# Patient Record
Sex: Female | Born: 1940 | Race: White | Hispanic: No | Marital: Married | Smoking: Former smoker
Health system: Southern US, Community
[De-identification: ages and names within clinical notes are randomized; demographics above are authoritative.]

## PROBLEM LIST (undated history)

## (undated) DIAGNOSIS — R0789 Other chest pain: Secondary | ICD-10-CM

## (undated) DIAGNOSIS — Z853 Personal history of malignant neoplasm of breast: Secondary | ICD-10-CM

## (undated) DIAGNOSIS — K219 Gastro-esophageal reflux disease without esophagitis: Secondary | ICD-10-CM

## (undated) DIAGNOSIS — E559 Vitamin D deficiency, unspecified: Secondary | ICD-10-CM

## (undated) DIAGNOSIS — R93421 Abnormal radiologic findings on diagnostic imaging of right kidney: Secondary | ICD-10-CM

## (undated) DIAGNOSIS — I700 Atherosclerosis of aorta: Secondary | ICD-10-CM

## (undated) DIAGNOSIS — I10 Essential (primary) hypertension: Secondary | ICD-10-CM

## (undated) DIAGNOSIS — G4700 Insomnia, unspecified: Secondary | ICD-10-CM

## (undated) DIAGNOSIS — I2089 Other forms of angina pectoris: Secondary | ICD-10-CM

## (undated) DIAGNOSIS — M48061 Spinal stenosis, lumbar region without neurogenic claudication: Secondary | ICD-10-CM

## (undated) DIAGNOSIS — R0609 Other forms of dyspnea: Secondary | ICD-10-CM

## (undated) DIAGNOSIS — C50919 Malignant neoplasm of unspecified site of unspecified female breast: Secondary | ICD-10-CM

## (undated) DIAGNOSIS — M7989 Other specified soft tissue disorders: Secondary | ICD-10-CM

## (undated) DIAGNOSIS — E039 Hypothyroidism, unspecified: Secondary | ICD-10-CM

## (undated) DIAGNOSIS — M1990 Unspecified osteoarthritis, unspecified site: Secondary | ICD-10-CM

## (undated) DIAGNOSIS — C801 Malignant (primary) neoplasm, unspecified: Secondary | ICD-10-CM

## (undated) DIAGNOSIS — E785 Hyperlipidemia, unspecified: Secondary | ICD-10-CM

## (undated) DIAGNOSIS — Z923 Personal history of irradiation: Secondary | ICD-10-CM

## (undated) DIAGNOSIS — E669 Obesity, unspecified: Secondary | ICD-10-CM

## (undated) HISTORY — DX: Other forms of dyspnea: R06.09

## (undated) HISTORY — DX: Spinal stenosis, lumbar region without neurogenic claudication: M48.061

## (undated) HISTORY — DX: Other forms of angina pectoris: I20.89

## (undated) HISTORY — DX: Malignant neoplasm of unspecified site of unspecified female breast: C50.919

## (undated) HISTORY — PX: JOINT REPLACEMENT: SHX530

## (undated) HISTORY — DX: Other specified soft tissue disorders: M79.89

## (undated) HISTORY — DX: Vitamin D deficiency, unspecified: E55.9

## (undated) HISTORY — DX: Personal history of malignant neoplasm of breast: Z85.3

## (undated) HISTORY — DX: Abnormal radiologic findings on diagnostic imaging of right kidney: R93.421

## (undated) HISTORY — DX: Hypothyroidism, unspecified: E03.9

## (undated) HISTORY — DX: Hyperlipidemia, unspecified: E78.5

## (undated) HISTORY — DX: Essential (primary) hypertension: I10

## (undated) HISTORY — DX: Gastro-esophageal reflux disease without esophagitis: K21.9

## (undated) HISTORY — DX: Other chest pain: R07.89

## (undated) HISTORY — DX: Unspecified osteoarthritis, unspecified site: M19.90

## (undated) HISTORY — DX: Obesity, unspecified: E66.9

## (undated) HISTORY — DX: Malignant (primary) neoplasm, unspecified: C80.1

## (undated) HISTORY — DX: Atherosclerosis of aorta: I70.0

## (undated) HISTORY — DX: Insomnia, unspecified: G47.00

---

## 1958-07-21 HISTORY — PX: ANTERIOR CRUCIATE LIGAMENT REPAIR: SHX115

## 1960-07-21 HISTORY — PX: APPENDECTOMY: SHX54

## 1969-07-21 HISTORY — PX: CHOLECYSTECTOMY: SHX55

## 1978-07-21 HISTORY — PX: TUBAL LIGATION: SHX77

## 1998-06-27 ENCOUNTER — Other Ambulatory Visit: Admission: RE | Admit: 1998-06-27 | Discharge: 1998-06-27 | Payer: Self-pay | Admitting: Obstetrics and Gynecology

## 1999-08-16 ENCOUNTER — Other Ambulatory Visit: Admission: RE | Admit: 1999-08-16 | Discharge: 1999-08-16 | Payer: Self-pay | Admitting: Obstetrics and Gynecology

## 1999-11-20 ENCOUNTER — Encounter (INDEPENDENT_AMBULATORY_CARE_PROVIDER_SITE_OTHER): Payer: Self-pay

## 1999-11-20 ENCOUNTER — Other Ambulatory Visit: Admission: RE | Admit: 1999-11-20 | Discharge: 1999-11-20 | Payer: Self-pay | Admitting: Obstetrics and Gynecology

## 2000-08-18 ENCOUNTER — Other Ambulatory Visit: Admission: RE | Admit: 2000-08-18 | Discharge: 2000-08-18 | Payer: Self-pay | Admitting: Obstetrics and Gynecology

## 2001-10-19 ENCOUNTER — Other Ambulatory Visit: Admission: RE | Admit: 2001-10-19 | Discharge: 2001-10-19 | Payer: Self-pay | Admitting: Obstetrics and Gynecology

## 2002-10-24 ENCOUNTER — Encounter: Payer: Self-pay | Admitting: Internal Medicine

## 2002-10-24 ENCOUNTER — Encounter: Admission: RE | Admit: 2002-10-24 | Discharge: 2002-10-24 | Payer: Self-pay | Admitting: Internal Medicine

## 2002-11-18 ENCOUNTER — Encounter: Admission: RE | Admit: 2002-11-18 | Discharge: 2002-11-18 | Payer: Self-pay | Admitting: Internal Medicine

## 2002-11-18 ENCOUNTER — Encounter: Payer: Self-pay | Admitting: Internal Medicine

## 2003-04-18 ENCOUNTER — Other Ambulatory Visit: Admission: RE | Admit: 2003-04-18 | Discharge: 2003-04-18 | Payer: Self-pay | Admitting: Obstetrics and Gynecology

## 2004-11-08 ENCOUNTER — Ambulatory Visit: Payer: Self-pay | Admitting: Gastroenterology

## 2004-11-19 ENCOUNTER — Ambulatory Visit: Payer: Self-pay | Admitting: Gastroenterology

## 2005-01-29 ENCOUNTER — Ambulatory Visit: Payer: Self-pay | Admitting: Gastroenterology

## 2005-10-17 ENCOUNTER — Encounter: Admission: RE | Admit: 2005-10-17 | Discharge: 2005-10-17 | Payer: Self-pay | Admitting: Pulmonary Disease

## 2005-12-26 ENCOUNTER — Ambulatory Visit: Payer: Self-pay | Admitting: Gastroenterology

## 2006-06-05 ENCOUNTER — Encounter: Admission: RE | Admit: 2006-06-05 | Discharge: 2006-06-05 | Payer: Self-pay | Admitting: Internal Medicine

## 2008-01-31 ENCOUNTER — Encounter: Admission: RE | Admit: 2008-01-31 | Discharge: 2008-01-31 | Payer: Self-pay | Admitting: Pulmonary Disease

## 2008-10-18 ENCOUNTER — Encounter (INDEPENDENT_AMBULATORY_CARE_PROVIDER_SITE_OTHER): Payer: Self-pay | Admitting: *Deleted

## 2009-03-08 ENCOUNTER — Encounter: Admission: RE | Admit: 2009-03-08 | Discharge: 2009-03-08 | Payer: Self-pay | Admitting: Pulmonary Disease

## 2009-03-08 ENCOUNTER — Ambulatory Visit: Payer: Self-pay | Admitting: Internal Medicine

## 2009-03-30 ENCOUNTER — Ambulatory Visit: Payer: Self-pay | Admitting: Internal Medicine

## 2010-01-27 ENCOUNTER — Emergency Department (HOSPITAL_COMMUNITY): Admission: EM | Admit: 2010-01-27 | Discharge: 2010-01-27 | Payer: Self-pay | Admitting: Emergency Medicine

## 2010-08-11 ENCOUNTER — Encounter: Payer: Self-pay | Admitting: Gastroenterology

## 2010-08-11 ENCOUNTER — Encounter: Payer: Self-pay | Admitting: Pulmonary Disease

## 2010-08-27 ENCOUNTER — Encounter (HOSPITAL_COMMUNITY): Payer: Medicare Other | Attending: Orthopedic Surgery

## 2010-08-27 DIAGNOSIS — Z01818 Encounter for other preprocedural examination: Secondary | ICD-10-CM | POA: Insufficient documentation

## 2010-08-27 LAB — COMPREHENSIVE METABOLIC PANEL
Alkaline Phosphatase: 61 U/L (ref 39–117)
BUN: 11 mg/dL (ref 6–23)
CO2: 29 mEq/L (ref 19–32)
Calcium: 9.8 mg/dL (ref 8.4–10.5)
Chloride: 103 mEq/L (ref 96–112)
GFR calc non Af Amer: >60 mL/min (ref >60)
Sodium: 140 mEq/L (ref 135–145)

## 2010-08-27 LAB — PROTIME-INR
INR: 0.97 (ref 0.00–1.49)
Prothrombin Time: 13.1 seconds (ref 11.6–15.2)

## 2010-08-27 LAB — URINALYSIS, ROUTINE W REFLEX MICROSCOPIC
Nitrite: NEGATIVE
Protein, ur: NEGATIVE mg/dL
Specific Gravity, Urine: 1.012 (ref 1.005–1.030)
Urine Glucose, Fasting: NEGATIVE mg/dL
Urobilinogen, UA: 0.2 mg/dL (ref 0.0–1.0)

## 2010-08-27 LAB — SURGICAL PCR SCREEN: Staphylococcus aureus: NEGATIVE

## 2010-08-27 LAB — CBC
HCT: 38.5 % (ref 36.0–46.0)
MCH: 30.9 pg (ref 26.0–34.0)
MCV: 89.5 fL (ref 78.0–100.0)
Platelets: 204 10*3/uL (ref 150–400)

## 2010-09-06 ENCOUNTER — Inpatient Hospital Stay (HOSPITAL_COMMUNITY)
Admission: RE | Admit: 2010-09-06 | Discharge: 2010-09-11 | DRG: 462 | Disposition: A | Discharge status: Skilled Nursing Facility | Payer: Medicare Other | Attending: Orthopedic Surgery | Admitting: Orthopedic Surgery

## 2010-09-06 DIAGNOSIS — D62 Acute posthemorrhagic anemia: Secondary | ICD-10-CM | POA: Diagnosis not present

## 2010-09-06 DIAGNOSIS — E871 Hypo-osmolality and hyponatremia: Secondary | ICD-10-CM | POA: Diagnosis not present

## 2010-09-06 DIAGNOSIS — M1710 Unilateral primary osteoarthritis, unspecified knee: Principal | ICD-10-CM | POA: Diagnosis present

## 2010-09-06 DIAGNOSIS — D696 Thrombocytopenia, unspecified: Secondary | ICD-10-CM | POA: Diagnosis not present

## 2010-09-06 DIAGNOSIS — E039 Hypothyroidism, unspecified: Secondary | ICD-10-CM | POA: Diagnosis present

## 2010-09-06 DIAGNOSIS — I10 Essential (primary) hypertension: Secondary | ICD-10-CM | POA: Diagnosis present

## 2010-09-06 DIAGNOSIS — E7800 Pure hypercholesterolemia, unspecified: Secondary | ICD-10-CM | POA: Diagnosis present

## 2010-09-06 HISTORY — PX: REPLACEMENT TOTAL KNEE BILATERAL: SUR1225

## 2010-09-07 LAB — CBC
HCT: 27.8 % — ABNORMAL LOW (ref 36.0–46.0)
MCH: 29.8 pg (ref 26.0–34.0)
MCV: 91.1 fL (ref 78.0–100.0)
Platelets: 123 10*3/uL — ABNORMAL LOW (ref 150–400)
RBC: 3.05 MIL/uL — ABNORMAL LOW (ref 3.87–5.11)

## 2010-09-07 LAB — BASIC METABOLIC PANEL
BUN: 15 mg/dL (ref 6–23)
GFR calc non Af Amer: >60 mL/min (ref >60)
Glucose, Bld: 182 mg/dL — ABNORMAL HIGH (ref 70–99)

## 2010-09-07 LAB — PROTIME-INR: Prothrombin Time: 14 seconds (ref 11.6–15.2)

## 2010-09-08 LAB — PREPARE RBC (CROSSMATCH)

## 2010-09-08 LAB — CBC
HCT: 20.8 % — ABNORMAL LOW (ref 36.0–46.0)
Platelets: 89 10*3/uL — ABNORMAL LOW (ref 150–400)
RBC: 2.28 MIL/uL — ABNORMAL LOW (ref 3.87–5.11)
RDW: 13.5 % (ref 11.5–15.5)

## 2010-09-08 LAB — BASIC METABOLIC PANEL
Calcium: 7.8 mg/dL — ABNORMAL LOW (ref 8.4–10.5)
Creatinine, Ser: 0.67 mg/dL (ref 0.4–1.2)
GFR calc Af Amer: >60 mL/min (ref >60)
GFR calc non Af Amer: >60 mL/min (ref >60)
Glucose, Bld: 186 mg/dL — ABNORMAL HIGH (ref 70–99)

## 2010-09-08 LAB — PROTIME-INR: Prothrombin Time: 14.7 seconds (ref 11.6–15.2)

## 2010-09-09 LAB — BASIC METABOLIC PANEL
Chloride: 100 mEq/L (ref 96–112)
Creatinine, Ser: 0.66 mg/dL (ref 0.4–1.2)
Glucose, Bld: 197 mg/dL — ABNORMAL HIGH (ref 70–99)
Potassium: 4 mEq/L (ref 3.5–5.1)
Sodium: 134 mEq/L — ABNORMAL LOW (ref 135–145)

## 2010-09-09 LAB — CBC
HCT: 27.2 % — ABNORMAL LOW (ref 36.0–46.0)
MCHC: 33.8 g/dL (ref 30.0–36.0)
Platelets: 100 10*3/uL — ABNORMAL LOW (ref 150–400)
RDW: 14 % (ref 11.5–15.5)
WBC: 7.3 10*3/uL (ref 4.0–10.5)

## 2010-09-09 LAB — TYPE AND SCREEN
ABO/RH(D): A POS
Unit division: 0

## 2010-09-09 LAB — PROTIME-INR
INR: 1.06 (ref 0.00–1.49)
Prothrombin Time: 14 seconds (ref 11.6–15.2)

## 2010-09-10 LAB — BASIC METABOLIC PANEL
BUN: 5 mg/dL — ABNORMAL LOW (ref 6–23)
CO2: 31 mEq/L (ref 19–32)
Calcium: 8.3 mg/dL — ABNORMAL LOW (ref 8.4–10.5)
Creatinine, Ser: 0.65 mg/dL (ref 0.4–1.2)
GFR calc Af Amer: >60 mL/min (ref >60)
Glucose, Bld: 163 mg/dL — ABNORMAL HIGH (ref 70–99)

## 2010-09-10 LAB — CBC
Hemoglobin: 9.5 g/dL — ABNORMAL LOW (ref 12.0–15.0)
MCH: 30.4 pg (ref 26.0–34.0)
MCHC: 33.8 g/dL (ref 30.0–36.0)
MCV: 89.8 fL (ref 78.0–100.0)

## 2010-09-10 LAB — PROTIME-INR: INR: 1.08 (ref 0.00–1.49)

## 2010-09-11 LAB — CBC
MCH: 30.2 pg (ref 26.0–34.0)
MCHC: 33.1 g/dL (ref 30.0–36.0)
MCV: 91.4 fL (ref 78.0–100.0)
Platelets: 160 10*3/uL (ref 150–400)
RBC: 3.01 MIL/uL — ABNORMAL LOW (ref 3.87–5.11)
RDW: 13.8 % (ref 11.5–15.5)

## 2010-09-11 LAB — PROTIME-INR: Prothrombin Time: 15.7 seconds — ABNORMAL HIGH (ref 11.6–15.2)

## 2010-09-11 NOTE — H&P (Signed)
NAME:  Dawn Bradley, Dawn Bradley             ACCOUNT NO.:  [id number]  MEDICAL RECORD NO.:  [id number]         PATIENT TYPE:  LINP  LOCATION:  1609                         FACILITY:  [facility]  PHYSICIAN:  Ollen Gross, M.D.    DATE OF BIRTH:  [birth year]  DATE OF ADMISSION:  09/06/2010 DATE OF DISCHARGE:                             HISTORY & PHYSICAL   CHIEF COMPLAINT:  Bilateral knee pain.  HISTORY OF PRESENT ILLNESS:  The patient is a 70 year old female who has been seen by Dr. Lequita Halt for ongoing bilateral knee pain.  The knee has been progressively getting worse for quite some time now.  She has advanced arthritis.  The left is a little worse than the right.  The right is also bone-on-bone, though.  It was felt she would be a good candidate for surgical intervention.  She was originally scheduled to have the left total knee done, however, after further discussion, she would like to have both knees done at the same time.  Risks and benefits have been discussed and she elected to proceed with surgery.  She has been seen preoperatively by Dr. Jacky Kindle and felt to be stable for surgery.  ALLERGIES:  CODEINE derivatives.  FOOD ALLERGY:  SHELLFISH (she does not have any specific allergy to Betadine or iodine though).  CURRENT MEDICATIONS:  Lisinopril, simvastatin, Synthroid, generic Ambien, multivitamin, vitamin D, and Align.  PAST MEDICAL HISTORY: 1. Past history of bronchitis. 2. Past history of pneumonia. 3. Hypertension. 4. Hypercholesterolemia. 5. History of fatty liver. 6. Hypothyroidism. 7. Postmenopausal. 8. History of renal calculi.  PAST SURGICAL HISTORY:  Gallbladder surgery; ruptured appendix, resulting peritonitis; left knee surgery; and tubal ligation.  FAMILY HISTORY:  Father deceased, age 66, kidney cancer.  Mother deceased, age 23, natural causes.  Sister with breast cancer.  SOCIAL HISTORY:  Married.  Retired Veterinary surgeon.  Past smoker.  One drink twice a week.  Three  children.  Her spouse will be assisting with care after surgery.  She lives in a two-story home, two steps entering.  She does have a living will.  REVIEW OF SYSTEMS:  GENERAL:  No fever, chills, or night sweats.  NEURO: No seizure, syncope, or paralysis.  RESPIRATORY:  No shortness breath, productive cough, or hemoptysis.  CARDIOVASCULAR:  No chest pain, angina, orthopnea.  GI:  No nausea, vomiting, diarrhea, or constipation. GU:  No dysuria, hematuria.  Occasional nocturia.  MUSCULOSKELETAL: Knee pain.  PHYSICAL EXAMINATION:  VITAL SIGNS:  Pulse 64, respirations 12, blood pressure 142/78. GENERAL:  A 70 year old white female, well nourished, well developed, overweight, in no acute distress.  She is alert, oriented cooperative, pleasant, excellent historian, accompanied by her husband. HEENT:  Normocephalic, atraumatic.  Pupils are round and reactive.  EOMs intact.  Never wore glasses. NECK:  Supple. CHEST:  Clear. HEART:  Regular rate and rhythm without murmur. ABDOMEN:  Soft, round, protuberant abdomen.  Bowel sounds present. RECTAL:  Not done, not pertinent to present illness. BREASTS:  Not done, not pertinent to present illness. GENITALIA:  Not done, not pertinent to present illness. EXTREMITIES:  Right knee, no effusion, marked crepitus, no instability. Left knee, no effusion, marked  crepitus, no instability.  IMPRESSION:  Osteoarthritis, bilateral knees.  PLAN:  The patient will be admitted to [facility] to undergo bilateral total knee replacement arthroplasty.  Surgery will be performed by Dr. Ollen Gross.     Alexzandrew L. Julien Girt, P.A.C.   ______________________________ Ollen Gross, M.D.    ALP/MEDQ  D:  09/05/2010  T:  09/06/2010  Job:  [id number]  cc:   Gerlene Burdock M.D. Jacky Kindle  Electronically Signed by Patrica Duel P.A.C. on 09/10/2010 07:48:49 AM Electronically Signed by Ollen Gross M.D. on 09/11/2010 04:48:21 PM

## 2010-09-11 NOTE — Op Note (Signed)
NAME:  Dawn Bradley, Dawn Bradley             ACCOUNT NO.:  [id number]  MEDICAL RECORD NO.:  [id number]           PATIENT TYPE:  I  LOCATION:  0005                         FACILITY:  [facility]  PHYSICIAN:  Ollen Gross, M.D.    DATE OF BIRTH:  1941-03-05  DATE OF PROCEDURE:  09/06/2010 DATE OF DISCHARGE:                              OPERATIVE REPORT   PREOPERATIVE DIAGNOSIS:  Osteoarthritis, bilateral knees.  POSTOPERATIVE DIAGNOSIS:  Osteoarthritis, bilateral knees.  PROCEDURE:  Bilateral total knee arthroplasty.  SURGEON:  Ollen Gross, MD  ASSISTANT:  Alexzandrew L. Perkins, PA-C  ANESTHESIA:  Combined spinal epidural.  ESTIMATED BLOOD LOSS:  Minimal.  DRAIN:  Autovac x1 each side.  TOURNIQUET TIME:  Right 39 minutes at 300 mmHg and left 39 minutes at 300 mmHg.  COMPLICATIONS:  None.  CONDITION:  Stable to Recovery.  BRIEF CLINICAL NOTE:  Dawn Bradley is a 70 year old female with advanced end-stage arthritis of both knees with progressively worsening pain and dysfunction.  The left has been worse for many years and has had more longstanding arthritis.  She opts for bilateral total knee arthroplasty.  We discussed doing the left first since it is more symptomatic.  PROCEDURE IN DETAIL:  After successful initiation of combined spinal and epidural anesthetic, tourniquet was placed high on both thighs.  Both lower extremities prepped and draped in usual sterile fashion.  Left lower extremity was dressed first.  The left lower extremity was wrapped in an Esmarch, knee flexed, tourniquet inflated to 300 mmHg.  Midline incision was made with a 10 blade through subcutaneous tissue to the level of the extensor mechanism.  Fresh blade was used to make a medial parapatellar arthrotomy.  Soft tissue proximal medial tibia subperiosteally elevated to the joint line with the knife and into the semimembranosus bursa with a Cobb elevator.  Soft tissue laterally was elevated with attention  being paid to avoid patellar tendon on tibial tubercle.  Patella was everted, knee flexed to 90 degrees, and ACL and PCL removed.  Drill was used to create a starting hole in the distal femur and the canal was thoroughly irrigated.  A 5-degree left valgus alignment guide was placed and distal femoral cutting block was pinned to remove 11 mm off the distal femur.  Distal femoral resection was made with an oscillating saw.  The tibia subluxed forward and menisci removed.  Extramedullary tibial alignment guide was placed, referencing proximally at the medial aspect of the tibial tubercle and distally along the 2nd metatarsal axis and tibial crest.  Block was pinned to remove 2 mm off the more deficient medial side.  There was pretty significant deficiency in this tibia. The dissection was made with an oscillating saw.  Size 4 was the most appropriate tibial component and the proximal tibia was prepared with a modular drill and keel punched for the size 4.  Femoral sizer was placed, sized 4 narrow was most appropriate on the femur.  Rotation was marked at the epicondylar axis and confirmed by creating rectangular flexion gap at 90 degrees.  Size 4 cutting block was placed in this rotation and the anterior and posterior chamfer  cuts made.  Intercondylar block was placed and that cut was made.  Trial size 4 narrow posterior stabilized femur was placed.  A 12.5 posterior stabilized rotating platform insert trial was placed.  The 12.5 had a little bit play, so we went with a 15 which allowed for full extension with excellent varus-valgus and anterior-posterior balance, throughout full range of motion.  Patella was then everted and thickness measured to be 21 mm.  Freehand resection was taken to 12 mm, 35 template was placed, lug holes were drilled, trial patella was placed and it tracked normally.  Osteophytes were removed off the posterior femur with the trial in place.  All trials were  removed and the cut bone surfaces were prepared with pulsatile lavage.  Cement was mixed and once ready for implantation, a size 4 mobile-bearing tibial tray, size 4 narrow posterior stabilized femur, and 38 patella were cemented in place. Patella was held with a clamp.  Trial 15-mm insert was placed, knee held in full extension, all extruded cement removed.  When the cement was fully hardened, then the permanent 15 mm posterior stabilized rotating platform insert was placed in the tibial tray.  The wound was copiously irrigated with saline solution and then the arthrotomy closed over Autovac drain with interrupted #1 PDS.  Flexion against gravity was about 110 degrees.  Preop, she was only 45 degrees of flexion.  The tourniquet was then released at total time of 39 minutes.  DICTATION ENDED AT THIS POINT.     Ollen Gross, M.D.     FA/MEDQ  D:  09/06/2010  T:  09/06/2010  Job:  [id number]  Electronically Signed by Ollen Gross M.D. on 09/11/2010 04:48:17 PM

## 2010-09-11 NOTE — Op Note (Signed)
NAME:  Dawn Bradley, Dawn Bradley             ACCOUNT NO.:  [id number]  MEDICAL RECORD NO.:  [id number]           PATIENT TYPE:  I  LOCATION:  0005                         FACILITY:  [facility]  PHYSICIAN:  Ollen Gross, M.D.    DATE OF BIRTH:  1940-10-01  DATE OF PROCEDURE:  09/06/2010 DATE OF DISCHARGE:                              OPERATIVE REPORT   ADDENDUM:  The flexion against gravity was about 110 degrees.  Preop flexion was only about 50-60.  Subcutaneous was then closed with interrupted 2-0 Vicryl.  A tourniquet was released at total time of 39 minutes.  Subcuticular was closed with running 4-0 Monocryl.  Incision was cleaned and dried.  We then wrapped the right lower extremity in an Esmarch, flexed the knee, and inflated tourniquet to 300 mmHg.  Midline incision was made with a 10 blade through the subcutaneous tissue to the extensor mechanism.  Fresh blade was used to make a medial parapatellar arthrotomy.  Soft tissue on the proximal medial tibia subperiosteally elevated to the joint line with a knife and into the semimembranosus bursa with a Cobb elevator.  Soft tissue laterally was elevated with attention being paid to avoid any patellar tendon on tibial tubercle. Patella was everted, knee flexed 90 degrees, ACL and PCL removed.  Drill was used to create a starting hole and the distal femur canal was thoroughly irrigated.  A 5-degree right valgus alignment guide was placed.  Distal femoral cutting block was pinned to remove 11 mm off the distal femur.  Distal femoral resection was made with an oscillating saw.  The tibia subluxed forward menisci removed.  Extramedullary tibial alignment guide was placed, referencing proximally at the medial aspect of the tibial tubercle and distally along the 2nd metatarsal axis and tibial crest.  Block was pinned to remove 2 mm off the more deficient medial side.  Deficiency was not as bad as it was on the left.  Tibial resection was made with  an oscillating saw.  Size 3 was most appropriate tibia.  The proximal tibia was prepared with a modular drill and keel punched for the size 3.  Femoral sizing guide was placed, size three was most appropriate on the femur.  Rotation was marked at the epicondylar axis and confirmed by creating a rectangular flexion gap at 90 degrees.  Size 3 cutting block was placed in this rotation and the anterior and posterior chamfer cuts were made.  Intercondylar block was placed and that cut was made.  Trial size 3 posterior stabilized femur was placed.  A 10 mm posterior stabilized rotating platform insert trial was placed.  With the 10, full extension was achieved with excellent varus-valgus and anterior- posterior balance, throughout full range of motion.  Patella was everted, thickness measured to be 22 mm.  Free resection was taken to 12 mm, 38 template was placed, lug holes were drilled, trial patella was placed and it tracked normally.  Osteophytes were removed off the posterior femur with the trial in place.  All trials were removed andcut bone surfaces  were prepared with pulsatile lavage.  Cement was mixed and once ready for implantation, a  size 3 mobile-bearing tibial tray, size 3 posterior stabilized femur, and 38 patella were cemented into place.  The patella was held with a clamp.  Trial 10-mm insert was placed, knee held in full extension, all extruded cement removed.  When the cement was fully hardened, then the permanent 10 mm posterior stabilized rotating platform insert was placed in the tibial tray. Wound was copiously irrigated with saline solution and the arthrotomy closed over the Autovac drain with interrupted #1 PDS.  Flexion against gravity was 135 degrees and patella tracked normally.  Subcutaneous was closed with interrupted 2-0 Vicryl and subcuticular with running 4-0 Monocryl.  Note that the tourniquet was released after 39 minutes once we closed the deep layer.  A 4-0  Monocryl was used to close the subcuticular layer. Drain was hooked to suction.  Incision cleaned and dried.  Steri-Strips and a bulky sterile dressing applied.  Both sterile dressing was placed on the left also.  She was then placed into knee immobilizers, awakened, and transported to Recovery in stable condition.     Ollen Gross, M.D.     FA/MEDQ  D:  09/06/2010  T:  09/06/2010  Job:  [id number]  Electronically Signed by Ollen Gross M.D. on 09/11/2010 04:48:20 PM

## 2010-09-26 NOTE — Discharge Summary (Signed)
NAME:  SHYE, DOTY             ACCOUNT NO.:  [id number]  MEDICAL RECORD NO.:  [id number]           PATIENT TYPE:  I  LOCATION:  1609                         FACILITY:  [facility]  PHYSICIAN:  Ollen Gross, M.D.    DATE OF BIRTH:  05/03/1941  DATE OF ADMISSION:  09/06/2010 DATE OF DISCHARGE:  09/11/2010                        DISCHARGE SUMMARY - REFERRING   ADMITTING DIAGNOSES: 1. Bilateral knees osteoarthritis. 2. Past history of bronchitis. 3. Past history of pneumonia. 4. Hypertension. 5. Hypercholesterolemia. 6. History of fatty liver. 7. Hypothyroidism. 8. Postmenopausal. 9. History of renal calculi.  DISCHARGE DIAGNOSES: 1. Osteoarthritis bilateral knees, status post bilateral total knee     replacement arthroplasties. 2. Postop acute blood loss anemia. 3. Status post transfusion without sequelae. 4. Postop hyponatremia, improved. 5. Past history of bronchitis. 6. Past history of pneumonia. 7. Hypertension. 8. Hypercholesterolemia. 9. History of fatty liver. 10.Hypothyroidism. 11.Postmenopausal. 12.History of renal calculi.  PROCEDURE:  September 06, 2010, bilateral total knee replacement arthroplasty.  SURGEON:  Ollen Gross, MD  ASSISTANT:  Alexzandrew L. Perkins, P.A.C  ANESTHESIA:  Spinal anesthesia with epidural placement postoperatively.  TOURNIQUET TIMES:  39 on the right and 39 on the left.  CONSULTS:  Anesthesia for epidural management.  BRIEF HISTORY:  The patient is a 70 year old female known to Dr. Lequita Halt having known bilateral knees osteoarthritis.  This has been progressive in nature and felt that she would benefit undergoing surgical intervention.  The pros and cons of having bilateral knees done were discussed with the patient and she elected to proceed with bilateral knee replacements.  LABORATORY DATA:  CBC on admission hemoglobin 13.3, hematocrit 38.5, white cell count 6.6, platelets 204.  PT, INR 13.1 and 0.97 with a PTT of 38.   Chem panel on admission, slightly elevated glucose of 127. Remaining Chem panel all within normal limits.  Preop UA negative. Blood group type A positive.  Nasal swabs were negative.  Staph aureus negative for MRSA.  Serial CBCs were followed.  Hemoglobin dropped down to 9.1 and then 6.9.  She was given 2 units of blood.  Post transfusion, hemoglobin back up to 9.2.  Last hemoglobin 9.1 and hematocrit 27.5. Serial prothrombin times followed per Coumadin protocol.  Last PT, INR 15.7 and 1.23.  Serial BMETs were followed for 4 days.  Sodium did drop from 140-134 back up to 136.  Remaining electrolytes remained within normal limits.  Glucose run at 127-197 back down to 163.  EKG dated April 16, 2008, normal sinus rhythm, normal EKG.  Chest x-ray dated September 10, 2009, no active disease.  HOSPITAL COURSE:  The patient was admitted to [facility], taken to OR, underwent above-stated procedure without complication.  The patient tolerated procedure well.  She did have an epidural placed postoperatively for postoperative management.  She was later transferred to recovery room and then orthopedic floor for continued postop care. She was also given additional p.o. and IV analgesics.  She was given 24 hours postoperative antibiotics.  Her Coumadin, Lovenox were held temporarily since she had the epidural.  On day #1, she was doing fairly well with some pain control one leg, a  little bit more soreness in the epidural castration and epidural management was controlled by Anesthesia for better pain control.  She has got up to the bed to chair.  She did have some low platelets, so we monitored that.  Platelets dropped down to 89 postoperatively.  On postop day #2, she was seen by Anesthesia and the epidural was pulled.  The Coumadin was started that night before and then she was to be started on Lovenox postoperatively, but the platelets were low, so was held on day #2.  Her sodium dropped  down to 134, felt to be more of a dilutional component.  We had decreased the fluids when she was eating and drinking well.  She had excellent urinary output.  We left the Foley a little bit longer also because of her low hemoglobin. Unfortunately, had dropped on day #2 down to a level of 6.9 requiring 2 units of blood, so she was transfused and then Foley was left in for strict I's and O's management.  She had good output, so by day #3, the Foley was discontinued and we evaluated, her platelets were stable actually up from 89-100, so we started her on Lovenox 40 mg once a day until her INR was therapeutic.  She started getting up by this time and walking about short distances of 10-30 feet.  She did want to look into a skilled nursing facility, so we got the social work involved.  FL-2 was signed and sent out on Monday and bed offers were made.  On postop day #4, her platelets were increasing up to 126.  By this time, her sodium was stable and actually on day #2 and #3 it was a little low, but by day #4 it was back up to a normal level of 136.  She continued to progress well, although she was needing moderate assist due to having both the legs done and felt she would be an excellent candidate for rehab.  She was seen on postop day #5, September 11, 2010, by Dr. Lequita Halt and bed offers were made.  We are waiting on final bed decision, but at this point she is medically stable and we decided the patient be transferred to skilled nursing facility at that time.  DISCHARGE PLAN: 1. The patient will be transferred over to skilled nursing facility of     choice. 2. Discharge diagnoses, please see above.  DISCHARGE MEDICATIONS:  Current medications at time of transfer include: 1. Coumadin protocol.  Please titrate Coumadin level for target INR     between 2.0 and 3.0.  She is to be on Coumadin for 4 weeks     following discharge. 2. Colace 100 mg p.o. b.i.d. 3. Zocor 20 mg p.o. q.h.s. 4.  Levothyroxine 88 mcg p.o. q.h.s. 5. Pepcid 20 mg daily. 6. Lovenox 40 mg subcu injection daily.  Please note to discontinue     the Lovenox once the INR is 2.0 or greater. 7. MiraLax 17 g in 8 ounces of water daily. 8. Robaxin 500 mg p.o. q.6 hours p.r.n. spasm. 9. Tylenol 325 mg one or two every 4-6 hours as needed for mild pain,     temperature, or headache. 10.Restoril 15 mg to 30 mg p.o. q.h.s. p.r.n. sleep. 11.OxyIR 10 mg 1-2 tablets every 4 hours as needed for pain.  DIET:  Heart-healthy diet.  ACTIVITY:  She is weightbearing as tolerated.  Total knee protocol to both lower extremities, weightbearing as tolerated.  She needs to continue with  gait training, ambulation, ADLs, range of motion, and strengthening exercises for both total knee procedures.  She may start showering at this point at time of transfer, but do not submerge the incision under water.  FOLLOWUP:  She needs to follow with next week with Dr. Lequita Halt on either Tuesday, February 28th or Thursday, March 1st.  Please contact the office at [phone] to help arrange appointment and followup care, transfer this patient for further followup.  DISPOSITION:  Pending at time of dictation, bed offers have been made, waiting on final choice from the patient.  CONDITION ON DISCHARGE:  Improving.     Alexzandrew L. Julien Girt, P.A.C.   ______________________________ Ollen Gross, M.D.    ALP/MEDQ  D:  09/11/2010  T:  09/11/2010  Job:  [id number]  cc:   Dr. Geoffry Paradise  Electronically Signed by Patrica Duel P.A.C. on 09/12/2010 08:45:20 AM Electronically Signed by Ollen Gross M.D. on 09/25/2010 03:44:30 PM

## 2010-12-31 ENCOUNTER — Other Ambulatory Visit: Payer: Self-pay | Admitting: Orthopedic Surgery

## 2010-12-31 ENCOUNTER — Encounter (HOSPITAL_COMMUNITY): Payer: Medicare Other

## 2010-12-31 ENCOUNTER — Ambulatory Visit (HOSPITAL_COMMUNITY)
Admission: RE | Admit: 2010-12-31 | Discharge: 2010-12-31 | Disposition: A | Discharge status: Home / Self Care | Payer: Medicare Other | Source: Ambulatory Visit | Attending: Orthopedic Surgery | Admitting: Orthopedic Surgery

## 2010-12-31 ENCOUNTER — Other Ambulatory Visit (HOSPITAL_COMMUNITY): Payer: Self-pay | Admitting: Orthopedic Surgery

## 2010-12-31 DIAGNOSIS — Z01812 Encounter for preprocedural laboratory examination: Secondary | ICD-10-CM | POA: Insufficient documentation

## 2010-12-31 DIAGNOSIS — Z01818 Encounter for other preprocedural examination: Secondary | ICD-10-CM | POA: Insufficient documentation

## 2010-12-31 DIAGNOSIS — I10 Essential (primary) hypertension: Secondary | ICD-10-CM

## 2010-12-31 DIAGNOSIS — R059 Cough, unspecified: Secondary | ICD-10-CM | POA: Insufficient documentation

## 2010-12-31 DIAGNOSIS — R05 Cough: Secondary | ICD-10-CM | POA: Insufficient documentation

## 2010-12-31 LAB — URINALYSIS, ROUTINE W REFLEX MICROSCOPIC
Glucose, UA: NEGATIVE mg/dL
Hgb urine dipstick: NEGATIVE
Ketones, ur: NEGATIVE mg/dL
Protein, ur: NEGATIVE mg/dL
pH: 5 (ref 5.0–8.0)

## 2010-12-31 LAB — CBC
MCH: 28.2 pg (ref 26.0–34.0)
MCHC: 32.2 g/dL (ref 30.0–36.0)
MCV: 87.8 fL (ref 78.0–100.0)
Platelets: 219 10*3/uL (ref 150–400)

## 2010-12-31 LAB — COMPREHENSIVE METABOLIC PANEL
ALT: 23 U/L (ref 0–35)
AST: 22 U/L (ref 0–37)
Albumin: 4.2 g/dL (ref 3.5–5.2)
CO2: 28 mEq/L (ref 19–32)
Calcium: 10.2 mg/dL (ref 8.4–10.5)
Chloride: 101 mEq/L (ref 96–112)
Creatinine, Ser: 0.65 mg/dL (ref 0.4–1.2)
GFR calc non Af Amer: >60 mL/min (ref >60)
Sodium: 140 mEq/L (ref 135–145)

## 2010-12-31 LAB — SURGICAL PCR SCREEN: Staphylococcus aureus: NEGATIVE

## 2010-12-31 LAB — URINE MICROSCOPIC-ADD ON

## 2010-12-31 LAB — PROTIME-INR: Prothrombin Time: 12.5 seconds (ref 11.6–15.2)

## 2011-01-01 ENCOUNTER — Ambulatory Visit (HOSPITAL_COMMUNITY)
Admission: RE | Admit: 2011-01-01 | Discharge: 2011-01-01 | Disposition: A | Discharge status: Home / Self Care | Payer: Medicare Other | Source: Ambulatory Visit | Attending: Orthopedic Surgery | Admitting: Orthopedic Surgery

## 2011-01-01 DIAGNOSIS — Z01812 Encounter for preprocedural laboratory examination: Secondary | ICD-10-CM | POA: Insufficient documentation

## 2011-01-01 DIAGNOSIS — I10 Essential (primary) hypertension: Secondary | ICD-10-CM | POA: Insufficient documentation

## 2011-01-01 DIAGNOSIS — Z96659 Presence of unspecified artificial knee joint: Secondary | ICD-10-CM | POA: Insufficient documentation

## 2011-01-01 DIAGNOSIS — Z01818 Encounter for other preprocedural examination: Secondary | ICD-10-CM | POA: Insufficient documentation

## 2011-01-01 DIAGNOSIS — E119 Type 2 diabetes mellitus without complications: Secondary | ICD-10-CM | POA: Insufficient documentation

## 2011-01-01 DIAGNOSIS — M24669 Ankylosis, unspecified knee: Secondary | ICD-10-CM | POA: Insufficient documentation

## 2011-01-01 LAB — GLUCOSE, CAPILLARY: Glucose-Capillary: 103 mg/dL — ABNORMAL HIGH (ref 70–99)

## 2011-01-13 NOTE — Op Note (Signed)
  NAMEAMAZIAH, GHOSH NO.:  [id number]  MEDICAL RECORD NO.:  [id number]  LOCATION:  DAYL                         FACILITY:  [facility]  PHYSICIAN:  Ollen Gross, M.D.    DATE OF BIRTH:  09/11/1940  DATE OF PROCEDURE:  01/01/2011 DATE OF DISCHARGE:                              OPERATIVE REPORT   PREOPERATIVE DIAGNOSIS:  Arthrofibrosis, bilateral knees.  POSTOPERATIVE DIAGNOSIS:  Arthrofibrosis, bilateral knees.  PROCEDURE:  Bilateral knee close manipulation.  SURGEON:  Ollen Gross, M.D.  ASSISTANT:  None.  ANESTHESIA:  General.  Pre manipulation range of motion on the right 0 to 105, left 0 to 90; post manipulation range of motion on the right 0 to 130, left 0 to 115.  COMPLICATIONS:  None.  CONDITION:  Stable to recovery.  BRIEF CLINICAL NOTE:  Ms. Casanas is a 70 year old female who had bilateral total knee arthroplasty done about 3 months ago.  She has done extremely well.  She still has some limitations of motion in the knees. To achieve maximal functional result, we elected to perform closed manipulations at this time in an attempt to gain more motion and improve her function.  PROCEDURE IN DETAIL:  After successful administration of general anesthetic, exam under anesthesia was performed showing range of motion on the right knee 0 to 105 and left 0 to 90.  I started with the right knee.  Placed my chest against her right proximal leg and flexed the knee with audible lysis of adhesions, easily we got her to 130 degrees and achieved full extension.  Patellar mobility was excellent.  We then proceeded to the left.  I placed my chest on the left proximal leg and flexed the knee with audible lysis of adhesions.  I was able to safely get her to 115 degrees.  I was able also to maintain the full extension and improve her patellar mobility. She subsequently was awakened and transported to recovery in stable condition.     Ollen Gross,  M.D.     FA/MEDQ  D:  01/01/2011  T:  01/01/2011  Job:  [id number]  Electronically Signed by Ollen Gross M.D. on 01/13/2011 05:01:48 PM

## 2011-05-20 ENCOUNTER — Other Ambulatory Visit: Payer: Self-pay

## 2011-05-21 ENCOUNTER — Other Ambulatory Visit: Payer: Self-pay | Admitting: Radiology

## 2011-05-21 DIAGNOSIS — C50912 Malignant neoplasm of unspecified site of left female breast: Secondary | ICD-10-CM

## 2011-05-23 ENCOUNTER — Ambulatory Visit
Admission: RE | Admit: 2011-05-23 | Discharge: 2011-05-23 | Disposition: A | Discharge status: Home / Self Care | Payer: Medicare Other | Source: Ambulatory Visit | Attending: Radiology | Admitting: Radiology

## 2011-05-23 DIAGNOSIS — C50912 Malignant neoplasm of unspecified site of left female breast: Secondary | ICD-10-CM

## 2011-05-23 MED ORDER — GADOBENATE DIMEGLUMINE 529 MG/ML IV SOLN
20.0000 mL | Freq: Once | INTRAVENOUS | Status: AC | PRN
  Administered 2011-05-23: 20 mL via INTRAVENOUS

## 2011-05-24 ENCOUNTER — Other Ambulatory Visit: Payer: Self-pay | Admitting: Radiology

## 2011-05-24 DIAGNOSIS — C50912 Malignant neoplasm of unspecified site of left female breast: Secondary | ICD-10-CM

## 2011-05-26 ENCOUNTER — Other Ambulatory Visit: Payer: Self-pay | Admitting: *Deleted

## 2011-05-26 DIAGNOSIS — D0510 Intraductal carcinoma in situ of unspecified breast: Secondary | ICD-10-CM

## 2011-05-27 ENCOUNTER — Ambulatory Visit
Admission: RE | Admit: 2011-05-27 | Discharge: 2011-05-27 | Disposition: A | Discharge status: Home / Self Care | Payer: Medicare Other | Source: Ambulatory Visit | Attending: Radiology | Admitting: Radiology

## 2011-05-27 DIAGNOSIS — C50912 Malignant neoplasm of unspecified site of left female breast: Secondary | ICD-10-CM

## 2011-05-27 MED ORDER — GADOBENATE DIMEGLUMINE 529 MG/ML IV SOLN
20.0000 mL | Freq: Once | INTRAVENOUS | Status: AC | PRN
  Administered 2011-05-27: 20 mL via INTRAVENOUS

## 2011-05-28 ENCOUNTER — Ambulatory Visit: Payer: Medicare Other

## 2011-05-28 ENCOUNTER — Ambulatory Visit: Payer: Medicare Other | Attending: General Surgery | Admitting: Physical Therapy

## 2011-05-28 ENCOUNTER — Ambulatory Visit (HOSPITAL_BASED_OUTPATIENT_CLINIC_OR_DEPARTMENT_OTHER): Payer: Medicare Other | Admitting: Oncology

## 2011-05-28 ENCOUNTER — Other Ambulatory Visit (HOSPITAL_BASED_OUTPATIENT_CLINIC_OR_DEPARTMENT_OTHER): Payer: Medicare Other | Admitting: Lab

## 2011-05-28 ENCOUNTER — Ambulatory Visit (HOSPITAL_BASED_OUTPATIENT_CLINIC_OR_DEPARTMENT_OTHER): Payer: Medicare Other | Admitting: General Surgery

## 2011-05-28 ENCOUNTER — Encounter (INDEPENDENT_AMBULATORY_CARE_PROVIDER_SITE_OTHER): Payer: Self-pay | Admitting: General Surgery

## 2011-05-28 ENCOUNTER — Encounter: Payer: Self-pay | Admitting: Oncology

## 2011-05-28 ENCOUNTER — Ambulatory Visit
Admission: RE | Admit: 2011-05-28 | Discharge: 2011-05-28 | Disposition: A | Discharge status: Home / Self Care | Payer: Medicare Other | Source: Ambulatory Visit | Attending: Radiation Oncology | Admitting: Radiation Oncology

## 2011-05-28 VITALS — BP 138/86 | HR 71 | Temp 98.0°F | Ht 63.0 in | Wt 212.6 lb

## 2011-05-28 DIAGNOSIS — IMO0001 Reserved for inherently not codable concepts without codable children: Secondary | ICD-10-CM | POA: Insufficient documentation

## 2011-05-28 DIAGNOSIS — Z170 Estrogen receptor positive status [ER+]: Secondary | ICD-10-CM

## 2011-05-28 DIAGNOSIS — D0590 Unspecified type of carcinoma in situ of unspecified breast: Secondary | ICD-10-CM

## 2011-05-28 DIAGNOSIS — R293 Abnormal posture: Secondary | ICD-10-CM | POA: Insufficient documentation

## 2011-05-28 DIAGNOSIS — C50919 Malignant neoplasm of unspecified site of unspecified female breast: Secondary | ICD-10-CM | POA: Insufficient documentation

## 2011-05-28 DIAGNOSIS — D0510 Intraductal carcinoma in situ of unspecified breast: Secondary | ICD-10-CM

## 2011-05-28 DIAGNOSIS — M25619 Stiffness of unspecified shoulder, not elsewhere classified: Secondary | ICD-10-CM | POA: Insufficient documentation

## 2011-05-28 DIAGNOSIS — Z510 Encounter for antineoplastic radiation therapy: Secondary | ICD-10-CM | POA: Insufficient documentation

## 2011-05-28 LAB — COMPREHENSIVE METABOLIC PANEL
AST: 26 U/L (ref 0–37)
Albumin: 4.4 g/dL (ref 3.5–5.2)
BUN: 17 mg/dL (ref 6–23)
CO2: 27 mEq/L (ref 19–32)
Calcium: 10.9 mg/dL — ABNORMAL HIGH (ref 8.4–10.5)
Chloride: 101 mEq/L (ref 96–112)
Creatinine, Ser: 0.7 mg/dL (ref 0.50–1.10)
Glucose, Bld: 113 mg/dL — ABNORMAL HIGH (ref 70–99)
Potassium: 4.8 mEq/L (ref 3.5–5.3)

## 2011-05-28 LAB — CBC WITH DIFFERENTIAL/PLATELET
Basophils Absolute: 0 10*3/uL (ref 0.0–0.1)
Eosinophils Absolute: 0.2 10*3/uL (ref 0.0–0.5)
HCT: 38.9 % (ref 34.8–46.6)
HGB: 13.3 g/dL (ref 11.6–15.9)
MCH: 31 pg (ref 25.1–34.0)
MONO#: 0.7 10*3/uL (ref 0.1–0.9)
NEUT#: 3.4 10*3/uL (ref 1.5–6.5)
NEUT%: 54.7 % (ref 38.4–76.8)
RDW: 13.8 % (ref 11.2–14.5)
lymph#: 2 10*3/uL (ref 0.9–3.3)

## 2011-05-28 NOTE — Progress Notes (Signed)
Dawn Bradley is an 70 y.o. female.    Chief Complaint  Patient presents with  . Breast Cancer    HPI: Dawn Bradley is a 70 year old [place] woman who underwent routine screening mammography at [facility] 05/13/2011 new linear microcalcifications were noted in the posterior mid left breast and she was brought back for additional imaging October 25 this showed a 2 cm cluster of pleomorphic calcifications with associated mass or architectural distortion biopsy was performed October 30 and showed ([id number]) ductal carcinoma in situ, grade 3, strongly estrogen receptor positive at 99% also progesterone receptor positive at 72%.  With this information the patient was referred to Dr. Carolynne Edouard and bilateral breast MRIs were obtained at [facility] imaging 05/27/2011 this showed an area of non-masslike clumped linear enhancement centrally and posteriorly in the left breast measuring 3.9 cm there were no other enhancing lesions and no findings in the right breast of any concern there was no evidence of adenopathy  With this information the patient presents for further evaluation and treatment  Past Medical History  Diagnosis Date  . GERD (gastroesophageal reflux disease)   . Hypertension   . Hyperlipidemia   . Hypothyroid   . Osteoarthritis     Past Surgical History  Procedure Date  . Appendectomy 1962  . Cholecystectomy 1971  . Anterior cruciate ligament repair 1960  . Tubal ligation 1980  . Replacement total knee bilateral 09/06/10    Family History  Problem Relation Age of Onset  . 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 She does not have any smokeless tobacco history on file. She reports that she drinks alcohol approximately 1 glass of wine daily she has advanced directives in place  Health maintenance:  Cholesterol under treatment  Bone density normal 2011  Colonoscopy normal 2008  (PAP) 2010   Allergies:  Allergies  Allergen Reactions  . Shellfish Allergy Hives and Swelling  . Codeine     REACTION: heart races  . Tramadol     No current outpatient prescriptions on file as of 05/28/2011.   Medications Prior to Admission  Medication Dose Route Frequency Provider Last Rate Last Dose  . gadobenate dimeglumine (MULTIHANCE) injection 20 mL  20 mL Intravenous Once PRN Medication Radiologist   20 mL at 05/27/11 1120    ROS review of systems is essentially negative except for problems unrelated to the breast cancer certainly there are no symptoms suggestive of metastatic disease she did have bilateral knee replacements in the past and still has some problems walking related to that walking however is her to form of exercise and her goal is to walk about 30 minutes at least 3 times a week otherwise aside  from some night sweats a detailed review of systems was entirely noncontributory  Physical Exam:  Blood pressure 138/86, pulse 71, temperature 98 F (36.7 C), height 5\' 3"  (1.6 m), weight 212 lb 9.6 oz (96.435 kg).  Sclerae unicteric Oropharynx clear No peripheral adenopathy Lungs no rales or rhonchi Heart regular rate and rhythm Abd benign MSK no focal spinal tenderness, no peripheral  edema Neuro: nonfocal Breasts: Right breast no suspicious findings left breast I do not palpate a definite mass in the left breast there are no significant skin changes there is no nipple retraction  CBC Lab Results  Component Value Date   WBC 5.5 12/31/2010   HGB 12.7 12/31/2010   HCT 39.5 12/31/2010   MCV 87.8 12/31/2010   PLT 219 12/31/2010   CMP     Component Value Date/Time   NA 140 12/31/2010 0945   K 4.3 12/31/2010 0945   CL 101 12/31/2010 0945   CO2 28 12/31/2010 0945   GLUCOSE 140* 12/31/2010 0945   BUN 13 12/31/2010 0945   CREATININE 0.65 12/31/2010 0945   CALCIUM 10.2 12/31/2010 0945   PROT 7.5 12/31/2010 0945   ALBUMIN 4.2 12/31/2010 0945   AST 22 12/31/2010 0945   ALT 23 12/31/2010 0945   ALKPHOS 77 12/31/2010 0945   BILITOT 0.3 12/31/2010 0945   GFRNONAA >60 12/31/2010 0945   GFRAA >60 12/31/2010 0945     Mr Breast Bilateral W Wo Contrast  05/27/2011  *RADIOLOGY REPORT*  Clinical Data: 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 Preoperative evaluation.  BUN and creatinine were obtained on site at [facility] at [address] Results:  BUN 13 mg/dL,  Creatinine 0.7 mg/dL.  BILATERAL BREAST MRI WITH AND WITHOUT CONTRAST  Technique: Multiplanar, multisequence MR images of both breasts were obtained prior to and following the intravenous administration of 20ml of Multihance.  Three dimensional images were evaluated at the independent DynaCad workstation.  Comparison:  Prior mammograms from [facility] imaging dated 05/20/2011, 05/15/2011, 05/13/2011, 05/08/2010, 05/01/2009, and 04/27/2008.  Findings: There is an area of non mass-like clumped linear enhancement located centrally and posteriorly within the left breast which corresponds to the recently biopsied area of DCIS. There is central clip artifact associated with this.  The area of enhancement measures 3.9 x 1.3 x 1.0 cm in size.  There are no other worrisome enhancing lesions within the left breast and there are no  worrisome enhancing lesions within the right breast.  There is no axillary or internal mammary adenopathy and there are no additional findings.  IMPRESSION: Area of clumped linear enhancement measuring 3.9 x 1.3 x 1.0 cm in size located centrally and posteriorly within the left breast corresponding to the area of known DCIS and associated with central clip artifact.  No additional findings.  THREE-DIMENSIONAL MR IMAGE RENDERING ON INDEPENDENT WORKSTATION:  Three-dimensional MR images were rendered by post-processing of the original MR data on an independent workstation.  The three- dimensional MR images were interpreted, and findings were reported in the accompanying complete MRI report for this study.  Original Report Authenticated By: Rolla Plate, M.D.     Assessment: 70 year old [place] woman status post left breast biopsy October 20 12th for a ductal carcinoma in situ, high grade, strongly estrogen and progesterone receptor positive  Plan: We spent the better part of her one hour visit today discussing treatment options she understands noninvasive breast cancer is not in itself life-threatening and this makes her choice is more open. We discussed whether  she should have a sentinel lymph node biopsy at the time of her original surgery especially given the fact that the area on MRI is greater than 2 cm however after much discussion which she really wants to do is simply go with the lumpectomy and no sentinel lymph node biopsy. (I have discussed this with Dr. Carolynne Edouard). She does agree that if there is an invasive component she would go through lymph node biopsy as a second procedure.  She is going to return to see me early December we should have all that pathology by then and we will decide if she is a candidate for be 55 (if she is she would be interested in participating). We also discussed anti-estrogen therapy and that'll be an option for her after she completes radiation if she wants to do  everything to keep not only this breast cancer from recurring but also from developing a new breast cancer in either breast.  Dawn Bradley Risk C 05/28/2011, 3:52 PM

## 2011-05-28 NOTE — Progress Notes (Signed)
Subjective:     Patient ID: Dawn Bradley, female   DOB: [birth year], 70 y.o.   MRN: [id number]  HPI We're asked to see the patient in consultation by Dr. Zella Richer to evaluate her for a left breast cancer. The patient is a 70 year old white female who recently went for a screening mammogram. At that time she was found to have some calcifications centrally in the left breast. 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 She was ER PR positive. She denied any breast pain. She denied any discharge from her nipple. She did note some intermittent tingling of the left breast for the last few years. she's otherwise been in good health.  Review of Systems  Constitutional: Negative.   HENT: Negative.   Eyes: Negative.   Respiratory: Negative.   Cardiovascular: Negative.   Gastrointestinal: Negative.   Genitourinary: Negative.   Musculoskeletal: Negative.   Skin: Negative.   Neurological: Negative.   Hematological: Negative.   Psychiatric/Behavioral: Negative.        Objective:   Physical Exam  Constitutional: She is oriented to person, place, and time. She appears well-developed and well-nourished.  HENT:  Head: Normocephalic and atraumatic.  Eyes: Conjunctivae and EOM are normal. Pupils are equal, round, and reactive to light.  Neck: Normal range of motion. Neck supple.  Cardiovascular: Normal rate, regular rhythm and normal heart sounds.   Pulmonary/Chest: Effort normal and breath sounds normal.       No palpable mass in either breast. No axillary supraclavicular cervical lymphadenopathy.  Abdominal: Soft. Bowel sounds are normal. She exhibits no mass. There is no tenderness.  Musculoskeletal: Normal range of motion.  Lymphadenopathy:    She has no cervical adenopathy.  Neurological: She is alert and oriented to person, place, and time.  Skin: Skin is warm and dry.  Psychiatric: She has a normal mood and affect. Her behavior is normal.       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 I've discussed the options for surgery with her including the risks and benefits as well as some of the technical aspects and she understands and wishes to proceed. She is very interested in breast conservation. She is also a good candidate for sentinel node mapping. She understands that if her margins are not clean that she may require a mastectomy.    Plan:     Plan for left breast needle localized lumpectomy. At this point she is unsure as to whether she will agree to the sentinel node mapping.

## 2011-05-28 NOTE — Patient Instructions (Signed)
Plan for left breast needle localized lumpectomy and possible sentinel node mapping

## 2011-05-28 NOTE — Progress Notes (Signed)
[facility] Radiation Oncology NEW PATIENT EVALUATION  Name: MILLIANNA SZYMBORSKI MRN: [id number]  Date: 05/28/2011  DOB: July 21, 1941  Status:outpatient    WU:[id number],NWGNFAO A, MD, MD  Robyne Askew, MD    REFERRING PHYSICIAN: Robyne Askew, MD   DIAGNOSIS: There were no encounter diagnoses. No matching staging information was found for the patient.   HISTORY OF PRESENT ILLNESS::Lizandra P Ortloff is a 70 y.o. female who is seen today at the [facility] for evaluation of her DCIS of the left breast. At the time of this screening mammogram on 05/13/2011, at [place], Darl Pikes had linear microcalcifications located in the posterior mid breast. Additional images were obtained on 05/15/2011 showing a 2.0 x 0.8 x 1.3 cm area of clustered pleomorphic calcifications. There was no associated mass. A stereotactic core biopsy on 05/20/2011 was diagnostic for DCIS with calcifications. There was also a radial sclerosing lesion. Her DCIS was ER and PR positive. Breast MRI showed an area of enhancement measuring 3.9 x 1.3 x 1.0 cm in size. There were no other suspicious lesions. I should mention that the area of her calcifications on her additional mammographic imaging showed the calcifications to be along the lower inner quadrant at 7:00.    PREVIOUS RADIATION THERAPY: No   PAST MEDICAL HISTORY:  has no past medical history on file.     PAST SURGICAL HISTORY: Past Surgical History  Procedure Date  . Appendectomy 1962  . Cholecystectomy 1971  . Anterior cruciate ligament repair 1960  . Tubal ligation 1980  . Replacement total knee bilateral 09/06/10    FAMILY HISTORY: family history includes Breast cancer in her sister.   SOCIAL HISTORY:  reports that she has never smoked. She does not have any smokeless tobacco history on file. She reports that she drinks alcohol.   ALLERGIES: Shellfish allergy; Codeine; and Tramadol   MEDICATIONS:Current outpatient prescriptions:diazepam  (VALIUM) 1 MG/ML solution, Take 1 mg by mouth every 8 (eight) hours as needed.  , Disp: , Rfl: ;  ergocalciferol (DRISDOL) 8000 UNIT/ML drops, Take 10,000 Units by mouth once a week.  , Disp: , Rfl: ;  Levothyroxine Sodium (SYNTHROID PO), Take 10 mcg by mouth daily.  , Disp: , Rfl: ;  lisinopril (PRINIVIL,ZESTRIL) 10 MG tablet, Take 10 mg by mouth daily.  , Disp: , Rfl:  simvastatin (ZOCOR) 10 MG tablet, Take 10 mg by mouth at bedtime.  , Disp: , Rfl: ;  zolpidem (AMBIEN) 5 MG tablet, Take 5 mg by mouth at bedtime as needed.  , Disp: , Rfl:    REVIEW OF SYSTEMS:  Complete ROS negative except for night sweats.    PHYSICAL EXAM: Alert and oriented white female appearing younger than her stated age. Vital signs pending at the time of this dictation. Head and neck examination grossly unremarkable. Nodes: Without palpable cervical, supraclavicular, or axillary lymphadenopathy. Chest: Lungs clear. Back: Without palpable spinal discomfort. Heart: Regular rate and rhythm. Breasts there is a punctate needle biopsy wound along the upper inner quadrant of the left breast at approximately 10:00. There are no breast masses on palpation. Abdomen without hepatomegaly. Extremities without edema. Neurologic examination grossly nonfocal.    LABORATORY DATA:  No results found for this or any previous visit (from the past 48 hour(s)).     RADIOGRAPHY: Mr Breast Bilateral W Wo Contrast  05/27/2011  *RADIOLOGY REPORT*  Clinical Data: 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 Preoperative evaluation.  BUN and creatinine were obtained on  site at [facility] at [address] Results:  BUN 13 mg/dL,  Creatinine 0.7 mg/dL.  BILATERAL BREAST MRI WITH AND WITHOUT CONTRAST  Technique: Multiplanar, multisequence MR images of both breasts were obtained prior to and following the intravenous administration of 20ml of Multihance.  Three dimensional images were evaluated at the independent DynaCad workstation.   Comparison:  Prior mammograms from [facility] imaging dated 05/20/2011, 05/15/2011, 05/13/2011, 05/08/2010, 05/01/2009, and 04/27/2008.  Findings: There is an area of non mass-like clumped linear enhancement located centrally and posteriorly within the left breast which corresponds to the recently biopsied area of DCIS. There is central clip artifact associated with this.  The area of enhancement measures 3.9 x 1.3 x 1.0 cm in size.  There are no other worrisome enhancing lesions within the left breast and there are no worrisome enhancing lesions within the right breast.  There is no axillary or internal mammary adenopathy and there are no additional findings.  IMPRESSION: Area of clumped linear enhancement measuring 3.9 x 1.3 x 1.0 cm in size located centrally and posteriorly within the left breast corresponding to the area of known DCIS and associated with central clip artifact.  No additional findings.  THREE-DIMENSIONAL MR IMAGE RENDERING ON INDEPENDENT WORKSTATION:  Three-dimensional MR images were rendered by post-processing of the original MR data on an independent workstation.  The three- dimensional MR images were interpreted, and findings were reported in the accompanying complete MRI report for this study.  Original Report Authenticated By: Rolla Plate, M.D.       IMPRESSION: DCIS of the left breast. I explained to the patient and her husband that her management options include partial mastectomy followed by radiation therapy or mastectomy. A sentinel lymph node biopsy would be optional based on her area of disease involvement and nuclear grade. We also discussed repeating her mammogram following surgery to confirm removal of all suspicious microcalcifications. We also discussed the B43 Herceptin trial, and this will be discussed further by Dr. Darnelle Catalan. I discussed the potential acute and late toxicities of radiation therapy, and she desires breast preservation. She may be a candidate for deep  inspiration and breath-hold for her left breast tangent fields. She will see Dr. Carolynne Edouard for scheduling her for surgery. Her prognosis is excellent.   PLAN: As discussed above  I spent 40 minutes minutes face to face with the patient and more than 50% of that time was spent in counseling and/or coordination of care.

## 2011-05-28 NOTE — Progress Notes (Signed)
Addended by: Caleen Essex III on: 05/28/2011 03:51 PM   Modules accepted: Orders

## 2011-05-29 ENCOUNTER — Other Ambulatory Visit (INDEPENDENT_AMBULATORY_CARE_PROVIDER_SITE_OTHER): Payer: Self-pay | Admitting: General Surgery

## 2011-05-30 ENCOUNTER — Telehealth (INDEPENDENT_AMBULATORY_CARE_PROVIDER_SITE_OTHER): Payer: Self-pay | Admitting: General Surgery

## 2011-06-03 ENCOUNTER — Encounter (HOSPITAL_BASED_OUTPATIENT_CLINIC_OR_DEPARTMENT_OTHER): Payer: Self-pay | Admitting: *Deleted

## 2011-06-03 NOTE — Progress Notes (Signed)
Pt out of town until 11/19-called several times-she was concerned about node bx-told her nuc med was sch by dr Carolynne Edouard- To come in for preop lab and possibly ekg

## 2011-06-04 ENCOUNTER — Other Ambulatory Visit (INDEPENDENT_AMBULATORY_CARE_PROVIDER_SITE_OTHER): Payer: Self-pay | Admitting: General Surgery

## 2011-06-05 ENCOUNTER — Telehealth: Payer: Self-pay | Admitting: *Deleted

## 2011-06-05 ENCOUNTER — Encounter: Payer: Self-pay | Admitting: *Deleted

## 2011-06-05 ENCOUNTER — Other Ambulatory Visit (INDEPENDENT_AMBULATORY_CARE_PROVIDER_SITE_OTHER): Payer: Self-pay | Admitting: General Surgery

## 2011-06-05 DIAGNOSIS — C50912 Malignant neoplasm of unspecified site of left female breast: Secondary | ICD-10-CM

## 2011-06-05 NOTE — Progress Notes (Signed)
Pt is scheduled for surgery on 06/11/11.

## 2011-06-05 NOTE — Telephone Encounter (Signed)
Left VM for pt to return call regarding [facility] from 05/28/11.

## 2011-06-05 NOTE — Progress Notes (Signed)
Mailed after appt letter to pt. 

## 2011-06-06 ENCOUNTER — Telehealth: Payer: Self-pay | Admitting: *Deleted

## 2011-06-06 NOTE — Telephone Encounter (Signed)
Spoke to pt concerning [facility] from 05/28/11.  Pt asked if she could take a Valium prior to needle loc on day of surgery.  Referred pt to [facility] and gave name and number of Dr. Billey Chang nurse.  Pt denies any questions or needs from clinic.  Encourage pt to call with needs.  Received verbal understanding.  Contact information given.

## 2011-06-10 ENCOUNTER — Encounter (HOSPITAL_BASED_OUTPATIENT_CLINIC_OR_DEPARTMENT_OTHER)
Admission: RE | Admit: 2011-06-10 | Discharge: 2011-06-10 | Disposition: A | Discharge status: Home / Self Care | Payer: Medicare Other | Source: Ambulatory Visit | Attending: General Surgery | Admitting: General Surgery

## 2011-06-10 LAB — BASIC METABOLIC PANEL
CO2: 26 mEq/L (ref 19–32)
Chloride: 101 mEq/L (ref 96–112)
Potassium: 4.1 mEq/L (ref 3.5–5.1)
Sodium: 138 mEq/L (ref 135–145)

## 2011-06-11 ENCOUNTER — Encounter (HOSPITAL_BASED_OUTPATIENT_CLINIC_OR_DEPARTMENT_OTHER): Payer: Self-pay | Admitting: Certified Registered Nurse Anesthetist

## 2011-06-11 ENCOUNTER — Other Ambulatory Visit (INDEPENDENT_AMBULATORY_CARE_PROVIDER_SITE_OTHER): Payer: Self-pay | Admitting: General Surgery

## 2011-06-11 ENCOUNTER — Encounter (HOSPITAL_BASED_OUTPATIENT_CLINIC_OR_DEPARTMENT_OTHER): Payer: Self-pay | Admitting: *Deleted

## 2011-06-11 ENCOUNTER — Ambulatory Visit (HOSPITAL_BASED_OUTPATIENT_CLINIC_OR_DEPARTMENT_OTHER)
Admission: RE | Admit: 2011-06-11 | Discharge: 2011-06-11 | Disposition: A | Discharge status: Home / Self Care | Payer: Medicare Other | Source: Ambulatory Visit | Attending: General Surgery | Admitting: General Surgery

## 2011-06-11 ENCOUNTER — Encounter (HOSPITAL_BASED_OUTPATIENT_CLINIC_OR_DEPARTMENT_OTHER): Payer: Self-pay

## 2011-06-11 ENCOUNTER — Encounter (HOSPITAL_BASED_OUTPATIENT_CLINIC_OR_DEPARTMENT_OTHER)
Admission: RE | Disposition: A | Discharge status: Home / Self Care | Payer: Self-pay | Source: Ambulatory Visit | Attending: General Surgery

## 2011-06-11 ENCOUNTER — Ambulatory Visit (HOSPITAL_BASED_OUTPATIENT_CLINIC_OR_DEPARTMENT_OTHER): Payer: Medicare Other | Admitting: *Deleted

## 2011-06-11 ENCOUNTER — Ambulatory Visit (HOSPITAL_COMMUNITY)
Admission: RE | Admit: 2011-06-11 | Discharge: 2011-06-11 | Disposition: A | Discharge status: Home / Self Care | Payer: Medicare Other | Source: Ambulatory Visit | Attending: General Surgery | Admitting: General Surgery

## 2011-06-11 DIAGNOSIS — Z01812 Encounter for preprocedural laboratory examination: Secondary | ICD-10-CM | POA: Insufficient documentation

## 2011-06-11 DIAGNOSIS — I10 Essential (primary) hypertension: Secondary | ICD-10-CM | POA: Insufficient documentation

## 2011-06-11 DIAGNOSIS — C50912 Malignant neoplasm of unspecified site of left female breast: Secondary | ICD-10-CM

## 2011-06-11 DIAGNOSIS — C50919 Malignant neoplasm of unspecified site of unspecified female breast: Secondary | ICD-10-CM

## 2011-06-11 DIAGNOSIS — D0590 Unspecified type of carcinoma in situ of unspecified breast: Secondary | ICD-10-CM | POA: Insufficient documentation

## 2011-06-11 DIAGNOSIS — K219 Gastro-esophageal reflux disease without esophagitis: Secondary | ICD-10-CM | POA: Insufficient documentation

## 2011-06-11 HISTORY — PX: BREAST LUMPECTOMY: SHX2

## 2011-06-11 LAB — POCT HEMOGLOBIN-HEMACUE: Hemoglobin: 13.6 g/dL (ref 12.0–15.0)

## 2011-06-11 SURGERY — BREAST LUMPECTOMY
Anesthesia: General | Laterality: Left

## 2011-06-11 MED ORDER — METOCLOPRAMIDE HCL 5 MG/ML IJ SOLN
INTRAMUSCULAR | Status: DC | PRN
  Administered 2011-06-11: 10 mg via INTRAVENOUS

## 2011-06-11 MED ORDER — LIDOCAINE HCL (CARDIAC) 20 MG/ML IV SOLN
INTRAVENOUS | Status: DC | PRN
  Administered 2011-06-11: 100 mg via INTRAVENOUS

## 2011-06-11 MED ORDER — KETOROLAC TROMETHAMINE 30 MG/ML IJ SOLN: 15.0000 mg | Freq: Once | INTRAMUSCULAR | Status: DC | PRN

## 2011-06-11 MED ORDER — ACETAMINOPHEN 10 MG/ML IV SOLN
1000.0000 mg | Freq: Once | INTRAVENOUS | Status: AC
  Administered 2011-06-11: 1000 mg via INTRAVENOUS

## 2011-06-11 MED ORDER — PROMETHAZINE HCL 25 MG/ML IJ SOLN
6.2500 mg | Freq: Once | INTRAMUSCULAR | Status: AC
  Administered 2011-06-11: 6.25 mg via INTRAVENOUS

## 2011-06-11 MED ORDER — SODIUM CHLORIDE 0.9 % IJ SOLN
INTRAMUSCULAR | Status: DC | PRN
  Administered 2011-06-11: 13:00:00

## 2011-06-11 MED ORDER — ONDANSETRON HCL 4 MG/2ML IJ SOLN
INTRAMUSCULAR | Status: DC | PRN
  Administered 2011-06-11: 4 mg via INTRAVENOUS

## 2011-06-11 MED ORDER — FENTANYL CITRATE 0.05 MG/ML IJ SOLN
INTRAMUSCULAR | Status: DC | PRN
  Administered 2011-06-11 (×2): 25 ug via INTRAVENOUS
  Administered 2011-06-11: 50 ug via INTRAVENOUS

## 2011-06-11 MED ORDER — MIDAZOLAM HCL 2 MG/2ML IJ SOLN
0.5000 mg | INTRAMUSCULAR | Status: DC | PRN
  Administered 2011-06-11: 2 mg via INTRAVENOUS

## 2011-06-11 MED ORDER — PROPOFOL 10 MG/ML IV EMUL
INTRAVENOUS | Status: DC | PRN
  Administered 2011-06-11: 200 mg via INTRAVENOUS

## 2011-06-11 MED ORDER — SODIUM CHLORIDE 0.9 % IR SOLN
Status: DC | PRN
  Administered 2011-06-11: 500 mL

## 2011-06-11 MED ORDER — FENTANYL CITRATE 0.05 MG/ML IJ SOLN
25.0000 ug | INTRAMUSCULAR | Status: DC | PRN
  Administered 2011-06-11 (×4): 25 ug via INTRAVENOUS

## 2011-06-11 MED ORDER — EPHEDRINE SULFATE 50 MG/ML IJ SOLN
INTRAMUSCULAR | Status: DC | PRN
  Administered 2011-06-11: 10 mg via INTRAVENOUS

## 2011-06-11 MED ORDER — IBUPROFEN 200 MG PO TABS: 200.0000 mg | ORAL_TABLET | Freq: Four times a day (QID) | ORAL | Status: DC | PRN

## 2011-06-11 MED ORDER — FENTANYL CITRATE 0.05 MG/ML IJ SOLN
50.0000 ug | INTRAMUSCULAR | Status: DC | PRN
  Administered 2011-06-11: 100 ug via INTRAVENOUS

## 2011-06-11 MED ORDER — TECHNETIUM TC 99M SULFUR COLLOID FILTERED
1.0000 | Freq: Once | INTRAVENOUS | Status: AC | PRN
  Administered 2011-06-11: 1 via INTRADERMAL

## 2011-06-11 MED ORDER — CEFAZOLIN SODIUM-DEXTROSE 2-3 GM-% IV SOLR
2.0000 g | INTRAVENOUS | Status: AC
  Administered 2011-06-11: 2 g via INTRAVENOUS

## 2011-06-11 MED ORDER — DEXAMETHASONE SODIUM PHOSPHATE 4 MG/ML IJ SOLN
INTRAMUSCULAR | Status: DC | PRN
  Administered 2011-06-11: 10 mg via INTRAVENOUS

## 2011-06-11 MED ORDER — BUPIVACAINE-EPINEPHRINE PF 0.5-1:200000 % IJ SOLN
INTRAMUSCULAR | Status: DC | PRN
  Administered 2011-06-11: 10 mL

## 2011-06-11 MED ORDER — LIDOCAINE-PRILOCAINE 2.5-2.5 % EX CREA: 1.0000 "application " | TOPICAL_CREAM | Freq: Once | CUTANEOUS | Status: DC

## 2011-06-11 MED ORDER — LACTATED RINGERS IV SOLN
INTRAVENOUS | Status: DC
  Administered 2011-06-11 (×3): via INTRAVENOUS

## 2011-06-11 MED ORDER — HYDROCODONE-ACETAMINOPHEN 5-325 MG PO TABS: 1.0000 | ORAL_TABLET | ORAL | Status: AC | PRN

## 2011-06-11 MED ORDER — METOCLOPRAMIDE HCL 5 MG/ML IJ SOLN: 10.0000 mg | Freq: Once | INTRAMUSCULAR | Status: DC | PRN

## 2011-06-11 MED ORDER — MIDAZOLAM HCL 2 MG/2ML IJ SOLN: 1.0000 mg | INTRAMUSCULAR | Status: DC | PRN

## 2011-06-11 MED ORDER — OXYMETAZOLINE HCL 0.05 % NA SOLN: 2.0000 | Freq: Once | NASAL | Status: DC

## 2011-06-11 MED ORDER — LACTATED RINGERS IV SOLN
INTRAVENOUS | Status: DC
  Administered 2011-06-11: 16:00:00 via INTRAVENOUS

## 2011-06-11 MED ORDER — LACTATED RINGERS IV SOLN: 500.0000 mL | INTRAVENOUS | Status: DC

## 2011-06-11 SURGICAL SUPPLY — 49 items
ADH SKN CLS APL DERMABOND .7 (GAUZE/BANDAGES/DRESSINGS) ×2
APPLIER CLIP 9.375 MED OPEN (MISCELLANEOUS) ×2
APR CLP MED 9.3 20 MLT OPN (MISCELLANEOUS) ×1
BINDER BREAST MEDIUM (GAUZE/BANDAGES/DRESSINGS) ×1 IMPLANT
BINDER BREAST XLRG (GAUZE/BANDAGES/DRESSINGS) ×1 IMPLANT
BLADE SURG 10 STRL SS (BLADE) ×2 IMPLANT
BLADE SURG 15 STRL LF DISP TIS (BLADE) ×1 IMPLANT
BLADE SURG 15 STRL SS (BLADE) ×2
CANISTER SUCTION 1200CC (MISCELLANEOUS) ×2 IMPLANT
CHLORAPREP W/TINT 26ML (MISCELLANEOUS) ×2 IMPLANT
CLIP APPLIE 9.375 MED OPEN (MISCELLANEOUS) IMPLANT
CLIP TI WIDE RED SMALL 6 (CLIP) ×1 IMPLANT
CLOTH BEACON ORANGE TIMEOUT ST (SAFETY) ×2 IMPLANT
COVER MAYO STAND STRL (DRAPES) ×2 IMPLANT
COVER TABLE BACK 60X90 (DRAPES) ×2 IMPLANT
DECANTER SPIKE VIAL GLASS SM (MISCELLANEOUS) ×1 IMPLANT
DERMABOND ADVANCED (GAUZE/BANDAGES/DRESSINGS) ×2
DERMABOND ADVANCED .7 DNX12 (GAUZE/BANDAGES/DRESSINGS) ×1 IMPLANT
DEVICE DUBIN W/COMP PLATE 8390 (MISCELLANEOUS) ×1 IMPLANT
DRAPE LAPAROSCOPIC ABDOMINAL (DRAPES) ×2 IMPLANT
DRAPE UTILITY XL STRL (DRAPES) ×2 IMPLANT
ELECT COATED BLADE 2.86 ST (ELECTRODE) ×2 IMPLANT
ELECT REM PT RETURN 9FT ADLT (ELECTROSURGICAL) ×2
ELECTRODE REM PT RTRN 9FT ADLT (ELECTROSURGICAL) ×1 IMPLANT
GAUZE SPONGE 4X4 12PLY STRL LF (GAUZE/BANDAGES/DRESSINGS) ×1 IMPLANT
GLOVE BIO SURGEON STRL SZ7.5 (GLOVE) ×3 IMPLANT
GOWN PREVENTION PLUS XLARGE (GOWN DISPOSABLE) ×2 IMPLANT
KIT MARKER MARGIN INK (KITS) ×1 IMPLANT
NDL HYPO 25X1 1.5 SAFETY (NEEDLE) ×1 IMPLANT
NEEDLE HYPO 25X1 1.5 SAFETY (NEEDLE) ×2 IMPLANT
NS IRRIG 1000ML POUR BTL (IV SOLUTION) ×2 IMPLANT
PACK BASIN DAY SURGERY FS (CUSTOM PROCEDURE TRAY) ×2 IMPLANT
PENCIL BUTTON HOLSTER BLD 10FT (ELECTRODE) ×2 IMPLANT
SLEEVE SCD COMPRESS KNEE MED (MISCELLANEOUS) ×2 IMPLANT
SPONGE LAP 18X18 X RAY DECT (DISPOSABLE) ×2 IMPLANT
STAPLER VISISTAT 35W (STAPLE) IMPLANT
SUT MON AB 4-0 PC3 18 (SUTURE) ×3 IMPLANT
SUT SILK 2 0 SH (SUTURE) ×2 IMPLANT
SUT VIC AB 3-0 54X BRD REEL (SUTURE) IMPLANT
SUT VIC AB 3-0 BRD 54 (SUTURE)
SUT VIC AB 3-0 SH 27 (SUTURE) ×4
SUT VIC AB 3-0 SH 27X BRD (SUTURE) ×1 IMPLANT
SUT VICRYL 3-0 CR8 SH (SUTURE) ×1 IMPLANT
SYR CONTROL 10ML LL (SYRINGE) ×2 IMPLANT
TOWEL OR 17X24 6PK STRL BLUE (TOWEL DISPOSABLE) ×2 IMPLANT
TOWEL OR NON WOVEN STRL DISP B (DISPOSABLE) ×2 IMPLANT
TUBE CONNECTING 20X1/4 (TUBING) ×2 IMPLANT
WATER STERILE IRR 1000ML POUR (IV SOLUTION) ×2 IMPLANT
YANKAUER SUCT BULB TIP NO VENT (SUCTIONS) ×3 IMPLANT

## 2011-06-11 NOTE — Anesthesia Preprocedure Evaluation (Addendum)
Anesthesia Evaluation  Patient identified by MRN, date of birth, ID band Patient awake    Reviewed: Allergy & Precautions, H&P , NPO status , Patient's Chart, lab work & pertinent test results, reviewed documented beta blocker date and time   Airway Mallampati: II TM Distance: >3 FB Neck ROM: full    Dental No notable dental hx.    Pulmonary neg pulmonary ROS,    Pulmonary exam normal       Cardiovascular hypertension, On Medications neg cardio ROS     Neuro/Psych Negative Neurological ROS  Negative Psych ROS   GI/Hepatic negative GI ROS, Neg liver ROS, GERD-  Medicated,  Endo/Other  Negative Endocrine ROS  Renal/GU negative Renal ROS  Genitourinary negative   Musculoskeletal   Abdominal   Peds  Hematology negative hematology ROS (+)   Anesthesia Other Findings See surgeon's H&P   Reproductive/Obstetrics negative OB ROS                          Anesthesia Physical Anesthesia Plan  ASA: II  Anesthesia Plan: General   Post-op Pain Management:    Induction: Intravenous  Airway Management Planned: LMA  Additional Equipment:   Intra-op Plan:   Post-operative Plan: Extubation in OR  Informed Consent: I have reviewed the patients History and Physical, chart, labs and discussed the procedure including the risks, benefits and alternatives for the proposed anesthesia with the patient or authorized representative who has indicated his/her understanding and acceptance.   Dental Advisory Given  Plan Discussed with: CRNA and Surgeon  Anesthesia Plan Comments:       Anesthesia Quick Evaluation

## 2011-06-11 NOTE — Anesthesia Procedure Notes (Addendum)
Procedure Name: LMA Insertion Date/Time: 06/11/2011 12:31 PM Performed by: Meyer Russel Pre-anesthesia Checklist: Patient identified, Emergency Drugs available, Suction available, Patient being monitored and Timeout performed Patient Re-evaluated:Patient Re-evaluated prior to inductionOxygen Delivery Method: Circle System Utilized Preoxygenation: Pre-oxygenation with 100% oxygen Intubation Type: IV induction Ventilation: Mask ventilation without difficulty LMA: LMA inserted LMA Size: 4.0 Number of attempts: 1 Airway Equipment and Method: bite block Placement Confirmation: positive ETCO2 and breath sounds checked- equal and bilateral Tube secured with: Tape Dental Injury: Teeth and Oropharynx as per pre-operative assessment

## 2011-06-11 NOTE — Anesthesia Postprocedure Evaluation (Signed)
  Anesthesia Post-op Note  Patient: Dawn Bradley  Procedure(s) Performed:  LUMPECTOMY - Left Needle localization lumpectomy  Patient Location: PACU  Anesthesia Type: General  Level of Consciousness: awake, alert  and oriented  Airway and Oxygen Therapy: Patient Spontanous Breathing  Post-op Pain: moderate  Post-op Assessment: Post-op Vital signs reviewed, Patient's Cardiovascular Status Stable, Respiratory Function Stable and Patent Airway  Post-op Vital Signs: stable  Complications: No apparent anesthesia complications

## 2011-06-11 NOTE — Op Note (Signed)
06/11/2011  2:32 PM  PATIENT:  Dawn Bradley  70 y.o. female  PRE-OPERATIVE DIAGNOSIS:  Left breast cancer  POST-OPERATIVE DIAGNOSIS:  Left breast cancer  PROCEDURE:  Procedure(s): LUMPECTOMY  SURGEON:  Surgeon(s): Caleen Essex III, MD  PHYSICIAN ASSISTANT:   ASSISTANTS: none   ANESTHESIA:   general  EBL:  Total I/O In: 2000 [I.V.:2000] Out: -   BLOOD ADMINISTERED:none  DRAINS: none   LOCAL MEDICATIONS USED:  MARCAINE 10CC  SPECIMEN:  Excision  DISPOSITION OF SPECIMEN:  PATHOLOGY  COUNTS:  YES  TOURNIQUET:  * No tourniquets in log *  DICTATION: .Dragon Dictation  PLAN OF CARE: Discharge to home after PACU  PATIENT DISPOSITION:  PACU - hemodynamically stable.   Informed consent was obtained the patient was brought to the operating room and placed in the supine position on the operating room table. After adequate induction  of general anesthesia the patient'sleft breast chest and axillary area were prepped with ChloraPrep, allowed to dry, and draped in usual sterile manner. Earlier in the day the patient had undergone a wire localization procedure and the wire was entering the breast in the lower inner quadrant and headed laterally. Earlier in the day the patient also underwent injection of 1 mCi of technetium sulfur colloid in the subareolar position. At this 0.2 cc of methyl and blue and 3 cc of injectable saline were also injected in the subareolar position. The breast was massaged for several minutes. A neoprobe device was used to identify a hot spot in the left axilla. A small transverse incision was made with a 15 blade knife overlying the hot spot. Using the neoprobe to direct the dissection the incision was opened with the electrocautery. A [place] retractor was deployed. 2 hot lymph nodes were identified in the left axilla. Neither of which was blue. They were excised sharply with the electrocautery. Ex vivo counts were 3700. The second was around 500. These  were sent as sentinel node #1 and 2. The deep layer the wound was then closed with interrupted 3-0 Vicryl stitches. The wound was infiltrated with quarter percent Marcaine. The skin was then closed with a running 4-0 Monocryl subcuticular stitch. Attention was then turned to the left breast. A radial type incision was made in the lower inner quadrant with a 15 blade knife. The incision was carried through the skin and subcutaneous tissue sharply with the electrocautery. Once into the breast tissues the path of the wire could be palpated. A circular portion of breast tissue was excised sharply around the path of the wire with the electrocautery. Once this was accomplished and the specimen was removed the specimen was oriented to according to the assigned pink color something kit. A specimen radiograph showed the clip and calcifications to be within the specimen. It looked as though we may be a little bit close on the deep side. The dissection was already close to the chest wall muscle. At this point the rest of the breast tissue and pectoralis fascia over this location was removed sharply with electrocautery. It was marked with a stitch on the true surgical margin. The specimen was sent as the new deep margin. Hemostasis was achieved using the Bovie electrocautery. The wound was irrigated with copious amounts of saline. The deep layer the wound was closed with multiple layers of interrupted 3-0 Vicryl stitches the skin and subcutaneous tissue were dissected away from the breast to avoid any skin puckering. The skin was then closed with a running 4-0 Monocryl  subcuticular stitch. Dermabond dressings were applied. The patient tolerated the procedure well. She was awakened and taken to recovery in stable condition. At the end of the case all needle sponge and instrument counts were correct.

## 2011-06-11 NOTE — H&P (View-Only) (Signed)
Subjective:     Patient ID: Dawn Bradley, female   DOB: 02/16/1941, 70 y.o.   MRN: [id number]  HPI We're asked to see the patient in consultation by Dr. Margaret Bertran to evaluate her for a left breast cancer. The patient is a 70-year-old white female who recently went for a screening mammogram. At that time she was found to have some calcifications centrally in the left breast. 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 She was ER PR positive. She denied any breast pain. She denied any discharge from her nipple. She did note some intermittent tingling of the left breast for the last few years. she's otherwise been in good health.  Review of Systems  Constitutional: Negative.   HENT: Negative.   Eyes: Negative.   Respiratory: Negative.   Cardiovascular: Negative.   Gastrointestinal: Negative.   Genitourinary: Negative.   Musculoskeletal: Negative.   Skin: Negative.   Neurological: Negative.   Hematological: Negative.   Psychiatric/Behavioral: Negative.        Objective:   Physical Exam  Constitutional: She is oriented to person, place, and time. She appears well-developed and well-nourished.  HENT:  Head: Normocephalic and atraumatic.  Eyes: Conjunctivae and EOM are normal. Pupils are equal, round, and reactive to light.  Neck: Normal range of motion. Neck supple.  Cardiovascular: Normal rate, regular rhythm and normal heart sounds.   Pulmonary/Chest: Effort normal and breath sounds normal.       No palpable mass in either breast. No axillary supraclavicular cervical lymphadenopathy.  Abdominal: Soft. Bowel sounds are normal. She exhibits no mass. There is no tenderness.  Musculoskeletal: Normal range of motion.  Lymphadenopathy:    She has no cervical adenopathy.  Neurological: She is alert and oriented to person, place, and time.  Skin: Skin is warm and dry.  Psychiatric: She has a normal mood and affect. Her behavior is normal.       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 I've discussed the options for surgery with her including the risks and benefits as well as some of the technical aspects and she understands and wishes to proceed. She is very interested in breast conservation. She is also a good candidate for sentinel node mapping. She understands that if her margins are not clean that she may require a mastectomy.    Plan:     Plan for left breast needle localized lumpectomy. At this point she is unsure as to whether she will agree to the sentinel node mapping.      

## 2011-06-11 NOTE — Interval H&P Note (Signed)
History and Physical Interval Note:   06/11/2011   11:58 AM   Dawn Bradley  has presented today for surgery, with the diagnosis of Left breast cancer  The various methods of treatment have been discussed with the patient and family. After consideration of risks, benefits and other options for treatment, the patient has consented to  Procedure(s): LUMPECTOMY as a surgical intervention .  The patients' history has been reviewed, patient examined, no change in status, stable for surgery.  I have reviewed the patients' chart and labs.  Questions were answered to the patient's satisfaction.     Robyne Askew  MD

## 2011-06-11 NOTE — Transfer of Care (Signed)
Immediate Anesthesia Transfer of Care Note  Patient: Dawn Bradley  Procedure(s) Performed:  LUMPECTOMY - Left Needle localization lumpectomy  Patient Location: PACU  Anesthesia Type: General  Level of Consciousness: awake, alert , oriented and patient cooperative  Airway & Oxygen Therapy: Patient Spontanous Breathing and Patient connected to face mask oxygen  Post-op Assessment: Report given to PACU RN, Post -op Vital signs reviewed and stable and Patient moving all extremities  Post vital signs: Reviewed and stable  Complications: No apparent anesthesia complications

## 2011-06-16 ENCOUNTER — Encounter: Payer: Self-pay | Admitting: Radiation Oncology

## 2011-06-18 ENCOUNTER — Encounter (HOSPITAL_BASED_OUTPATIENT_CLINIC_OR_DEPARTMENT_OTHER): Payer: Self-pay | Admitting: General Surgery

## 2011-06-18 ENCOUNTER — Encounter (INDEPENDENT_AMBULATORY_CARE_PROVIDER_SITE_OTHER): Payer: Self-pay | Admitting: General Surgery

## 2011-06-20 ENCOUNTER — Telehealth: Payer: Self-pay | Admitting: Oncology

## 2011-06-20 ENCOUNTER — Other Ambulatory Visit (INDEPENDENT_AMBULATORY_CARE_PROVIDER_SITE_OTHER): Payer: Self-pay | Admitting: General Surgery

## 2011-06-20 NOTE — Telephone Encounter (Signed)
pt called and r/s aptps on 12/05 to jan2013

## 2011-06-24 ENCOUNTER — Ambulatory Visit: Payer: Medicare Other | Admitting: Radiation Oncology

## 2011-06-24 ENCOUNTER — Ambulatory Visit: Payer: Medicare Other

## 2011-06-24 ENCOUNTER — Encounter (HOSPITAL_BASED_OUTPATIENT_CLINIC_OR_DEPARTMENT_OTHER): Payer: Self-pay | Admitting: *Deleted

## 2011-06-24 NOTE — Progress Notes (Signed)
Having a re-excision breast-here11/12 Come in for bmet

## 2011-06-25 ENCOUNTER — Other Ambulatory Visit: Payer: Medicare Other | Admitting: Lab

## 2011-06-25 ENCOUNTER — Encounter (HOSPITAL_BASED_OUTPATIENT_CLINIC_OR_DEPARTMENT_OTHER)
Admission: RE | Admit: 2011-06-25 | Discharge: 2011-06-25 | Disposition: A | Discharge status: Home / Self Care | Payer: Medicare Other | Source: Ambulatory Visit | Attending: General Surgery | Admitting: General Surgery

## 2011-06-25 ENCOUNTER — Ambulatory Visit: Payer: Medicare Other | Admitting: Oncology

## 2011-06-25 LAB — BASIC METABOLIC PANEL
BUN: 23 mg/dL (ref 6–23)
CO2: 23 mEq/L (ref 19–32)
Chloride: 102 mEq/L (ref 96–112)
Creatinine, Ser: 0.7 mg/dL (ref 0.50–1.10)
Glucose, Bld: 140 mg/dL — ABNORMAL HIGH (ref 70–99)

## 2011-06-26 ENCOUNTER — Ambulatory Visit: Payer: Medicare Other | Admitting: Oncology

## 2011-06-27 ENCOUNTER — Encounter (INDEPENDENT_AMBULATORY_CARE_PROVIDER_SITE_OTHER): Payer: Medicare Other | Admitting: General Surgery

## 2011-06-27 ENCOUNTER — Other Ambulatory Visit (INDEPENDENT_AMBULATORY_CARE_PROVIDER_SITE_OTHER): Payer: Self-pay | Admitting: General Surgery

## 2011-06-27 ENCOUNTER — Encounter (HOSPITAL_BASED_OUTPATIENT_CLINIC_OR_DEPARTMENT_OTHER): Payer: Self-pay | Admitting: Anesthesiology

## 2011-06-27 ENCOUNTER — Encounter (HOSPITAL_BASED_OUTPATIENT_CLINIC_OR_DEPARTMENT_OTHER): Payer: Self-pay | Admitting: *Deleted

## 2011-06-27 ENCOUNTER — Ambulatory Visit (HOSPITAL_BASED_OUTPATIENT_CLINIC_OR_DEPARTMENT_OTHER): Payer: Medicare Other | Admitting: Anesthesiology

## 2011-06-27 ENCOUNTER — Encounter (HOSPITAL_BASED_OUTPATIENT_CLINIC_OR_DEPARTMENT_OTHER)
Admission: RE | Disposition: A | Discharge status: Home / Self Care | Payer: Self-pay | Source: Ambulatory Visit | Attending: General Surgery

## 2011-06-27 ENCOUNTER — Ambulatory Visit (HOSPITAL_BASED_OUTPATIENT_CLINIC_OR_DEPARTMENT_OTHER)
Admission: RE | Admit: 2011-06-27 | Discharge: 2011-06-27 | Disposition: A | Discharge status: Home / Self Care | Payer: Medicare Other | Source: Ambulatory Visit | Attending: General Surgery | Admitting: General Surgery

## 2011-06-27 DIAGNOSIS — C50919 Malignant neoplasm of unspecified site of unspecified female breast: Secondary | ICD-10-CM

## 2011-06-27 DIAGNOSIS — D0590 Unspecified type of carcinoma in situ of unspecified breast: Secondary | ICD-10-CM | POA: Insufficient documentation

## 2011-06-27 DIAGNOSIS — Z01812 Encounter for preprocedural laboratory examination: Secondary | ICD-10-CM | POA: Insufficient documentation

## 2011-06-27 SURGERY — EXCISION, LESION, BREAST
Anesthesia: General | Site: Breast | Laterality: Left | Wound class: Clean

## 2011-06-27 MED ORDER — ONDANSETRON HCL 4 MG/2ML IJ SOLN
INTRAMUSCULAR | Status: DC | PRN
  Administered 2011-06-27: 4 mg via INTRAVENOUS

## 2011-06-27 MED ORDER — DEXAMETHASONE SODIUM PHOSPHATE 4 MG/ML IJ SOLN
INTRAMUSCULAR | Status: DC | PRN
  Administered 2011-06-27: 10 mg via INTRAVENOUS

## 2011-06-27 MED ORDER — EPHEDRINE SULFATE 50 MG/ML IJ SOLN
INTRAMUSCULAR | Status: DC | PRN
  Administered 2011-06-27: 15 mg via INTRAVENOUS

## 2011-06-27 MED ORDER — LACTATED RINGERS IV SOLN
INTRAVENOUS | Status: DC
  Administered 2011-06-27: 09:00:00 via INTRAVENOUS

## 2011-06-27 MED ORDER — OXYCODONE-ACETAMINOPHEN 7.5-325 MG PO TABS: 1.0000 | ORAL_TABLET | ORAL | Status: AC | PRN

## 2011-06-27 MED ORDER — BUPIVACAINE-EPINEPHRINE 0.25% -1:200000 IJ SOLN
INTRAMUSCULAR | Status: DC | PRN
  Administered 2011-06-27: 8 mL

## 2011-06-27 MED ORDER — CEFAZOLIN SODIUM-DEXTROSE 2-3 GM-% IV SOLR
2.0000 g | INTRAVENOUS | Status: AC
  Administered 2011-06-27: 2 g via INTRAVENOUS

## 2011-06-27 MED ORDER — MIDAZOLAM HCL 5 MG/5ML IJ SOLN
INTRAMUSCULAR | Status: DC | PRN
  Administered 2011-06-27: 1 mg via INTRAVENOUS

## 2011-06-27 MED ORDER — FENTANYL CITRATE 0.05 MG/ML IJ SOLN
INTRAMUSCULAR | Status: DC | PRN
  Administered 2011-06-27: 50 ug via INTRAVENOUS

## 2011-06-27 MED ORDER — PROPOFOL 10 MG/ML IV EMUL
INTRAVENOUS | Status: DC | PRN
  Administered 2011-06-27: 150 mg via INTRAVENOUS

## 2011-06-27 MED ORDER — LIDOCAINE HCL (CARDIAC) 20 MG/ML IV SOLN
INTRAVENOUS | Status: DC | PRN
  Administered 2011-06-27: 50 mg via INTRAVENOUS

## 2011-06-27 SURGICAL SUPPLY — 43 items
ADH SKN CLS APL DERMABOND .7 (GAUZE/BANDAGES/DRESSINGS) ×1
BLADE SURG 10 STRL SS (BLADE) ×2 IMPLANT
BLADE SURG 15 STRL LF DISP TIS (BLADE) ×1 IMPLANT
BLADE SURG 15 STRL SS (BLADE) ×2
CANISTER SUCTION 1200CC (MISCELLANEOUS) ×2 IMPLANT
CHLORAPREP W/TINT 26ML (MISCELLANEOUS) ×2 IMPLANT
CLIP TI WIDE RED SMALL 6 (CLIP) IMPLANT
CLOTH BEACON ORANGE TIMEOUT ST (SAFETY) ×2 IMPLANT
COVER MAYO STAND STRL (DRAPES) ×2 IMPLANT
COVER TABLE BACK 60X90 (DRAPES) ×2 IMPLANT
DECANTER SPIKE VIAL GLASS SM (MISCELLANEOUS) ×1 IMPLANT
DERMABOND ADVANCED (GAUZE/BANDAGES/DRESSINGS) ×1
DERMABOND ADVANCED .7 DNX12 (GAUZE/BANDAGES/DRESSINGS) ×1 IMPLANT
DEVICE DUBIN W/COMP PLATE 8390 (MISCELLANEOUS) IMPLANT
DRAPE LAPAROSCOPIC ABDOMINAL (DRAPES) ×2 IMPLANT
DRAPE UTILITY XL STRL (DRAPES) ×2 IMPLANT
ELECT COATED BLADE 2.86 ST (ELECTRODE) ×2 IMPLANT
ELECT REM PT RETURN 9FT ADLT (ELECTROSURGICAL) ×2
ELECTRODE REM PT RTRN 9FT ADLT (ELECTROSURGICAL) ×1 IMPLANT
GLOVE BIO SURGEON STRL SZ 6 (GLOVE) ×1 IMPLANT
GLOVE BIO SURGEON STRL SZ7.5 (GLOVE) ×2 IMPLANT
GLOVE INDICATOR 6.5 STRL GRN (GLOVE) ×1 IMPLANT
GOWN PREVENTION PLUS XLARGE (GOWN DISPOSABLE) ×2 IMPLANT
NDL HYPO 25X1 1.5 SAFETY (NEEDLE) ×1 IMPLANT
NEEDLE HYPO 25X1 1.5 SAFETY (NEEDLE) ×2 IMPLANT
NS IRRIG 1000ML POUR BTL (IV SOLUTION) ×2 IMPLANT
PACK BASIN DAY SURGERY FS (CUSTOM PROCEDURE TRAY) ×2 IMPLANT
PENCIL BUTTON HOLSTER BLD 10FT (ELECTRODE) ×2 IMPLANT
SLEEVE SCD COMPRESS KNEE MED (MISCELLANEOUS) ×2 IMPLANT
SPONGE LAP 18X18 X RAY DECT (DISPOSABLE) ×2 IMPLANT
STAPLER VISISTAT 35W (STAPLE) IMPLANT
SUT MON AB 4-0 PC3 18 (SUTURE) ×2 IMPLANT
SUT SILK 2 0 SH (SUTURE) ×2 IMPLANT
SUT VIC AB 3-0 54X BRD REEL (SUTURE) IMPLANT
SUT VIC AB 3-0 BRD 54 (SUTURE)
SUT VIC AB 3-0 SH 27 (SUTURE) ×4
SUT VIC AB 3-0 SH 27X BRD (SUTURE) ×1 IMPLANT
SYR CONTROL 10ML LL (SYRINGE) ×2 IMPLANT
TOWEL OR 17X24 6PK STRL BLUE (TOWEL DISPOSABLE) ×2 IMPLANT
TOWEL OR NON WOVEN STRL DISP B (DISPOSABLE) ×2 IMPLANT
TUBE CONNECTING 20X1/4 (TUBING) ×2 IMPLANT
WATER STERILE IRR 1000ML POUR (IV SOLUTION) ×2 IMPLANT
YANKAUER SUCT BULB TIP NO VENT (SUCTIONS) ×2 IMPLANT

## 2011-06-27 NOTE — Interval H&P Note (Signed)
History and Physical Interval Note:  06/27/2011 8:59 AM  Dawn Bradley  has presented today for surgery, with the diagnosis of Left breast DCIS  The various methods of treatment have been discussed with the patient and family. After consideration of risks, benefits and other options for treatment, the patient has consented to  Procedure(Bradley): RE-EXCISION OF BREAST LUMPECTOMY as a surgical intervention .  The patients' history has been reviewed, patient examined, no change in status, stable for surgery.  I have reviewed the patients' chart and labs.  Questions were answered to the patient'Bradley satisfaction.     TOTH III,Dawn Bradley

## 2011-06-27 NOTE — Op Note (Signed)
06/27/2011  10:06 AM  PATIENT:  Dawn Bradley  70 y.o. female  PRE-OPERATIVE DIAGNOSIS:  Left breast DCIS  POST-OPERATIVE DIAGNOSIS:  Left breast DCIS  PROCEDURE:  Procedure(s): RE-EXCISION OF BREAST LUMPECTOMY  SURGEON:  Surgeon(s): Caleen Essex III, MD  PHYSICIAN ASSISTANT:   ASSISTANTS: none   ANESTHESIA:   general  EBL:  Total I/O In: 1000 [I.V.:1000] Out: -   BLOOD ADMINISTERED:none  DRAINS: none   LOCAL MEDICATIONS USED:  MARCAINE 10CC  SPECIMEN:  Excision  DISPOSITION OF SPECIMEN:  PATHOLOGY  COUNTS:  YES  TOURNIQUET:  * No tourniquets in log *  DICTATION: .Dragon Dictation. After informed consent was obtained the patient was brought to the operating room and placed in the supine position on the operating room table. After adequate induction of general anesthesia the patient's left breast was prepped with ChloraPrep, allowed to dry, and draped in usual sterile manner. Her old incision on her medial left breast was reopened with a 15 blade knife. The previous operative cavity was entered. A small lime of surrounding fluid was evacuated. The entire inferior edge of the cavity was excised sharply with the electrocautery all the way down to the pectoralis muscle. The specimen was oriented with a short stitch on the new true surgical margin and a long stitch on the deep portion of this.hemostasis was achieved using the Bovie electrocautery. The deep layer the wound was then closed with a couple layers of interrupted 3-0 Vicryl stitches. The wounds infiltrated with quarter percent Marcaine. The skin was then closed with her running 4-0 Monocryl subcuticular stitch. Dermabond dressing was applied. The patient tolerated the procedure well. At the end of the case all needle sponge and instrument counts are correct. The patient was then awakened and taken to recovery in stable condition.  PLAN OF CARE: Discharge to home after PACU  PATIENT DISPOSITION:  PACU -  hemodynamically stable.   Delay start of Pharmacological VTE agent (>24hrs) due to surgical blood loss or risk of bleeding:  {YES/NO/NOT APPLICABLE:20182

## 2011-06-27 NOTE — Anesthesia Procedure Notes (Signed)
Procedure Name: LMA Insertion Performed by: Kit Mollett Pre-anesthesia Checklist: Patient identified, Timeout performed, Emergency Drugs available, Suction available and Patient being monitored Patient Re-evaluated:Patient Re-evaluated prior to inductionOxygen Delivery Method: Circle System Utilized Preoxygenation: Pre-oxygenation with 100% oxygen Intubation Type: IV induction Ventilation: Mask ventilation without difficulty LMA: LMA with gastric port inserted LMA Size: 4.0 Number of attempts: 1 Placement Confirmation: breath sounds checked- equal and bilateral and positive ETCO2 Tube secured with: Tape Dental Injury: Teeth and Oropharynx as per pre-operative assessment      

## 2011-06-27 NOTE — Anesthesia Postprocedure Evaluation (Signed)
  Anesthesia Post-op Note  Patient: Dawn Bradley  Procedure(s) Performed:  RE-EXCISION OF BREAST LUMPECTOMY - Re-excision inferior margin of Left breast  Patient Location: PACU  Anesthesia Type: General  Level of Consciousness: awake  Airway and Oxygen Therapy: Patient Spontanous Breathing  Post-op Pain: mild  Post-op Assessment: Post-op Vital signs reviewed  Post-op Vital Signs: stable  Complications: No apparent anesthesia complications

## 2011-06-27 NOTE — Transfer of Care (Signed)
Immediate Anesthesia Transfer of Care Note  Patient: Dawn Bradley  Procedure(s) Performed:  RE-EXCISION OF BREAST LUMPECTOMY - Re-excision inferior margin of Left breast  Patient Location: PACU  Anesthesia Type: General  Level of Consciousness: awake, alert  and oriented  Airway & Oxygen Therapy: Patient Spontanous Breathing and Patient connected to face mask oxygen  Post-op Assessment: Report given to PACU RN and Post -op Vital signs reviewed and stable  Post vital signs: Reviewed and stable  Complications: No apparent anesthesia complications

## 2011-06-27 NOTE — H&P (View-Only) (Signed)
Subjective:     Patient ID: Dawn Bradley, female   DOB: 06/12/1941, 70 y.o.   MRN: [id number]  HPI We're asked to see the patient in consultation by Dr. Margaret Bertran to evaluate her for a left breast cancer. The patient is a 70-year-old white female who recently went for a screening mammogram. At that time she was found to have some calcifications centrally in the left breast. 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 She was ER PR positive. She denied any breast pain. She denied any discharge from her nipple. She did note some intermittent tingling of the left breast for the last few years. she's otherwise been in good health.  Review of Systems  Constitutional: Negative.   HENT: Negative.   Eyes: Negative.   Respiratory: Negative.   Cardiovascular: Negative.   Gastrointestinal: Negative.   Genitourinary: Negative.   Musculoskeletal: Negative.   Skin: Negative.   Neurological: Negative.   Hematological: Negative.   Psychiatric/Behavioral: Negative.        Objective:   Physical Exam  Constitutional: She is oriented to person, place, and time. She appears well-developed and well-nourished.  HENT:  Head: Normocephalic and atraumatic.  Eyes: Conjunctivae and EOM are normal. Pupils are equal, round, and reactive to light.  Neck: Normal range of motion. Neck supple.  Cardiovascular: Normal rate, regular rhythm and normal heart sounds.   Pulmonary/Chest: Effort normal and breath sounds normal.       No palpable mass in either breast. No axillary supraclavicular cervical lymphadenopathy.  Abdominal: Soft. Bowel sounds are normal. She exhibits no mass. There is no tenderness.  Musculoskeletal: Normal range of motion.  Lymphadenopathy:    She has no cervical adenopathy.  Neurological: She is alert and oriented to person, place, and time.  Skin: Skin is warm and dry.  Psychiatric: She has a normal mood and affect. Her behavior is normal.       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 I've discussed the options for surgery with her including the risks and benefits as well as some of the technical aspects and she understands and wishes to proceed. She is very interested in breast conservation. She is also a good candidate for sentinel node mapping. She understands that if her margins are not clean that she may require a mastectomy.    Plan:     Plan for left breast needle localized lumpectomy. At this point she is unsure as to whether she will agree to the sentinel node mapping.      

## 2011-06-27 NOTE — Anesthesia Preprocedure Evaluation (Signed)
Anesthesia Evaluation  Patient identified by MRN, date of birth, ID band  Reviewed: Allergy & Precautions, H&P , NPO status   Airway Mallampati: II      Dental   Pulmonary neg pulmonary ROS,  clear to auscultation        Cardiovascular hypertension, Regular Normal    Neuro/Psych    GI/Hepatic GERD-  ,  Endo/Other  Hypothyroidism   Renal/GU      Musculoskeletal   Abdominal   Peds  Hematology   Anesthesia Other Findings   Reproductive/Obstetrics                           Anesthesia Physical Anesthesia Plan  ASA: III  Anesthesia Plan: General   Post-op Pain Management:    Induction: Intravenous  Airway Management Planned: LMA  Additional Equipment:   Intra-op Plan:   Post-operative Plan:   Informed Consent: I have reviewed the patients History and Physical, chart, labs and discussed the procedure including the risks, benefits and alternatives for the proposed anesthesia with the patient or authorized representative who has indicated his/her understanding and acceptance.   Dental advisory given  Plan Discussed with:   Anesthesia Plan Comments:         Anesthesia Quick Evaluation

## 2011-07-01 NOTE — Progress Notes (Signed)
RETIRED REALTOR   ER....POSITIVE PR....POSITIVE

## 2011-07-02 ENCOUNTER — Encounter: Payer: Self-pay | Admitting: *Deleted

## 2011-07-02 ENCOUNTER — Ambulatory Visit
Admission: RE | Admit: 2011-07-02 | Discharge: 2011-07-02 | Disposition: A | Discharge status: Home / Self Care | Payer: Medicare Other | Source: Ambulatory Visit | Attending: Radiation Oncology | Admitting: Radiation Oncology

## 2011-07-02 ENCOUNTER — Encounter: Payer: Self-pay | Admitting: Radiation Oncology

## 2011-07-02 VITALS — BP 128/74 | HR 62 | Temp 97.5°F | Resp 18 | Ht 65.0 in | Wt 218.6 lb

## 2011-07-02 DIAGNOSIS — C50919 Malignant neoplasm of unspecified site of unspecified female breast: Secondary | ICD-10-CM

## 2011-07-02 NOTE — Progress Notes (Signed)
Encounter addended by: Amanda Pea, RN on: 07/02/2011  4:06 PM<BR>     Documentation filed: Charges VN

## 2011-07-02 NOTE — Progress Notes (Signed)
A followup note:  Diagnosis: DCIS of the left breast  The patient returns today for review and scheduling of her radiation therapy. I saw the patient in consultation one month ago at which time she presented with linear microcalcifications in the posterior mid breast. Breast MRI showed an area of enhancement measuring 3.9 x 1.3 x 1.0 cm. She underwent a left partial mastectomy and sentinel lymph node biopsy on 06/11/2011. She is found to have DCIS measuring 1.3 cm with tumor present along the inferior margin, focally. 2 lymph nodes were free of metastatic disease. She underwent reexcision on 06/27/2011 and there was no evidence for residual DCIS. She is doing well postoperatively.  Physical examination: Nodes: Without palpable cervical, supraclavicular, or axillary lymphadenopathy. Lungs: Lungs clear. Breasts: There is a partial mastectomy wound along the lower inner quadrant of the left breast at approximately 8:00. The wound is healing well. No masses are appreciated. Right breast without masses or lesions. Extremities without edema.  Impression DCIS of the left breast. We discussed local treatment options include mastectomy versus partial mastectomy followed by radiation therapy. I would like to obtain a baseline left breast mammogram to confirm removal of all microcalcifications prior to radiation treatment planning. We also discussed the B. 43 Herceptin protocol. His she will meet with a protocol nurse today. We discussed the potential acute and late toxicities of radiation therapy. We also discussed deep inspiration and breath-hold radiation therapy for her left-sided primary. Consent is signed today.  Plan: We will schedule a left breast mammogram and then get her scheduled for treatment planning. She will see Dr. Carolynne Edouard on December 31 and Dr. Darnelle Catalan on January 7. She'll have treatment planning the week of January 7.  30 minutes was spent face-to-face with the patient, primarily counseling the  patient.

## 2011-07-02 NOTE — Progress Notes (Signed)
07/02/11 at 1:31pm- Rn received referral from Dr. Dayton Scrape this am about a potential B-43 candidate.  Rn met with the pt and her husband for 40 minutes going over in detail the pre-entry consent and hipaa form.  Rn answered all of the pt's and husband's questions.  The pt said that she definitely wanted to have her tissue tested.  The pt then signed the pre-entry consent form and the hipaa form.  The pt was given a copy of her signed documents for her records.  The pt was given the B-43 treatment consent form and hipaa form to read if she is eligible for the B-43 study entry. Rn supplied the pt with the Rn's business card in case she has any questions about the study.  Rn then reviewed the protocol section 4.0. Rn performed the initial eligibility screening for the pt.  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  Rn then requested the representative paraffin block from the lumpectomy specimen from [facility].  Rn will await tumor block arrival.    07/03/11 at 2:01pm- Rn received the tumor block from [facility].  Rn then completed the Form [facility] for the B-43 study.  The tumor block will be shipped today.  Rn also faxed the pre-entry consent to [facility].  Rn will await the results of the HER2 tissue testing.    07/10/11 at 1:35pm-Rn received the results of the pt's tissue testing from [facility].  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  The pt is not eligible for the B-43 treatment study.  Dr. Dayton Scrape and Dr. Darnelle Catalan have been notified of the pt's Her2 status.  Rn left a message for the pt to call the research nurse to discuss her results.  07/10/11 at 4:27pm- Rn spoke to the pt about her status. The pt is aware that she is Her2 negative and that she is not eligible for the B-43 treatment study.  Rn thanked the pt for allowing [place] to test her tissue.

## 2011-07-02 NOTE — Progress Notes (Signed)
LEFT BREAST CANCER .

## 2011-07-10 ENCOUNTER — Institutional Professional Consult (permissible substitution): Payer: Medicare Other | Admitting: Radiation Oncology

## 2011-07-10 ENCOUNTER — Ambulatory Visit: Payer: Medicare Other

## 2011-07-10 ENCOUNTER — Encounter: Payer: Self-pay | Admitting: *Deleted

## 2011-07-21 ENCOUNTER — Encounter (INDEPENDENT_AMBULATORY_CARE_PROVIDER_SITE_OTHER): Payer: Self-pay | Admitting: General Surgery

## 2011-07-21 ENCOUNTER — Ambulatory Visit (INDEPENDENT_AMBULATORY_CARE_PROVIDER_SITE_OTHER): Payer: Medicare Other | Admitting: General Surgery

## 2011-07-21 VITALS — BP 132/82 | HR 68 | Temp 98.0°F | Resp 18 | Ht 65.0 in | Wt 216.2 lb

## 2011-07-21 DIAGNOSIS — C50919 Malignant neoplasm of unspecified site of unspecified female breast: Secondary | ICD-10-CM

## 2011-07-21 NOTE — Progress Notes (Signed)
Subjective:     Patient ID: Dawn Bradley, female   DOB: January 20, 1941, 70 y.o.   MRN: [id number]  HPI The patient is a 70 year old white female who is now 3 weeks out from a left breast lumpectomy for DCIS. Her margins are now clean and her nodes were negative. We did have to go back and re\re excise the inferior margin but this was cleaned on the reexcision. She has no complaints today. She was strongly ER/PR positive.  Review of Systems     Objective:   Physical Exam On exam her left breast and axillary incision have healed nicely. There is no sign of infection.    Assessment:     Left breast DCIS    Plan:     At this point she will continue to follow the medical and radiation oncologists. We will see her back in about one month to check her progress.

## 2011-07-25 ENCOUNTER — Encounter: Payer: Self-pay | Admitting: *Deleted

## 2011-07-25 NOTE — Progress Notes (Signed)
[facility] Psychosocial Distress Screening Clinical Social Work  Clinical Social Work was referred by distress screening protocol. The patient scored a 4 on the Psychosocial Distress Thermometer which indicates moderate distress.  Patient identified adjusting to her illness and anxious about the future as her main causes of distress. Clinical Social Worker attempted to contact patient to assess for distress and other psychosocial needs. Clinical Social Worker unable to reach patient, left voicemail requesting patient call whenever convenient. Kathrin Penner, MSW, [facility] Clinical Social Worker [facility] [phone]

## 2011-07-28 ENCOUNTER — Other Ambulatory Visit: Payer: Medicare Other

## 2011-07-28 ENCOUNTER — Ambulatory Visit (HOSPITAL_BASED_OUTPATIENT_CLINIC_OR_DEPARTMENT_OTHER): Payer: Medicare Other | Admitting: Oncology

## 2011-07-28 ENCOUNTER — Telehealth: Payer: Self-pay | Admitting: Oncology

## 2011-07-28 VITALS — BP 127/66 | HR 66 | Temp 98.2°F | Ht 65.0 in | Wt 215.5 lb

## 2011-07-28 DIAGNOSIS — C50919 Malignant neoplasm of unspecified site of unspecified female breast: Secondary | ICD-10-CM

## 2011-07-28 DIAGNOSIS — D0590 Unspecified type of carcinoma in situ of unspecified breast: Secondary | ICD-10-CM | POA: Diagnosis not present

## 2011-07-28 NOTE — Progress Notes (Signed)
Dawn Bradley is an 71 y.o. female.    Chief Complaint  Patient presents with  . Breast Cancer    HPI: Dawn Bradley is a 71 year old [place] woman who underwent routine screening mammography at [facility] 05/13/2011 new linear microcalcifications were noted in the posterior mid left breast and she was brought back for additional imaging October 25 this showed a 2 cm cluster of pleomorphic calcifications with associated mass or architectural distortion biopsy was performed October 30 and showed ([id number]) ductal carcinoma in situ, grade 3, strongly estrogen receptor positive at 99% also progesterone receptor positive at 72%.  With this information the patient was referred to Dr. Carolynne Edouard and bilateral breast MRIs were obtained at [facility] imaging 05/27/2011 this showed an area of non-masslike clumped linear enhancement centrally and posteriorly in the left breast measuring 3.9 cm there were no other enhancing lesions and no findings in the right breast of any concern there was no evidence of adenopathy. In November 2012 she underwent left lumpectomy and sentinel lymph node sampling, followed by a second surgery for margin clearance 06/27/2011. Results are noted below.  Interval history: Dawn Bradley did very well with her surgery, with unusual bleeding, pain, dehiscence, fever, or swelling. She has pain in the left shoulder but she knows she has a torn rotator cuff tear which she is trying to work around. She signed up for the B-43 study but did not qualify as she was HER-2 negative. Otherwise the interval history was unremarkable and she spent the holidays with family and friends here and at the beach  Past Medical History  Diagnosis Date  . GERD (gastroesophageal reflux disease)   . Hypertension   . Hyperlipidemia   . Hypothyroid   . Osteoarthritis   . Cancer     lt breast  . Diabetes mellitus     pre-diabetic    Past Surgical History  Procedure Date  . Appendectomy 1962  . Cholecystectomy 1971    . Anterior cruciate ligament repair 1960  . Tubal ligation 1980  . Replacement total knee bilateral 09/06/10  . Joint replacement     both knees 2/12  . Breast lumpectomy 06/11/2011    Procedure: LUMPECTOMY;  Surgeon: Robyne Askew, MD;  Location: [facility];  Service: General;  Laterality: Left;  Left Needle localization lumpectomy    Family History  Problem Relation Age of Onset  . 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 Her husband of 50 years Dawn Bradley is also retired. Their children are at daughter Dawn Bradley, 73, a homemaker in [place], Dawn Bradley 47  a business executive in [place] and Dawn Bradley 41 lives in [place] and is a homemaker. The patient attends [employer].  She  reports that she has never smoked. She does not have any smokeless tobacco history on file. She reports that she drinks alcohol approximately 1 glass of wine daily she has advanced directives in place  Health maintenance:  Cholesterol under treatment  Bone density normal 2011  Colonoscopy normal 2008  (PAP) 2010   Allergies:  Allergies  Allergen Reactions  . Shellfish Allergy Hives and Swelling  . Codeine     REACTION: heart races  . Tramadol Nausea And Vomiting    Medications Prior to Admission  Medication Sig Dispense Refill  . diazepam (VALIUM) 1 MG/ML solution Take 1 mg by mouth every 8 (eight) hours as needed.       . Levothyroxine Sodium (SYNTHROID PO) Take 88 mcg by mouth daily.       Marland Kitchen lisinopril (PRINIVIL,ZESTRIL) 10 MG tablet Take 10 mg by mouth  daily.       . simvastatin (ZOCOR) 10 MG tablet Take 20 mg by mouth at bedtime.       . Vitamin D, Ergocalciferol, (DRISDOL) 50000 UNITS CAPS Take 50,000 Units by mouth.        . zolpidem (AMBIEN) 5 MG tablet Take 5 mg by mouth at bedtime as needed.        No current facility-administered medications on file as of 07/28/2011.    ROS no unusual headaches visual changes cough phlegm production pleurisy shortness of breath nausea vomiting change in bowel or bladder habits, rash bleeding fever pain or other systemic symptoms. Her main concern at this point is her weight.  Physical Exam:  Blood pressure 127/66, pulse 66, temperature 98.2 F (36.8 C), height 5\' 5"  (1.651 m), weight 215 lb 8 oz (97.75 kg).  Sclerae unicteric Oropharynx clear No peripheral adenopathy Lungs no rales or rhonchi Heart regular rate and rhythm Abd benign MSK no focal spinal tenderness, no peripheral edema Neuro: nonfocal Breasts: Right breast no suspicious findings; left breast status post recent lumpectomy. The incisions are healing nicely, with no dehiscence, unusual induration, erythema, or swelling. CBC Lab Results  Component Value Date   WBC 6.3 05/28/2011   HGB 12.4 06/27/2011   HCT 38.9 05/28/2011   MCV 90.8 05/28/2011   PLT 228 05/28/2011   CMP     Component Value Date/Time   NA 137 06/25/2011 1400   K 4.9 06/25/2011 1400   CL 102 06/25/2011 1400   CO2 23 06/25/2011 1400   GLUCOSE 140* 06/25/2011 1400   BUN 23 06/25/2011 1400   CREATININE 0.70 06/25/2011 1400   CALCIUM 10.1 06/25/2011 1400   PROT 7.8 05/28/2011 1310   ALBUMIN 4.4 05/28/2011 1310   AST 26 05/28/2011 1310   ALT 27 05/28/2011 1310   ALKPHOS 80 05/28/2011 1310   BILITOT 0.4 05/28/2011 1310   GFRNONAA 85* 06/25/2011 1400   GFRAA >90 06/25/2011 1400     No results found.   Assessment: 71 year old [place] woman status post left lumpectomy and sentinel node biopsy November 2012 (with a second procedure for margin clearance) for a ductal  carcinoma in situ, grade 2, strongly estrogen and progesterone receptor positive, HER-2 negative  Plan:  She signed up for the B43 steady but was found to be HER-2 negative and so could not participate. She has healed very nicely from the surgery and is now ready to start radiation treatments under Dr. Dayton Scrape. I anticipate she will be finished by the end of February.  Today we discussed anti-estrogen therapy and she is well informed regarding the difference between tamoxifen and exemestane. After much discussion she decided tamoxifen was more to her liking and I went ahead and wrote her a prescription. We did discuss the issue of blood clots but she took hormone replacement for 2 years with no complications. We also discussed exercise and she is planning to do 15 minute walks 2-3 times a day  as tolerated. She plans to get back into water aerobics as soon as she gets clearance from Dr. Dayton Scrape.  She was started tamoxifen as soon as she is done with radiation. She will see me next in May. If she is tolerating it well the plan will be to do that for 5 years, with yearly visits. She will call however for any problems that may develop before that  :MAGRINAT,GUSTAV C 07/28/2011, 2:29 PM

## 2011-07-28 NOTE — Telephone Encounter (Signed)
Gv pt appt for may2013 

## 2011-07-29 ENCOUNTER — Other Ambulatory Visit: Payer: Self-pay | Admitting: *Deleted

## 2011-07-29 MED ORDER — TAMOXIFEN CITRATE 20 MG PO TABS: 20.0000 mg | ORAL_TABLET | Freq: Every day | ORAL | Status: DC

## 2011-07-30 DIAGNOSIS — Z853 Personal history of malignant neoplasm of breast: Secondary | ICD-10-CM | POA: Diagnosis not present

## 2011-08-04 ENCOUNTER — Ambulatory Visit
Admission: RE | Admit: 2011-08-04 | Discharge: 2011-08-04 | Disposition: A | Discharge status: Home / Self Care | Payer: Medicare Other | Source: Ambulatory Visit | Attending: Radiation Oncology | Admitting: Radiation Oncology

## 2011-08-04 DIAGNOSIS — Z510 Encounter for antineoplastic radiation therapy: Secondary | ICD-10-CM | POA: Diagnosis not present

## 2011-08-04 DIAGNOSIS — D0590 Unspecified type of carcinoma in situ of unspecified breast: Secondary | ICD-10-CM | POA: Diagnosis not present

## 2011-08-04 DIAGNOSIS — C50919 Malignant neoplasm of unspecified site of unspecified female breast: Secondary | ICD-10-CM

## 2011-08-04 DIAGNOSIS — C50319 Malignant neoplasm of lower-inner quadrant of unspecified female breast: Secondary | ICD-10-CM | POA: Diagnosis not present

## 2011-08-04 NOTE — Progress Notes (Signed)
Met with patient to discuss RO billing.  Rad Tx: 40981 Extrl Beam Dx: 174.3 Lower-inner quadrant of breast   Attending Rad: Dr. Dayton Scrape

## 2011-08-04 NOTE — Progress Notes (Signed)
Simulation/treatment planning note:  The patient underwent simulation/treatment planning today in the management of her DCIS of left breast. She was planned with "breath-hold". A custom neck mold was utilized with a custom breast board. Her field borders and left breast partial mastectomy scar were marked with radiopaque wires. She was scanned with a free breathing and also deep inspiration/breath-hold. There was considerable movement of the heart away from the chest wall with breath-hold. She is setup to tangential fields with field and field the planning. I prescribing 5000 cGy in 25 sessions utilizing 6 MV photons. 3-D simulation including an isodose plan is requested. This was followed by electron beam boost utilizing 18 MV photons for a further 1000 cGy in 5 sessions. Electron beam energy was chosen based on the depth of her tumor bed as seen on her CT scan.

## 2011-08-06 DIAGNOSIS — Z510 Encounter for antineoplastic radiation therapy: Secondary | ICD-10-CM | POA: Diagnosis not present

## 2011-08-06 DIAGNOSIS — D0590 Unspecified type of carcinoma in situ of unspecified breast: Secondary | ICD-10-CM | POA: Diagnosis not present

## 2011-08-06 DIAGNOSIS — C50319 Malignant neoplasm of lower-inner quadrant of unspecified female breast: Secondary | ICD-10-CM | POA: Diagnosis not present

## 2011-08-11 ENCOUNTER — Encounter: Payer: Self-pay | Admitting: Radiation Oncology

## 2011-08-11 ENCOUNTER — Ambulatory Visit
Admission: RE | Admit: 2011-08-11 | Discharge: 2011-08-11 | Disposition: A | Discharge status: Home / Self Care | Payer: Medicare Other | Source: Ambulatory Visit | Attending: Radiation Oncology | Admitting: Radiation Oncology

## 2011-08-11 DIAGNOSIS — C50319 Malignant neoplasm of lower-inner quadrant of unspecified female breast: Secondary | ICD-10-CM | POA: Diagnosis not present

## 2011-08-11 DIAGNOSIS — D0590 Unspecified type of carcinoma in situ of unspecified breast: Secondary | ICD-10-CM | POA: Diagnosis not present

## 2011-08-11 DIAGNOSIS — Z510 Encounter for antineoplastic radiation therapy: Secondary | ICD-10-CM | POA: Diagnosis not present

## 2011-08-11 NOTE — Progress Notes (Signed)
Simulation verification note: The patient underwent simulation verification today for treatment to her left breast.  Her isocenter is in good position and the multileaf collimators contoured the treatment volume appropriately. 

## 2011-08-12 ENCOUNTER — Ambulatory Visit
Admission: RE | Admit: 2011-08-12 | Discharge: 2011-08-12 | Disposition: A | Discharge status: Home / Self Care | Payer: Medicare Other | Source: Ambulatory Visit | Attending: Radiation Oncology | Admitting: Radiation Oncology

## 2011-08-12 DIAGNOSIS — D0590 Unspecified type of carcinoma in situ of unspecified breast: Secondary | ICD-10-CM | POA: Diagnosis not present

## 2011-08-12 DIAGNOSIS — C50319 Malignant neoplasm of lower-inner quadrant of unspecified female breast: Secondary | ICD-10-CM | POA: Diagnosis not present

## 2011-08-12 DIAGNOSIS — Z510 Encounter for antineoplastic radiation therapy: Secondary | ICD-10-CM | POA: Diagnosis not present

## 2011-08-13 ENCOUNTER — Ambulatory Visit
Admission: RE | Admit: 2011-08-13 | Discharge: 2011-08-13 | Disposition: A | Discharge status: Home / Self Care | Payer: Medicare Other | Source: Ambulatory Visit | Attending: Radiation Oncology | Admitting: Radiation Oncology

## 2011-08-13 DIAGNOSIS — D0590 Unspecified type of carcinoma in situ of unspecified breast: Secondary | ICD-10-CM | POA: Diagnosis not present

## 2011-08-13 DIAGNOSIS — Z510 Encounter for antineoplastic radiation therapy: Secondary | ICD-10-CM | POA: Diagnosis not present

## 2011-08-14 ENCOUNTER — Ambulatory Visit
Admission: RE | Admit: 2011-08-14 | Discharge: 2011-08-14 | Disposition: A | Discharge status: Home / Self Care | Payer: Medicare Other | Source: Ambulatory Visit | Attending: Radiation Oncology | Admitting: Radiation Oncology

## 2011-08-14 DIAGNOSIS — C50919 Malignant neoplasm of unspecified site of unspecified female breast: Secondary | ICD-10-CM

## 2011-08-14 DIAGNOSIS — D0590 Unspecified type of carcinoma in situ of unspecified breast: Secondary | ICD-10-CM | POA: Diagnosis not present

## 2011-08-14 DIAGNOSIS — Z510 Encounter for antineoplastic radiation therapy: Secondary | ICD-10-CM | POA: Diagnosis not present

## 2011-08-14 MED ORDER — RADIAPLEXRX EX GEL
Freq: Once | CUTANEOUS | Status: AC
  Administered 2011-08-14: 17:00:00 via TOPICAL

## 2011-08-14 MED ORDER — ALRA NON-METALLIC DEODORANT (RAD-ONC)
1.0000 "application " | Freq: Once | TOPICAL | Status: AC
  Administered 2011-08-14: 1 via TOPICAL

## 2011-08-14 NOTE — Progress Notes (Signed)
Patient presents to the clinic today accompanied by her husband, Rosanne Ashing, for post sim education. Patient is alert and oriented to person, place, and time. No distress noted. Steady gait noted. Pleasant affect noted. Patient denies pain. Oriented patient to routine of the clinic and staff. Provided patient with RADIATION THERAPY AND YOU handbook. Reviewed pertinent information within the handbook with the patient. Provided patient with Radiaplex and Alra then, reviewed use. Provided patient with this writer's business card and encouraged her to phone with needs. Patient and her husband verbalized understanding of all things reviewed. All questions answered.

## 2011-08-15 ENCOUNTER — Ambulatory Visit
Admission: RE | Admit: 2011-08-15 | Discharge: 2011-08-15 | Disposition: A | Discharge status: Home / Self Care | Payer: Medicare Other | Source: Ambulatory Visit | Attending: Radiation Oncology | Admitting: Radiation Oncology

## 2011-08-15 DIAGNOSIS — Z510 Encounter for antineoplastic radiation therapy: Secondary | ICD-10-CM | POA: Diagnosis not present

## 2011-08-15 DIAGNOSIS — D0590 Unspecified type of carcinoma in situ of unspecified breast: Secondary | ICD-10-CM | POA: Diagnosis not present

## 2011-08-18 ENCOUNTER — Encounter: Payer: Self-pay | Admitting: Radiation Oncology

## 2011-08-18 ENCOUNTER — Ambulatory Visit
Admission: RE | Admit: 2011-08-18 | Discharge: 2011-08-18 | Disposition: A | Discharge status: Home / Self Care | Payer: Medicare Other | Source: Ambulatory Visit | Attending: Radiation Oncology | Admitting: Radiation Oncology

## 2011-08-18 VITALS — BP 149/89 | HR 59 | Resp 18 | Wt 215.2 lb

## 2011-08-18 DIAGNOSIS — C50919 Malignant neoplasm of unspecified site of unspecified female breast: Secondary | ICD-10-CM

## 2011-08-18 DIAGNOSIS — D0590 Unspecified type of carcinoma in situ of unspecified breast: Secondary | ICD-10-CM | POA: Diagnosis not present

## 2011-08-18 DIAGNOSIS — Z510 Encounter for antineoplastic radiation therapy: Secondary | ICD-10-CM | POA: Diagnosis not present

## 2011-08-18 NOTE — Progress Notes (Addendum)
Patient presents to the clinic today accompanied by her husband for an under treat visit with Dr. Dayton Scrape. Patient is alert and oriented to person, place,and time. No distress noted. Steady gait noted. Pleasant affect noted. Patient denies pain at this time. Patient reports occasional sharp shooting pain in her left breast of which she refers to as a "zinger." Patient has no complaints. Reported all findings to Dr. Dayton Scrape.

## 2011-08-18 NOTE — Progress Notes (Signed)
Weekly Management Note:  Site:L Breast Current Dose:  1000  cGy Projected Dose: 6000  cGy  Narrative: The patient is seen today for routine under treatment assessment. CBCT/MVCT images/port films were reviewed. The chart was reviewed.   No complaints today. She does have Radioplex gel to use when necessary.  Physical Examination:  Filed Vitals:   08/18/11 1624  BP: 149/89  Pulse: 59  Resp: 18  .  Weight: 215 lb 3.2 oz (97.614 kg). No skin changes.  Impression: Tolerating radiation therapy well.  Plan: Continue radiation therapy as planned.

## 2011-08-19 ENCOUNTER — Ambulatory Visit: Admission: RE | Admit: 2011-08-19 | Payer: Medicare Other | Source: Ambulatory Visit

## 2011-08-19 ENCOUNTER — Ambulatory Visit
Admission: RE | Admit: 2011-08-19 | Discharge: 2011-08-19 | Disposition: A | Discharge status: Home / Self Care | Payer: Medicare Other | Source: Ambulatory Visit | Attending: Radiation Oncology | Admitting: Radiation Oncology

## 2011-08-19 DIAGNOSIS — C50319 Malignant neoplasm of lower-inner quadrant of unspecified female breast: Secondary | ICD-10-CM | POA: Diagnosis not present

## 2011-08-19 DIAGNOSIS — M1710 Unilateral primary osteoarthritis, unspecified knee: Secondary | ICD-10-CM | POA: Diagnosis not present

## 2011-08-19 DIAGNOSIS — IMO0002 Reserved for concepts with insufficient information to code with codable children: Secondary | ICD-10-CM | POA: Diagnosis not present

## 2011-08-19 DIAGNOSIS — D0590 Unspecified type of carcinoma in situ of unspecified breast: Secondary | ICD-10-CM | POA: Diagnosis not present

## 2011-08-19 DIAGNOSIS — Z510 Encounter for antineoplastic radiation therapy: Secondary | ICD-10-CM | POA: Diagnosis not present

## 2011-08-20 ENCOUNTER — Ambulatory Visit
Admission: RE | Admit: 2011-08-20 | Discharge: 2011-08-20 | Disposition: A | Discharge status: Home / Self Care | Payer: Medicare Other | Source: Ambulatory Visit | Attending: Radiation Oncology | Admitting: Radiation Oncology

## 2011-08-20 DIAGNOSIS — D0590 Unspecified type of carcinoma in situ of unspecified breast: Secondary | ICD-10-CM | POA: Diagnosis not present

## 2011-08-20 DIAGNOSIS — Z510 Encounter for antineoplastic radiation therapy: Secondary | ICD-10-CM | POA: Diagnosis not present

## 2011-08-21 ENCOUNTER — Ambulatory Visit
Admission: RE | Admit: 2011-08-21 | Discharge: 2011-08-21 | Disposition: A | Discharge status: Home / Self Care | Payer: Medicare Other | Source: Ambulatory Visit | Attending: Radiation Oncology | Admitting: Radiation Oncology

## 2011-08-21 ENCOUNTER — Ambulatory Visit (INDEPENDENT_AMBULATORY_CARE_PROVIDER_SITE_OTHER): Payer: Medicare Other | Admitting: General Surgery

## 2011-08-21 ENCOUNTER — Encounter (INDEPENDENT_AMBULATORY_CARE_PROVIDER_SITE_OTHER): Payer: Self-pay | Admitting: General Surgery

## 2011-08-21 ENCOUNTER — Telehealth: Payer: Self-pay | Admitting: *Deleted

## 2011-08-21 VITALS — BP 144/84 | HR 64 | Temp 98.4°F | Resp 12 | Ht 65.0 in | Wt 212.0 lb

## 2011-08-21 DIAGNOSIS — C50919 Malignant neoplasm of unspecified site of unspecified female breast: Secondary | ICD-10-CM

## 2011-08-21 DIAGNOSIS — D0590 Unspecified type of carcinoma in situ of unspecified breast: Secondary | ICD-10-CM | POA: Diagnosis not present

## 2011-08-21 DIAGNOSIS — Z510 Encounter for antineoplastic radiation therapy: Secondary | ICD-10-CM | POA: Diagnosis not present

## 2011-08-21 NOTE — Progress Notes (Signed)
Subjective:     Patient ID: Dawn Bradley, female   DOB: 29-Jan-1941, 71 y.o.   MRN: [id number]  HPI The patient is a 71 year old white female who is now about 2 months out from a left breast lumpectomy and negative sentinel node biopsy for DCIS. She has just started her radiation therapy. She has been noticing some sharp stinging pain started coming go. Otherwise she seems to be doing well.  Review of Systems     Objective:   Physical Exam On exam her left breast and axillary incisions are healing nicely. She has minimal redness of the left breast.    Assessment:     Status post left breast lumpectomy and negative sentinel node biopsy for DCIS    Plan:     At this point I have encouraged her to continue regular self exams. She is considering starting Femara. We will plan to see her back in 3 months

## 2011-08-21 NOTE — Telephone Encounter (Signed)
Message left by pt stating she is to start Tamoxifen upon completion of radiation therapy . " But I would prefer to be switched to femara ".  Per message pt states she can pick up prescription when she comes in for radiation.  Return call number [phone].

## 2011-08-21 NOTE — Patient Instructions (Signed)
Continue regular self exams  

## 2011-08-22 ENCOUNTER — Ambulatory Visit
Admission: RE | Admit: 2011-08-22 | Discharge: 2011-08-22 | Disposition: A | Discharge status: Home / Self Care | Payer: Medicare Other | Source: Ambulatory Visit | Attending: Radiation Oncology | Admitting: Radiation Oncology

## 2011-08-22 DIAGNOSIS — D0590 Unspecified type of carcinoma in situ of unspecified breast: Secondary | ICD-10-CM | POA: Diagnosis not present

## 2011-08-22 DIAGNOSIS — Z510 Encounter for antineoplastic radiation therapy: Secondary | ICD-10-CM | POA: Diagnosis not present

## 2011-08-25 ENCOUNTER — Encounter: Payer: Self-pay | Admitting: Radiation Oncology

## 2011-08-25 ENCOUNTER — Ambulatory Visit
Admission: RE | Admit: 2011-08-25 | Discharge: 2011-08-25 | Disposition: A | Discharge status: Home / Self Care | Payer: Medicare Other | Source: Ambulatory Visit | Attending: Radiation Oncology | Admitting: Radiation Oncology

## 2011-08-25 VITALS — BP 133/74 | HR 62 | Resp 18 | Wt 207.3 lb

## 2011-08-25 DIAGNOSIS — D0590 Unspecified type of carcinoma in situ of unspecified breast: Secondary | ICD-10-CM | POA: Diagnosis not present

## 2011-08-25 DIAGNOSIS — Z510 Encounter for antineoplastic radiation therapy: Secondary | ICD-10-CM | POA: Diagnosis not present

## 2011-08-25 DIAGNOSIS — C50919 Malignant neoplasm of unspecified site of unspecified female breast: Secondary | ICD-10-CM

## 2011-08-25 NOTE — Progress Notes (Signed)
Patient presented to the clinic today accompanied by her husband for under treat visit with Dr. Dayton Scrape. Patient is alert and oriented to person, place, and time. No distress noted. Steady gait noted. Pleasant affect noted. Patient denies pain at this time. Patient reports occasional brief sharp shooting pains at the incision site 10 on a scale of 0-10. Assured patient this patient would become less intense and frequent as her body healed. Patient verbalized understanding. Patient reports her nipple feels tender and dry. Provided patient with non adherent dressing and encouraged her to apply ample radiaplex to nipple then gauze to prevent friction from clothing. Patient has no other complaints. Reported all findings to Dr. Dayton Scrape.

## 2011-08-25 NOTE — Progress Notes (Signed)
Weekly Management Note:  Site:L Breast Current Dose:  2000  cGy Projected Dose: 6000  cGy  Narrative: The patient is seen today for routine under treatment assessment. CBCT/MVCT images/port films were reviewed. The chart was reviewed.   She does have occasional shooting pain within the left breast. She does have mild to moderate fatigue. She has Radioplex gel to use when necessary.  Physical Examination:  Filed Vitals:   08/25/11 1608  BP: 133/74  Pulse: 62  Resp: 18  .  Weight: 207 lb 4.8 oz (94.031 kg). There is mild erythema the skin with no areas of desquamation.  Impression: Tolerating radiation therapy well.  Plan: Continue radiation therapy as planned.

## 2011-08-26 ENCOUNTER — Ambulatory Visit
Admission: RE | Admit: 2011-08-26 | Discharge: 2011-08-26 | Disposition: A | Discharge status: Home / Self Care | Payer: Medicare Other | Source: Ambulatory Visit | Attending: Radiation Oncology | Admitting: Radiation Oncology

## 2011-08-26 ENCOUNTER — Encounter: Payer: Self-pay | Admitting: *Deleted

## 2011-08-26 DIAGNOSIS — C50319 Malignant neoplasm of lower-inner quadrant of unspecified female breast: Secondary | ICD-10-CM | POA: Diagnosis not present

## 2011-08-26 DIAGNOSIS — C50919 Malignant neoplasm of unspecified site of unspecified female breast: Secondary | ICD-10-CM | POA: Diagnosis not present

## 2011-08-26 NOTE — Progress Notes (Unsigned)
Per MD, left pt a message. "ok to start letrozole instead of tamoxifen"

## 2011-08-27 ENCOUNTER — Ambulatory Visit
Admission: RE | Admit: 2011-08-27 | Discharge: 2011-08-27 | Disposition: A | Discharge status: Home / Self Care | Payer: Medicare Other | Source: Ambulatory Visit | Attending: Radiation Oncology | Admitting: Radiation Oncology

## 2011-08-27 ENCOUNTER — Other Ambulatory Visit: Payer: Self-pay | Admitting: *Deleted

## 2011-08-27 DIAGNOSIS — C50919 Malignant neoplasm of unspecified site of unspecified female breast: Secondary | ICD-10-CM | POA: Diagnosis not present

## 2011-08-27 MED ORDER — LETROZOLE 2.5 MG PO TABS: 2.5000 mg | ORAL_TABLET | Freq: Every day | ORAL | Status: DC

## 2011-08-28 ENCOUNTER — Ambulatory Visit
Admission: RE | Admit: 2011-08-28 | Discharge: 2011-08-28 | Disposition: A | Discharge status: Home / Self Care | Payer: Medicare Other | Source: Ambulatory Visit | Attending: Radiation Oncology | Admitting: Radiation Oncology

## 2011-08-28 ENCOUNTER — Other Ambulatory Visit: Payer: Self-pay | Admitting: *Deleted

## 2011-08-28 DIAGNOSIS — C50919 Malignant neoplasm of unspecified site of unspecified female breast: Secondary | ICD-10-CM | POA: Diagnosis not present

## 2011-08-28 DIAGNOSIS — M25649 Stiffness of unspecified hand, not elsewhere classified: Secondary | ICD-10-CM | POA: Diagnosis not present

## 2011-08-28 MED ORDER — LETROZOLE 2.5 MG PO TABS: 2.5000 mg | ORAL_TABLET | Freq: Every day | ORAL | Status: AC

## 2011-08-28 NOTE — Telephone Encounter (Signed)
Per MD, a new RX for Dawn Bradley has been called to [facility]

## 2011-08-29 ENCOUNTER — Ambulatory Visit
Admission: RE | Admit: 2011-08-29 | Discharge: 2011-08-29 | Disposition: A | Discharge status: Home / Self Care | Payer: Medicare Other | Source: Ambulatory Visit | Attending: Radiation Oncology | Admitting: Radiation Oncology

## 2011-08-29 DIAGNOSIS — C50919 Malignant neoplasm of unspecified site of unspecified female breast: Secondary | ICD-10-CM | POA: Diagnosis not present

## 2011-09-01 ENCOUNTER — Ambulatory Visit
Admission: RE | Admit: 2011-09-01 | Discharge: 2011-09-01 | Disposition: A | Discharge status: Home / Self Care | Payer: Medicare Other | Source: Ambulatory Visit | Attending: Radiation Oncology | Admitting: Radiation Oncology

## 2011-09-01 ENCOUNTER — Encounter: Payer: Self-pay | Admitting: Radiation Oncology

## 2011-09-01 VITALS — BP 124/76 | HR 61 | Resp 18 | Wt 201.5 lb

## 2011-09-01 DIAGNOSIS — C50919 Malignant neoplasm of unspecified site of unspecified female breast: Secondary | ICD-10-CM | POA: Diagnosis not present

## 2011-09-01 MED ORDER — RADIAPLEXRX EX GEL
Freq: Once | CUTANEOUS | Status: AC
  Administered 2011-09-01: 16:00:00 via TOPICAL

## 2011-09-01 NOTE — Progress Notes (Signed)
Weekly Management Note:  Site:L Breast Current Dose:  3000  cGy Projected Dose: 6000  cGy  Narrative: The patient is seen today for routine under treatment assessment. CBCT/MVCT images/port films were reviewed. The chart was reviewed.   She is without new complaints today. She uses Radioplex gel when necessary.  Physical Examination:  Filed Vitals:   09/01/11 1603  BP: 124/76  Pulse: 61  Resp: 18  .  Weight: 201 lb 8 oz (91.4 kg). There is a faint papular area of erythema along the upper inner quadrant of the left breast. Otherwise, no significant skin changes.  Impression: Tolerating radiation therapy well.  Plan: Continue radiation therapy as planned.

## 2011-09-01 NOTE — Progress Notes (Signed)
Patient presents to the clinic today accompanied by her husband for an under treat visit with Dr. Dayton Scrape. Patient is alert and oriented to person, place, and time. No distress noted. Steady gait noted. Pleasant affect noted. Patient denies pain at this time. Patient reports that recently at night when she goes to bed she has a headache. Patient reports that occasionally she wakes up with a headache in the middle of the night. Patient reports that Advil relieves these headaches. Patient denies double vision, dizziness, or nausea. Patient reports using Radiaplex as directed. Faint hyperpigmentation of the left breast noted with mild radiation rash between the breast. Patient reports a marked increase in her appetite but no weight gain noted. Reported all findings to Dr. Dayton Scrape

## 2011-09-02 ENCOUNTER — Ambulatory Visit
Admission: RE | Admit: 2011-09-02 | Discharge: 2011-09-02 | Disposition: A | Discharge status: Home / Self Care | Payer: Medicare Other | Source: Ambulatory Visit | Attending: Radiation Oncology | Admitting: Radiation Oncology

## 2011-09-02 DIAGNOSIS — C50319 Malignant neoplasm of lower-inner quadrant of unspecified female breast: Secondary | ICD-10-CM | POA: Diagnosis not present

## 2011-09-02 DIAGNOSIS — C50919 Malignant neoplasm of unspecified site of unspecified female breast: Secondary | ICD-10-CM | POA: Diagnosis not present

## 2011-09-03 ENCOUNTER — Encounter: Payer: Self-pay | Admitting: Radiation Oncology

## 2011-09-03 ENCOUNTER — Ambulatory Visit
Admission: RE | Admit: 2011-09-03 | Discharge: 2011-09-03 | Disposition: A | Discharge status: Home / Self Care | Payer: Medicare Other | Source: Ambulatory Visit | Attending: Radiation Oncology | Admitting: Radiation Oncology

## 2011-09-03 DIAGNOSIS — C50319 Malignant neoplasm of lower-inner quadrant of unspecified female breast: Secondary | ICD-10-CM | POA: Diagnosis not present

## 2011-09-03 DIAGNOSIS — C50919 Malignant neoplasm of unspecified site of unspecified female breast: Secondary | ICD-10-CM | POA: Diagnosis not present

## 2011-09-03 NOTE — Progress Notes (Signed)
Electron beam simulation note:  The patient underwent virtual simulation for her left breast boost. The field was set up en face. One custom block was constructed to shield the normal surrounding structures. A special port plan was requested. I prescribing 1000 cGy in 5 sessions utilizing 18 MEV electrons. Electron beam energy was chosen based on the depth of her tumor bed as seen on her CT scan. I chose the 90% isodose curve for her prescription.

## 2011-09-04 ENCOUNTER — Ambulatory Visit
Admission: RE | Admit: 2011-09-04 | Discharge: 2011-09-04 | Disposition: A | Discharge status: Home / Self Care | Payer: Medicare Other | Source: Ambulatory Visit | Attending: Radiation Oncology | Admitting: Radiation Oncology

## 2011-09-04 DIAGNOSIS — C50919 Malignant neoplasm of unspecified site of unspecified female breast: Secondary | ICD-10-CM | POA: Diagnosis not present

## 2011-09-05 ENCOUNTER — Ambulatory Visit
Admission: RE | Admit: 2011-09-05 | Discharge: 2011-09-05 | Disposition: A | Discharge status: Home / Self Care | Payer: Medicare Other | Source: Ambulatory Visit | Attending: Radiation Oncology | Admitting: Radiation Oncology

## 2011-09-05 DIAGNOSIS — C50919 Malignant neoplasm of unspecified site of unspecified female breast: Secondary | ICD-10-CM | POA: Diagnosis not present

## 2011-09-08 ENCOUNTER — Encounter: Payer: Self-pay | Admitting: Radiation Oncology

## 2011-09-08 ENCOUNTER — Ambulatory Visit
Admission: RE | Admit: 2011-09-08 | Discharge: 2011-09-08 | Disposition: A | Discharge status: Home / Self Care | Payer: Medicare Other | Source: Ambulatory Visit | Attending: Radiation Oncology | Admitting: Radiation Oncology

## 2011-09-08 VITALS — Wt 222.1 lb

## 2011-09-08 DIAGNOSIS — C50919 Malignant neoplasm of unspecified site of unspecified female breast: Secondary | ICD-10-CM

## 2011-09-08 NOTE — Progress Notes (Signed)
Weekly Management Note:  Site:L Breast Current Dose:  4000  cGy Projected Dose: 6000  cGy  Narrative: The patient is seen today for routine under treatment assessment. CBCT/MVCT images/port films were reviewed. The chart was reviewed.   She recently noted a mass along her lateral left breast. She uses Radioplex gel.  Physical Examination: There were no vitals filed for this visit..  Weight: 222 lb 1.6 oz (100.744 kg). There is likely to be a 2-3 cm seroma along the lower axilla related to her sentinel lymph node biopsy. There is a smaller, 1 cm mass along her lateral breast which I believe is along her tumor bed. There is erythema the skin but no areas of desquamation.  Impression: Tolerating radiation therapy well. She is reassured about her masses.  Plan: Continue radiation therapy as planned.

## 2011-09-08 NOTE — Progress Notes (Signed)
Pt pt left breast erythema, pt noticed a lump under left arm Friday night, felt size of quarter, pt taking advil prn pain there , especially if she touches that area, pt has gained 20 lbs since 09/01/11, no edema in her feet, no c/o shortness breath, , has slight headaches, not drinking enough water stated, drinks 3 cups coffee daily, pt concerned about weight has, been eating out a lot lately mostly pastas and bread, skin intact,  4:08 PM

## 2011-09-09 ENCOUNTER — Ambulatory Visit
Admission: RE | Admit: 2011-09-09 | Discharge: 2011-09-09 | Disposition: A | Discharge status: Home / Self Care | Payer: Medicare Other | Source: Ambulatory Visit | Attending: Radiation Oncology | Admitting: Radiation Oncology

## 2011-09-09 DIAGNOSIS — C50319 Malignant neoplasm of lower-inner quadrant of unspecified female breast: Secondary | ICD-10-CM | POA: Diagnosis not present

## 2011-09-09 DIAGNOSIS — C50919 Malignant neoplasm of unspecified site of unspecified female breast: Secondary | ICD-10-CM | POA: Diagnosis not present

## 2011-09-10 ENCOUNTER — Ambulatory Visit
Admission: RE | Admit: 2011-09-10 | Discharge: 2011-09-10 | Disposition: A | Discharge status: Home / Self Care | Payer: Medicare Other | Source: Ambulatory Visit | Attending: Radiation Oncology | Admitting: Radiation Oncology

## 2011-09-10 DIAGNOSIS — C50919 Malignant neoplasm of unspecified site of unspecified female breast: Secondary | ICD-10-CM | POA: Diagnosis not present

## 2011-09-11 ENCOUNTER — Ambulatory Visit
Admission: RE | Admit: 2011-09-11 | Discharge: 2011-09-11 | Disposition: A | Discharge status: Home / Self Care | Payer: Medicare Other | Source: Ambulatory Visit | Attending: Radiation Oncology | Admitting: Radiation Oncology

## 2011-09-11 DIAGNOSIS — C50919 Malignant neoplasm of unspecified site of unspecified female breast: Secondary | ICD-10-CM | POA: Diagnosis not present

## 2011-09-12 ENCOUNTER — Ambulatory Visit
Admission: RE | Admit: 2011-09-12 | Discharge: 2011-09-12 | Disposition: A | Discharge status: Home / Self Care | Payer: Medicare Other | Source: Ambulatory Visit | Attending: Radiation Oncology | Admitting: Radiation Oncology

## 2011-09-12 DIAGNOSIS — C50919 Malignant neoplasm of unspecified site of unspecified female breast: Secondary | ICD-10-CM | POA: Diagnosis not present

## 2011-09-15 ENCOUNTER — Encounter: Payer: Self-pay | Admitting: Radiation Oncology

## 2011-09-15 ENCOUNTER — Ambulatory Visit
Admission: RE | Admit: 2011-09-15 | Discharge: 2011-09-15 | Disposition: A | Discharge status: Home / Self Care | Payer: Medicare Other | Source: Ambulatory Visit | Attending: Radiation Oncology | Admitting: Radiation Oncology

## 2011-09-15 VITALS — BP 140/76 | HR 57 | Resp 18 | Wt 215.9 lb

## 2011-09-15 DIAGNOSIS — C50919 Malignant neoplasm of unspecified site of unspecified female breast: Secondary | ICD-10-CM | POA: Diagnosis not present

## 2011-09-15 NOTE — Progress Notes (Signed)
Weekly Management Note:  Site:L Breast Current Dose:  5000  cGy Projected Dose: 6000  cGy  Narrative: The patient is seen today for routine under treatment assessment. CBCT/MVCT images/port films were reviewed. The chart was reviewed.   She does report mild fatigue. 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 She uses Radioplex gel when necessary.  Physical Examination:  Filed Vitals:   09/15/11 0932  BP: 140/76  Pulse: 57  Resp: 18  .  Weight: 215 lb 14.4 oz (97.932 kg). There is moderate to severe erythema along the left breast with a small patch of dry desquamation along the right axilla. No areas of moist desquamation.  Impression: Tolerating radiation therapy well. 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  Plan: Continue radiation therapy as planned.

## 2011-09-15 NOTE — Progress Notes (Signed)
Patient presents to the clinic today accompanied by her husband for an under treat visit with Dr. Dayton Scrape. Patient is alert and oriented to person, place, and time. No distress noted. Steady gait noted. Pleasant affect noted. Patient reports left axilla aching pain 3 on a scale of 0-10 "where that lump is." patient reports taking advil otc to relieve this pain. Patient reports occasional brief sharp shooting pain in her left breast as well. Patient reports fatigue. Patient reports using Radiaplex as directed. Reported all findings to Dr. Dayton Scrape.

## 2011-09-16 ENCOUNTER — Ambulatory Visit
Admission: RE | Admit: 2011-09-16 | Discharge: 2011-09-16 | Disposition: A | Discharge status: Home / Self Care | Payer: Medicare Other | Source: Ambulatory Visit | Attending: Radiation Oncology | Admitting: Radiation Oncology

## 2011-09-16 DIAGNOSIS — C50319 Malignant neoplasm of lower-inner quadrant of unspecified female breast: Secondary | ICD-10-CM | POA: Diagnosis not present

## 2011-09-16 DIAGNOSIS — C50919 Malignant neoplasm of unspecified site of unspecified female breast: Secondary | ICD-10-CM | POA: Diagnosis not present

## 2011-09-17 ENCOUNTER — Ambulatory Visit
Admission: RE | Admit: 2011-09-17 | Discharge: 2011-09-17 | Disposition: A | Discharge status: Home / Self Care | Payer: Medicare Other | Source: Ambulatory Visit | Attending: Radiation Oncology | Admitting: Radiation Oncology

## 2011-09-17 DIAGNOSIS — C50919 Malignant neoplasm of unspecified site of unspecified female breast: Secondary | ICD-10-CM | POA: Diagnosis not present

## 2011-09-18 ENCOUNTER — Ambulatory Visit
Admission: RE | Admit: 2011-09-18 | Discharge: 2011-09-18 | Disposition: A | Discharge status: Home / Self Care | Payer: Medicare Other | Source: Ambulatory Visit | Attending: Radiation Oncology | Admitting: Radiation Oncology

## 2011-09-18 DIAGNOSIS — C50919 Malignant neoplasm of unspecified site of unspecified female breast: Secondary | ICD-10-CM | POA: Diagnosis not present

## 2011-09-19 ENCOUNTER — Ambulatory Visit
Admission: RE | Admit: 2011-09-19 | Discharge: 2011-09-19 | Disposition: A | Discharge status: Home / Self Care | Payer: Medicare Other | Source: Ambulatory Visit | Attending: Radiation Oncology | Admitting: Radiation Oncology

## 2011-09-19 DIAGNOSIS — C50919 Malignant neoplasm of unspecified site of unspecified female breast: Secondary | ICD-10-CM | POA: Diagnosis not present

## 2011-09-19 DIAGNOSIS — H43819 Vitreous degeneration, unspecified eye: Secondary | ICD-10-CM | POA: Diagnosis not present

## 2011-09-22 ENCOUNTER — Ambulatory Visit
Admission: RE | Admit: 2011-09-22 | Discharge: 2011-09-22 | Disposition: A | Discharge status: Home / Self Care | Payer: Medicare Other | Source: Ambulatory Visit | Attending: Radiation Oncology | Admitting: Radiation Oncology

## 2011-09-22 VITALS — Wt 216.7 lb

## 2011-09-22 DIAGNOSIS — C50919 Malignant neoplasm of unspecified site of unspecified female breast: Secondary | ICD-10-CM

## 2011-09-22 MED ORDER — RADIAPLEXRX EX GEL
Freq: Once | CUTANEOUS | Status: AC
  Administered 2011-09-22: 1 via TOPICAL

## 2011-09-22 NOTE — Progress Notes (Signed)
Encounter addended by: Tessa Lerner, RN on: 09/22/2011  4:20 PM<BR>     Documentation filed: Inpatient MAR

## 2011-09-22 NOTE — Progress Notes (Signed)
C/O HARD TO SWALLOW, STARTS COUGHING LIKE ASPIRATING WHEN TRIES TO SWALLOW LIQUIDS ALSO TROUBLE WITH SWALLOWING SOLIDS.  SKIN IS BRIGHT RED IN COLOR WITH NO DESQUAMATION NOTED.  C/O ITCHING AT [facility] AND ALL OVER.  HAS TO TAKE NAP AFTER ANY ACTIVITY.  TROUBLE SLEEPING AT NIGHT , HAS A HARD TIME GOING TO SLEEP.   FINAL TX...HAS APPOINTMENT

## 2011-09-22 NOTE — Progress Notes (Signed)
Weekly Management Note:  Site:L Breast Current Dose:  6000  cGy Projected Dose: 6000  cGy  Narrative: The patient is seen today for routine under treatment assessment. CBCT/MVCT images/port films were reviewed. The chart was reviewed.   She does report some difficulty swallowing along with coughing particularly drinking liquids. She continues with her Radioplex gel. She is finished with her treatment today.  Physical Examination: There were no vitals filed for this visit..  Weight: 216 lb 11.2 oz (98.294 kg). There is moderate to marked erythema the skin, particularly along the inframammary region.  Impression: Tolerating radiation therapy well. I cannot explain why she is having difficulty with swallowing since her tangential fields do not exit through her esophagus or major airways.   Plan: Radiation therapy completed, followup here in one month.

## 2011-09-23 ENCOUNTER — Inpatient Hospital Stay
Admission: RE | Admit: 2011-09-23 | Discharge: 2011-09-23 | Payer: Self-pay | Source: Ambulatory Visit | Attending: Radiation Oncology | Admitting: Radiation Oncology

## 2011-09-23 DIAGNOSIS — C50919 Malignant neoplasm of unspecified site of unspecified female breast: Secondary | ICD-10-CM

## 2011-09-25 ENCOUNTER — Encounter: Payer: Self-pay | Admitting: Radiation Oncology

## 2011-09-25 NOTE — Progress Notes (Signed)
Chart note: At the time of for simulation/treatment planning on January 14 she had construction of 2 unique multileaf collimators to conform her field.

## 2011-09-25 NOTE — Progress Notes (Addendum)
[facility] Radiation Oncology  Name:Dawn Bradley  Date:09/25/2011           WUJ:[id number] DOB:[birth year]   Status:outpatient    CC: Minda Meo, MD, MD  Robyne Askew, MD   REFERRING PHYSICIAN: Robyne Askew, MD      DIAGNOSIS:  Stage 0/DCIS of the left breast  INDICATION FOR TREATMENT: Curative   TREATMENT DATES: 08/12/2011 through 09/22/2011                          SITE/DOSE:  Left breast 5000 cGy 25 sessions left breast boost 1000 cGy 5 sessions                          BEAMS/ENERGY:   6 MV photons tangential fields with deep inspiration/breath-hold to avoid cardiac irradiation.   18 MEV electrons, left breast boost             NARRATIVE:  The patient tolerated treatment beautifully although she develop moderate to severe erythema the skin   with impending dry desquamation by completion of therapy. She was treated with Radioplex gel during her course of therapy.                       PLAN: Routine followup in one month. Patient instructed to call if questions or worsening complaints in interim.

## 2011-10-08 DIAGNOSIS — I10 Essential (primary) hypertension: Secondary | ICD-10-CM | POA: Diagnosis not present

## 2011-10-08 DIAGNOSIS — E785 Hyperlipidemia, unspecified: Secondary | ICD-10-CM | POA: Diagnosis not present

## 2011-10-08 DIAGNOSIS — C50919 Malignant neoplasm of unspecified site of unspecified female breast: Secondary | ICD-10-CM | POA: Diagnosis not present

## 2011-10-08 DIAGNOSIS — E119 Type 2 diabetes mellitus without complications: Secondary | ICD-10-CM | POA: Diagnosis not present

## 2011-10-28 ENCOUNTER — Ambulatory Visit
Admission: RE | Admit: 2011-10-28 | Discharge: 2011-10-28 | Disposition: A | Discharge status: Home / Self Care | Payer: Medicare Other | Source: Ambulatory Visit | Attending: Radiation Oncology | Admitting: Radiation Oncology

## 2011-10-28 ENCOUNTER — Encounter: Payer: Self-pay | Admitting: Radiation Oncology

## 2011-10-28 VITALS — BP 135/74 | HR 67 | Temp 97.5°F | Resp 18 | Wt 217.6 lb

## 2011-10-28 DIAGNOSIS — C50919 Malignant neoplasm of unspecified site of unspecified female breast: Secondary | ICD-10-CM

## 2011-10-28 NOTE — Progress Notes (Signed)
Followup note:  The patient returns today approximately 1 month following completion of radiation therapy following conservative surgery in the management of her DCIS of the left breast. She is without complaints today. She states that she still has her "same old  lump under her arm". She has started adjuvant letrozole under the direction of Dr. Darnelle Catalan will see her again in May. She will see Dr. Carolynne Edouard in early May as well. She tells me she is scheduled for her mammography in October with Dr. Yolanda Bonine at [place].  Physical examination: Nodes: There is no palpable cervical, supraclavicular, or axillary lymphadenopathy. The previously noted mass within the lower left axilla remains in approximately 1.5 cm is felt to represent a small lymphocele or seroma from her sentinel lymph node biopsy. I believe that this is somewhat smaller than it was when I examined her just over a month ago. Breasts there is slight erythema along the left breast with no dominant masses or lesions. Right breast without masses or lesions. Extremities without edema.  Impression: Satisfactory progress.  Plan: She'll maintain followup with Dr. Darnelle Catalan and Dr. Carolynne Edouard in May. She will have mammography with Dr. Yolanda Bonine in October. I've not scheduled her for a formal followup visit with me.

## 2011-10-28 NOTE — Progress Notes (Signed)
Patient presents to the clinic today accompanied by her husband for a follow up appointment with Dr. Dayton Scrape. Patient is alert and oriented to person, place, and time. No distress noted. Steady gait noted. Pleasant affect noted. Patient denies pain at this time. Patient reports that she is sleeping well. Patient reports that on average once a day she feels a brief sharp shooting pain in left/treated breast. Patient reports "I still feel that lump under my left arm in the side of my breast that has always been there." Patient denies nausea, vomiting, headache, dizziness, or diarrhea. Patient reports taking letrozole daily without complications. Patient reports that she is on a medical fast under the care of a doctor. Patient reports she has been doing yoga once a week now. Patient reports that May 7 she is scheduled to follow up with Dr. Darnelle Catalan and Dr. Dayton Scrape. Patient is scheduled to follow up with Dr. Carolynne Edouard May 8th. Reported all findings to Dr. Dayton Scrape.

## 2011-11-25 ENCOUNTER — Ambulatory Visit (HOSPITAL_BASED_OUTPATIENT_CLINIC_OR_DEPARTMENT_OTHER): Payer: Medicare Other | Admitting: Oncology

## 2011-11-25 ENCOUNTER — Telehealth: Payer: Self-pay | Admitting: *Deleted

## 2011-11-25 ENCOUNTER — Other Ambulatory Visit: Payer: Self-pay | Admitting: Oncology

## 2011-11-25 VITALS — BP 131/81 | HR 75 | Temp 97.8°F | Ht 65.0 in | Wt 218.5 lb

## 2011-11-25 DIAGNOSIS — C50919 Malignant neoplasm of unspecified site of unspecified female breast: Secondary | ICD-10-CM

## 2011-11-25 DIAGNOSIS — D0590 Unspecified type of carcinoma in situ of unspecified breast: Secondary | ICD-10-CM | POA: Diagnosis not present

## 2011-11-25 DIAGNOSIS — Z170 Estrogen receptor positive status [ER+]: Secondary | ICD-10-CM | POA: Diagnosis not present

## 2011-11-25 MED ORDER — GABAPENTIN 300 MG PO CAPS: 300.0000 mg | ORAL_CAPSULE | Freq: Two times a day (BID) | ORAL | Status: DC | PRN

## 2011-11-25 NOTE — Telephone Encounter (Signed)
gave patient appointment for 10-2012 called dr.aronson's office left voice mail to have patient set up for an bone density test in 08-2012 printed out calendar and gave to the patient

## 2011-11-25 NOTE — Progress Notes (Signed)
ID: Weyman Croon Brumbaugh   DOB: [birth year]  MR#: [id number]  JXB#:[id number]  HISTORY OF PRESENT ILLNESS: Dawn Bradley underwent routine screening mammography at [facility] 05/13/2011: new linear microcalcifications were noted in the posterior mid left breast and she was brought back for additional imaging October 25. This showed a 2 cm cluster of pleomorphic calcifications with associated mass or architectural distortion. Biopsy was performed October 30 and showed ([id number]) ductal carcinoma in situ, grade 3, strongly estrogen receptor positive at 99%, also progesterone receptor positive at 72%.   Bilateral breast MRIs were obtained at [facility] 05/27/2011. This showed an area of non-masslike clumped linear enhancement centrally and posteriorly in the left breast measuring 3.9 cm.  There were no other enhancing lesions and no findings in the right breast of any concern.  There was no evidence of adenopathy. In November 2012 she underwent left lumpectomy and sentinel lymph node sampling, followed by a second surgery for margin clearance 06/27/2011. Her subsequent history is as noted below.  INTERVAL HISTORY: Dawn Bradley returns today with her husband for followup of her breast cancer. She completed her radiation treatments in March. She decided against tamoxifen and started letrozole April of this year. So far she is tolerating it well, with no hot flashes and no problems with vaginal dryness.  REVIEW OF SYSTEMS: She has a "lump" in her left axilla which she understands is not cancer but is postsurgical. There is also some irregularity in her left breast scar. These things can be of uncomfortable, and she is taking Advil up to 4 times a day sometimes because of pain in the breast, particularly around the nipple. She also has some headaches, which are not new, occasional dry cough, again not a new problem. She has poor sleep habits, occasionally taking naps, and waking up in the morning and various times. We  discussed that at length today she has minimal joint pain in her hands and fingers, which we will need to monitor since of course letrozole can cause a variety of arthralgias and myalgias. Otherwise a detailed review of systems today was negative  PAST MEDICAL HISTORY: Past Medical History  Diagnosis Date  . GERD (gastroesophageal reflux disease)   . Hypertension   . Hyperlipidemia   . Hypothyroid   . Osteoarthritis   . Cancer     lt breast  . Diabetes mellitus     pre-diabetic    PAST SURGICAL HISTORY: Past Surgical History  Procedure Date  . Appendectomy 1962  . Cholecystectomy 1971  . Anterior cruciate ligament repair 1960  . Tubal ligation 1980  . Replacement total knee bilateral 09/06/10  . Joint replacement     both knees 2/12  . Breast lumpectomy 06/11/2011    Procedure: LUMPECTOMY;  Surgeon: Robyne Askew, MD;  Location: [facility];  Service: General;  Laterality: Left;  Left Needle localization lumpectomy    FAMILY HISTORY Family History  Problem Relation Age of Onset  . Breast cancer Sister   . Cancer Sister     breast  . 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 Her husband of 50 years Dawn Bradley is also retired. Their children are at daughter Dawn Bradley, 63, a homemaker in [place], Dawn Bradley 47 a business executive in [place] and Dawn Bradley 41 lives in [place] and is a homemaker. The patient attends [employer].    ADVANCED DIRECTIVES: in place  HEALTH MAINTENANCE: History  Substance Use Topics  . Smoking  status: Former Games developer  . Smokeless tobacco: Not on file   Comment: smoked in college  . Alcohol Use: Yes   Cholesterol under treatment  Bone density normal 2011  Colonoscopy normal 2008  (PAP) 2010    Allergies  Allergen Reactions  . Shellfish Allergy Hives and Swelling  . Codeine     REACTION: heart races  . Tramadol Nausea And Vomiting    Current Outpatient Prescriptions  Medication Sig Dispense Refill  . diazepam (VALIUM) 1 MG/ML solution Take 1 mg by mouth every 8 (eight) hours as needed.       Marland Kitchen ibuprofen (ADVIL,MOTRIN) 200 MG tablet Take 200 mg by mouth every 6 (six) hours as needed.      Marland Kitchen letrozole (FEMARA) 2.5 MG tablet Take 2.5 mg by mouth daily.      . Levothyroxine Sodium (SYNTHROID PO) Take 88 mcg by mouth daily.       Marland Kitchen lisinopril (PRINIVIL,ZESTRIL) 10 MG tablet Take 10 mg by mouth daily.       . non-metallic deodorant Thornton Papas) MISC Apply 1 application topically daily as needed.      . simvastatin (ZOCOR) 10 MG tablet Take 20 mg by mouth at bedtime.       . Vitamin D, Ergocalciferol, (DRISDOL) 50000 UNITS CAPS Take 50,000 Units by mouth.        . Wound Cleansers (RADIAPLEX EX) Apply topically.      Marland Kitchen zolpidem (AMBIEN) 5 MG tablet Take 5 mg by mouth at bedtime as needed.       . gabapentin (NEURONTIN) 300 MG capsule Take 1 capsule (300 mg total) by mouth 2 (two) times daily as needed.  60 capsule  12    OBJECTIVE: Middle-aged white woman in no acute distress Filed Vitals:   11/25/11 0933  BP: 131/81  Pulse: 75  Temp: 97.8 F (36.6 C)     Body mass index is 36.36 kg/(m^2).    ECOG FS: 1  Sclerae unicteric Oropharynx clear No peripheral adenopathy Lungs no rales or rhonchi Heart regular rate and rhythm Abd benign MSK no focal spinal tenderness, no peripheral edema Neuro: nonfocal Breasts: The right breast is unremarkable; the left breast is status post lumpectomy. There is minimal induration under the scar. There is a firm mass in the left axilla which is  going to be a postoperative seroma or hematoma.  LAB RESULTS: Lab Results  Component Value Date   WBC 6.3 05/28/2011   NEUTROABS 3.4 05/28/2011   HGB 12.4 06/27/2011   HCT 38.9 05/28/2011   MCV 90.8 05/28/2011   PLT 228 05/28/2011      Chemistry      Component Value Date/Time   NA 137 06/25/2011 1400   K 4.9 06/25/2011 1400   CL 102 06/25/2011 1400   CO2 23 06/25/2011 1400   BUN 23 06/25/2011 1400   CREATININE 0.70 06/25/2011 1400      Component Value Date/Time   CALCIUM 10.1 06/25/2011 1400   ALKPHOS 80 05/28/2011 1310   AST 26 05/28/2011 1310   ALT 27 05/28/2011 1310  BILITOT 0.4 05/28/2011 1310       No results found for this basename: LABCA2    No components found with this basename: [id number]    No results found for this basename: INR:1;PROTIME:1 in the last 168 hours  Urinalysis    Component Value Date/Time   COLORURINE YELLOW 12/31/2010 0935   APPEARANCEUR CLOUDY* 12/31/2010 0935   LABSPEC 1.019 12/31/2010 0935   PHURINE 5.0 12/31/2010 0935   GLUCOSEU NEGATIVE 12/31/2010 0935   HGBUR NEGATIVE 12/31/2010 0935   BILIRUBINUR NEGATIVE 12/31/2010 0935   KETONESUR NEGATIVE 12/31/2010 0935   PROTEINUR NEGATIVE 12/31/2010 0935   UROBILINOGEN 0.2 12/31/2010 0935   NITRITE NEGATIVE 12/31/2010 0935   LEUKOCYTESUR SMALL* 12/31/2010 0935    STUDIES: No new results found.  ASSESSMENT: 71 year old [place] woman status post left lumpectomy and sentinel node biopsy November 2012 (with a second procedure for margin clearance) for a ductal carcinoma in situ, grade 2, strongly estrogen and progesterone receptor positive, HER-2 negative. Completer radiation March 2013 and started letrozole April 2013   PLAN: She is tolerating the letrozole just fine, but she understands that we have alternatives in case she does develop arthralgias/myalgias. She understands also that the main issue with letrozole in his bone density loss, and I have encouraged her to start a walking program, as well as take  calcium and vitamin D twice daily. She had a normal bone density 2 years ago and we will ask Dr. Lanell Matar office to obtain a repeat bone density at some point between now and her next visit here which will be April of next year.  We discussed asleep toilet of issues and particularly avoiding naps, getting up at the same time every morning, including we can send medications, and keeping the room cold at night. She is taking quite a bit of Advil and I think she might be better off with gabapentin. I wrote her for 300 mg tablets to take one every night, and then one during the day if necessary. She understands these will make her a little sleepy. Otherwise she has non-life-threatening breast cancer and we will do lab work here. She will see me again in April of next year. She knows to call for any problems that may develop before the   Nissan Frazzini C    11/25/2011

## 2011-11-26 ENCOUNTER — Encounter (INDEPENDENT_AMBULATORY_CARE_PROVIDER_SITE_OTHER): Payer: Self-pay | Admitting: General Surgery

## 2011-11-26 ENCOUNTER — Ambulatory Visit (INDEPENDENT_AMBULATORY_CARE_PROVIDER_SITE_OTHER): Payer: Medicare Other | Admitting: General Surgery

## 2011-11-26 VITALS — BP 136/80 | HR 65 | Temp 97.4°F | Ht 65.0 in | Wt 216.0 lb

## 2011-11-26 DIAGNOSIS — C50919 Malignant neoplasm of unspecified site of unspecified female breast: Secondary | ICD-10-CM | POA: Diagnosis not present

## 2011-11-26 NOTE — Patient Instructions (Signed)
Continue regular self exams  

## 2011-11-27 NOTE — Progress Notes (Signed)
Subjective:     Patient ID: Dawn Bradley, female   DOB: [birth year], 71 y.o.   MRN: [id number]  HPI The patient is a 71 year old white female who is 6 months out from a left breast lumpectomy for DCIS. Since her last visit her only complaint is of a persistent cough. She also feels a knot in her left axilla which has been there since surgery pretty much.  Review of Systems  Constitutional: Negative.   HENT: Negative.   Eyes: Negative.   Respiratory: Positive for cough.   Cardiovascular: Negative.   Gastrointestinal: Negative.   Genitourinary: Negative.   Musculoskeletal: Negative.   Skin: Negative.   Neurological: Negative.   Hematological: Negative.   Psychiatric/Behavioral: Negative.        Objective:   Physical Exam  Constitutional: She is oriented to person, place, and time. She appears well-developed and well-nourished.  HENT:  Head: Normocephalic and atraumatic.  Eyes: Conjunctivae and EOM are normal. Pupils are equal, round, and reactive to light.  Neck: Normal range of motion. Neck supple.  Cardiovascular: Normal rate, regular rhythm and normal heart sounds.   Pulmonary/Chest: Effort normal and breath sounds normal.       There is some mild fullness beneath the left breast incision and secondary to radiation. Otherwise there is no palpable mass in either breast. No palpable axillary supraclavicular or cervical lymphadenopathy. She does have a little bit of a palpable seroma in the left axilla which is stable  Abdominal: Soft. Bowel sounds are normal. She exhibits no mass. There is no tenderness.  Musculoskeletal: Normal range of motion.  Lymphadenopathy:    She has no cervical adenopathy.  Neurological: She is alert and oriented to person, place, and time.  Skin: Skin is warm and dry.  Psychiatric: She has a normal mood and affect. Her behavior is normal.       Assessment:     6 months status post left breast lumpectomy for DCIS    Plan:     At this point  she will continue to take Femara daily. She will also continue to do regular self exams. We will plan to see her back in about 3 months

## 2011-12-02 DIAGNOSIS — E119 Type 2 diabetes mellitus without complications: Secondary | ICD-10-CM | POA: Diagnosis not present

## 2011-12-02 DIAGNOSIS — I10 Essential (primary) hypertension: Secondary | ICD-10-CM | POA: Diagnosis not present

## 2011-12-02 DIAGNOSIS — E039 Hypothyroidism, unspecified: Secondary | ICD-10-CM | POA: Diagnosis not present

## 2011-12-02 DIAGNOSIS — E785 Hyperlipidemia, unspecified: Secondary | ICD-10-CM | POA: Diagnosis not present

## 2012-03-04 DIAGNOSIS — Z711 Person with feared health complaint in whom no diagnosis is made: Secondary | ICD-10-CM | POA: Diagnosis not present

## 2012-04-05 ENCOUNTER — Ambulatory Visit (INDEPENDENT_AMBULATORY_CARE_PROVIDER_SITE_OTHER): Payer: Medicare Other | Admitting: General Surgery

## 2012-04-05 ENCOUNTER — Encounter (INDEPENDENT_AMBULATORY_CARE_PROVIDER_SITE_OTHER): Payer: Self-pay | Admitting: General Surgery

## 2012-04-05 DIAGNOSIS — C50919 Malignant neoplasm of unspecified site of unspecified female breast: Secondary | ICD-10-CM

## 2012-04-05 NOTE — Patient Instructions (Signed)
Continue femara until you see Dr. Darnelle Catalan Continue regular self exams

## 2012-04-05 NOTE — Progress Notes (Signed)
Subjective:     Patient ID: Dawn Bradley, female   DOB: 26-Dec-1940, 71 y.o.   MRN: [id number]  HPI The patient is a 71 year old white female who is 9 months status post left breast lumpectomy for DCIS. She continues to have some soreness in the left breast which is stable. She has some significant joint pain especially in her hands. She is taking Femara every day and otherwise seems to tolerate it well except for the joint aches.  Review of Systems  Constitutional: Negative.   HENT: Negative.   Eyes: Negative.   Respiratory: Negative.   Cardiovascular: Negative.   Gastrointestinal: Negative.   Genitourinary: Negative.   Musculoskeletal: Positive for arthralgias.  Skin: Negative.   Neurological: Negative.   Hematological: Negative.   Psychiatric/Behavioral: Negative.        Objective:   Physical Exam  Constitutional: She is oriented to person, place, and time. She appears well-developed and well-nourished.  HENT:  Head: Normocephalic and atraumatic.  Eyes: Conjunctivae normal and EOM are normal. Pupils are equal, round, and reactive to light.  Neck: Normal range of motion. Neck supple.  Cardiovascular: Normal rate, regular rhythm and normal heart sounds.   Pulmonary/Chest: Effort normal and breath sounds normal.       She has some thickening of the scar of her left breast secondary to radiation. Otherwise no palpable mass in either breast. She still has a small palpable seroma in the left axilla but no palpable axillary supraclavicular cervical lymphadenopathy  Abdominal: Soft. Bowel sounds are normal. She exhibits no mass. There is no tenderness.  Musculoskeletal: Normal range of motion.  Lymphadenopathy:    She has no cervical adenopathy.  Neurological: She is alert and oriented to person, place, and time.  Skin: Skin is warm and dry.  Psychiatric: She has a normal mood and affect. Her behavior is normal.       Assessment:     9 months status post left breast  lumpectomy for DCIS    Plan:     At this point she will continue to do regular self exams. She will continue to take Femara daily. I have encouraged her to speak with her oncologist about the joint aches and see if the feel it is necessary to try to switch her antiestrogen medicine. We will plan to see her back in about 6 months. We will also arrange for her to follow with Dr. Darnelle Catalan in the next 3 months  .

## 2012-05-18 DIAGNOSIS — R059 Cough, unspecified: Secondary | ICD-10-CM | POA: Diagnosis not present

## 2012-05-18 DIAGNOSIS — I10 Essential (primary) hypertension: Secondary | ICD-10-CM | POA: Diagnosis not present

## 2012-05-18 DIAGNOSIS — J069 Acute upper respiratory infection, unspecified: Secondary | ICD-10-CM | POA: Diagnosis not present

## 2012-05-18 DIAGNOSIS — R05 Cough: Secondary | ICD-10-CM | POA: Diagnosis not present

## 2012-05-21 DIAGNOSIS — Z853 Personal history of malignant neoplasm of breast: Secondary | ICD-10-CM | POA: Diagnosis not present

## 2012-05-26 DIAGNOSIS — Z09 Encounter for follow-up examination after completed treatment for conditions other than malignant neoplasm: Secondary | ICD-10-CM | POA: Diagnosis not present

## 2012-05-27 DIAGNOSIS — E559 Vitamin D deficiency, unspecified: Secondary | ICD-10-CM | POA: Diagnosis not present

## 2012-05-27 DIAGNOSIS — E119 Type 2 diabetes mellitus without complications: Secondary | ICD-10-CM | POA: Diagnosis not present

## 2012-05-27 DIAGNOSIS — E039 Hypothyroidism, unspecified: Secondary | ICD-10-CM | POA: Diagnosis not present

## 2012-05-27 DIAGNOSIS — I10 Essential (primary) hypertension: Secondary | ICD-10-CM | POA: Diagnosis not present

## 2012-05-27 DIAGNOSIS — Z0000 Encounter for general adult medical examination without abnormal findings: Secondary | ICD-10-CM | POA: Diagnosis not present

## 2012-06-09 DIAGNOSIS — Z23 Encounter for immunization: Secondary | ICD-10-CM | POA: Diagnosis not present

## 2012-06-09 DIAGNOSIS — I10 Essential (primary) hypertension: Secondary | ICD-10-CM | POA: Diagnosis not present

## 2012-06-09 DIAGNOSIS — E785 Hyperlipidemia, unspecified: Secondary | ICD-10-CM | POA: Diagnosis not present

## 2012-06-09 DIAGNOSIS — E119 Type 2 diabetes mellitus without complications: Secondary | ICD-10-CM | POA: Diagnosis not present

## 2012-06-09 DIAGNOSIS — Z0000 Encounter for general adult medical examination without abnormal findings: Secondary | ICD-10-CM | POA: Diagnosis not present

## 2012-06-21 DIAGNOSIS — Z1212 Encounter for screening for malignant neoplasm of rectum: Secondary | ICD-10-CM | POA: Diagnosis not present

## 2012-08-03 DIAGNOSIS — N6489 Other specified disorders of breast: Secondary | ICD-10-CM | POA: Diagnosis not present

## 2012-08-04 DIAGNOSIS — Z01419 Encounter for gynecological examination (general) (routine) without abnormal findings: Secondary | ICD-10-CM | POA: Diagnosis not present

## 2012-08-04 DIAGNOSIS — Z124 Encounter for screening for malignant neoplasm of cervix: Secondary | ICD-10-CM | POA: Diagnosis not present

## 2012-08-20 ENCOUNTER — Other Ambulatory Visit: Payer: Self-pay | Admitting: Dermatology

## 2012-08-20 DIAGNOSIS — I789 Disease of capillaries, unspecified: Secondary | ICD-10-CM | POA: Diagnosis not present

## 2012-08-20 DIAGNOSIS — D239 Other benign neoplasm of skin, unspecified: Secondary | ICD-10-CM | POA: Diagnosis not present

## 2012-08-20 DIAGNOSIS — D1801 Hemangioma of skin and subcutaneous tissue: Secondary | ICD-10-CM | POA: Diagnosis not present

## 2012-08-20 DIAGNOSIS — D2370 Other benign neoplasm of skin of unspecified lower limb, including hip: Secondary | ICD-10-CM | POA: Diagnosis not present

## 2012-08-20 DIAGNOSIS — D485 Neoplasm of uncertain behavior of skin: Secondary | ICD-10-CM | POA: Diagnosis not present

## 2012-08-20 DIAGNOSIS — L819 Disorder of pigmentation, unspecified: Secondary | ICD-10-CM | POA: Diagnosis not present

## 2012-09-13 ENCOUNTER — Ambulatory Visit (INDEPENDENT_AMBULATORY_CARE_PROVIDER_SITE_OTHER): Payer: Medicare Other | Admitting: General Surgery

## 2012-09-13 ENCOUNTER — Encounter (INDEPENDENT_AMBULATORY_CARE_PROVIDER_SITE_OTHER): Payer: Self-pay | Admitting: General Surgery

## 2012-09-13 VITALS — BP 148/82 | HR 68 | Temp 97.3°F | Resp 12 | Ht 64.0 in | Wt 222.8 lb

## 2012-09-13 DIAGNOSIS — N61 Mastitis without abscess: Secondary | ICD-10-CM

## 2012-09-13 MED ORDER — DOXYCYCLINE HYCLATE 100 MG PO TABS: 100.0000 mg | ORAL_TABLET | Freq: Two times a day (BID) | ORAL | Status: DC

## 2012-09-13 NOTE — Patient Instructions (Signed)
Will get ultrasound of left breast  Will start doxycycline

## 2012-09-13 NOTE — Progress Notes (Signed)
Subjective:     Patient ID: Dawn Bradley, female   DOB: [birth year], 72 y.o.   MRN: [id number]  HPI The patient is a 72 year old white female who is one year and 3 months status post left breast lumpectomy for DCIS. She is taking Femara and tolerated it well. Over the last couple weeks she has developed some increased pain and redness in the left breast. She took one large dose of amoxicillin 2 days ago and this seemed to help. She denies any discharge from her nipple.  Review of Systems  Constitutional: Negative.   HENT: Negative.   Eyes: Negative.   Respiratory: Negative.   Cardiovascular: Negative.   Gastrointestinal: Negative.   Endocrine: Negative.   Genitourinary: Negative.   Musculoskeletal: Negative.   Skin: Negative.   Allergic/Immunologic: Negative.   Neurological: Negative.   Hematological: Negative.   Psychiatric/Behavioral: Negative.        Objective:   Physical Exam  Constitutional: She is oriented to person, place, and time. She appears well-developed and well-nourished.  HENT:  Head: Normocephalic and atraumatic.  Eyes: Conjunctivae and EOM are normal. Pupils are equal, round, and reactive to light.  Neck: Normal range of motion. Neck supple.  Cardiovascular: Normal rate, regular rhythm and normal heart sounds.   Pulmonary/Chest: Effort normal and breath sounds normal.  She continues to have some palpable fullness beneath her left lumpectomy incision as well as her left axilla where her lymph nodes were sampled. There is no palpable mass in the right breast. There is no palpable axillary or supraclavicular cervical lymphadenopathy  Abdominal: Soft. Bowel sounds are normal. She exhibits no mass. There is no tenderness.  Musculoskeletal: Normal range of motion.  Lymphadenopathy:    She has no cervical adenopathy.  Neurological: She is alert and oriented to person, place, and time.  Skin: Skin is warm and dry.  Psychiatric: She has a normal mood and affect.  Her behavior is normal.       Assessment:     The patient is one year and 3 months status post left breast lumpectomy for DCIS     Plan:     At this point she will continue to take Femara. We will also place her on a week's worth of doxycycline for mastitis. I will also ask [facility] to ultrasound her left breast to make sure the mass that we are appreciating is scar tissue and residual seroma. We will plan to see her back in one month to check her progress

## 2012-09-16 DIAGNOSIS — Z853 Personal history of malignant neoplasm of breast: Secondary | ICD-10-CM | POA: Diagnosis not present

## 2012-09-20 DIAGNOSIS — E119 Type 2 diabetes mellitus without complications: Secondary | ICD-10-CM | POA: Diagnosis not present

## 2012-09-20 DIAGNOSIS — I10 Essential (primary) hypertension: Secondary | ICD-10-CM | POA: Diagnosis not present

## 2012-09-20 DIAGNOSIS — E039 Hypothyroidism, unspecified: Secondary | ICD-10-CM | POA: Diagnosis not present

## 2012-09-20 DIAGNOSIS — E785 Hyperlipidemia, unspecified: Secondary | ICD-10-CM | POA: Diagnosis not present

## 2012-09-23 ENCOUNTER — Other Ambulatory Visit: Payer: Self-pay | Admitting: *Deleted

## 2012-09-23 DIAGNOSIS — C50919 Malignant neoplasm of unspecified site of unspecified female breast: Secondary | ICD-10-CM

## 2012-09-23 MED ORDER — LETROZOLE 2.5 MG PO TABS: 2.5000 mg | ORAL_TABLET | Freq: Every day | ORAL | Status: DC

## 2012-10-25 ENCOUNTER — Encounter (INDEPENDENT_AMBULATORY_CARE_PROVIDER_SITE_OTHER): Payer: Self-pay | Admitting: General Surgery

## 2012-10-25 ENCOUNTER — Ambulatory Visit (INDEPENDENT_AMBULATORY_CARE_PROVIDER_SITE_OTHER): Payer: Medicare Other | Admitting: General Surgery

## 2012-10-25 VITALS — BP 142/78 | HR 70 | Temp 99.0°F | Resp 18 | Ht 65.5 in | Wt 224.4 lb

## 2012-10-25 DIAGNOSIS — C50912 Malignant neoplasm of unspecified site of left female breast: Secondary | ICD-10-CM

## 2012-10-25 DIAGNOSIS — C50919 Malignant neoplasm of unspecified site of unspecified female breast: Secondary | ICD-10-CM | POA: Diagnosis not present

## 2012-10-25 NOTE — Patient Instructions (Signed)
Continue regular self exams Continue femara 

## 2012-10-26 ENCOUNTER — Encounter (INDEPENDENT_AMBULATORY_CARE_PROVIDER_SITE_OTHER): Payer: Self-pay | Admitting: General Surgery

## 2012-10-26 NOTE — Progress Notes (Signed)
Subjective:     Patient ID: Dawn Bradley, female   DOB: [birth year], 72 y.o.   MRN: [id number]  HPI The patient is a 72 year old white female who is one year in 5 months status post left breast lumpectomy for DCIS. At her last visit she did develop some cellulitic changes of the left breast. She was treated with doxycycline and this gradually improved. She denies any pain in the left breast. She feels as though the redness has resolved  Review of Systems  Constitutional: Negative.   HENT: Negative.   Eyes: Negative.   Respiratory: Negative.   Cardiovascular: Negative.   Gastrointestinal: Negative.   Endocrine: Negative.   Genitourinary: Negative.   Musculoskeletal: Negative.   Skin: Negative.   Allergic/Immunologic: Negative.   Neurological: Negative.   Hematological: Negative.   Psychiatric/Behavioral: Negative.        Objective:   Physical Exam  Constitutional: She is oriented to person, place, and time. She appears well-developed and well-nourished.  HENT:  Head: Normocephalic and atraumatic.  Eyes: Conjunctivae and EOM are normal. Pupils are equal, round, and reactive to light.  Neck: Normal range of motion. Neck supple.  Cardiovascular: Normal rate, regular rhythm and normal heart sounds.   Pulmonary/Chest: Effort normal and breath sounds normal.  Her left breast incision has healed nicely. The cellulitis has resolved. There is no palpable mass in either breast. There is no palpable axillary or supraclavicular cervical lymphadenopathy  Abdominal: Soft. Bowel sounds are normal. She exhibits no mass. There is no tenderness.  Musculoskeletal: Normal range of motion.  Lymphadenopathy:    She has no cervical adenopathy.  Neurological: She is alert and oriented to person, place, and time.  Skin: Skin is warm and dry.  Psychiatric: She has a normal mood and affect. Her behavior is normal.       Assessment:     The patient is one year in 5 months status post left  lumpectomy for DCIS     Plan:     At this point she will continue to take letrozole. She will continue to do regular self exams. We will plan to see her back in about 3 months.

## 2012-10-28 ENCOUNTER — Telehealth: Payer: Self-pay | Admitting: *Deleted

## 2012-10-28 ENCOUNTER — Ambulatory Visit (HOSPITAL_BASED_OUTPATIENT_CLINIC_OR_DEPARTMENT_OTHER): Payer: Medicare Other | Admitting: Oncology

## 2012-10-28 VITALS — BP 146/84 | HR 73 | Temp 98.0°F | Resp 20 | Ht 65.0 in | Wt 223.6 lb

## 2012-10-28 DIAGNOSIS — Z853 Personal history of malignant neoplasm of breast: Secondary | ICD-10-CM

## 2012-10-28 DIAGNOSIS — C50912 Malignant neoplasm of unspecified site of left female breast: Secondary | ICD-10-CM

## 2012-10-28 NOTE — Progress Notes (Signed)
ID: [name]   DOB: 09/23/1940  MR#: [id number]  WUJ#:[id number]  HISTORY OF PRESENT ILLNESS: Jetta underwent routine screening mammography at [facility] 05/13/2011: new linear microcalcifications were noted in the posterior mid left breast and she was brought back for additional imaging October 25. This showed a 2 cm cluster of pleomorphic calcifications with associated mass or architectural distortion. Biopsy was performed October 30 and showed ([id number]) ductal carcinoma in situ, grade 3, strongly estrogen receptor positive at 99%, also progesterone receptor positive at 72%.   Bilateral breast MRIs were obtained at [facility] imaging 05/27/2011. This showed an area of non-masslike clumped linear enhancement centrally and posteriorly in the left breast measuring 3.9 cm.  There were no other enhancing lesions and no findings in the right breast of any concern.  There was no evidence of adenopathy. In November 2012 she underwent left lumpectomy and sentinel lymph node sampling, followed by a second surgery for margin clearance 06/27/2011. Her subsequent history is as noted below.  INTERVAL HISTORY: Talesha returns today with her husband for followup of her breast cancer. The interval history is generally unremarkable. She plays bridge, is adjusted for [facility] as history cobalt club, and spends time with the grandchildren.  REVIEW OF SYSTEMS: She has noted a little bit more arthritis in her fingers, which affects her P. no playing. She wonders if letrozole is the cause of that. She has a sleep disturbance, which is chronic. She bruises easily. She is now on metformin for type 2 diabetes. 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 When she walks more than 20 minutes she gets low back pain. There is no radiation. Otherwise a detailed review of systems today was noncontributory.  PAST MEDICAL HISTORY: Past Medical History  Diagnosis Date  . GERD  (gastroesophageal reflux disease)   . Hypertension   . Hyperlipidemia   . Hypothyroid   . Osteoarthritis   . Cancer     lt breast  . Diabetes mellitus     pre-diabetic    PAST SURGICAL HISTORY: Past Surgical History  Procedure Laterality Date  . Appendectomy  1962  . Cholecystectomy  1971  . Anterior cruciate ligament repair  1960  . Tubal ligation  1980  . Replacement total knee bilateral  09/06/10  . Joint replacement      both knees 2/12  . Breast lumpectomy  06/11/2011    Procedure: LUMPECTOMY;  Surgeon: Robyne Askew, MD;  Location: [facility];  Service: General;  Laterality: Left;  Left Needle localization lumpectomy    FAMILY HISTORY Family History  Problem Relation Age of Onset  . Breast cancer Sister   . Cancer Sister     breast  . 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 Her husband of 50 years Gowri Suchan is also retired. Their children are at daughter Willaim Bane, 93, a homemaker in [place], Augustin Schooling 47 a business executive in [place] and Garvin Fila 41 lives in [place] and is  a homemaker. The patient attends [employer].    ADVANCED DIRECTIVES: in place  HEALTH MAINTENANCE: History  Substance Use Topics  . Smoking status: Former Games developer  . Smokeless tobacco: Not on file     Comment: smoked in college  . Alcohol Use: Yes   Cholesterol under treatment  Bone density normal 2011  Colonoscopy normal 2008  (PAP) 2010    Allergies  Allergen Reactions  . Shellfish Allergy Hives and Swelling  . Codeine      REACTION: heart races  . Tramadol Nausea And Vomiting    Current Outpatient Prescriptions  Medication Sig Dispense Refill  . ibuprofen (ADVIL,MOTRIN) 200 MG tablet Take 200 mg by mouth every 6 (six) hours as needed.      Marland Kitchen letrozole (FEMARA) 2.5 MG tablet Take 1 tablet (2.5 mg total) by mouth daily.  90 tablet  0  . Levothyroxine Sodium (SYNTHROID PO) Take 88 mcg by mouth daily.       Marland Kitchen lisinopril (PRINIVIL,ZESTRIL) 10 MG tablet Take 10 mg by mouth daily.       . metformin (FORTAMET) 500 MG (OSM) 24 hr tablet Take 500 mg by mouth 2 (two) times daily with a meal.      . non-metallic deodorant (ALRA) MISC Apply 1 application topically daily as needed.      . simvastatin (ZOCOR) 10 MG tablet Take 20 mg by mouth at bedtime.       . Vitamin D, Ergocalciferol, (DRISDOL) 50000 UNITS CAPS Take 50,000 Units by mouth.        . zolpidem (AMBIEN) 5 MG tablet Take 5 mg by mouth at bedtime as needed.        No current facility-administered medications for this visit.    OBJECTIVE: Middle-aged white woman in no acute distress Filed Vitals:   10/28/12 0925  BP: 146/84  Pulse: 73  Temp: 98 F (36.7 C)  Resp: 20     Body mass index is 37.21 kg/(m^2).    ECOG FS: 1  Sclerae unicteric Oropharynx clear No peripheral adenopathy Lungs no rales or rhonchi Heart regular rate and rhythm Abd benign MSK no focal spinal tenderness, no peripheral edema Neuro: nonfocal, well oriented, pleasant affect Breasts: The right breast is unremarkable; the left breast is status post lumpectomy. There is a mass in the left axilla consistent with a postoperative seroma or hematoma. This is unchanged from prior  LAB RESULTS: Lab Results  Component Value Date   WBC 6.3 05/28/2011   NEUTROABS 3.4 05/28/2011   HGB 12.4 06/27/2011   HCT 38.9 05/28/2011   MCV 90.8 05/28/2011   PLT 228 05/28/2011      Chemistry      Component Value Date/Time   NA 137 06/25/2011 1400   K 4.9 06/25/2011 1400   CL 102 06/25/2011 1400    CO2 23 06/25/2011 1400   BUN 23 06/25/2011 1400   CREATININE 0.70 06/25/2011 1400      Component Value Date/Time   CALCIUM 10.1 06/25/2011 1400   ALKPHOS 80 05/28/2011 1310   AST 26 05/28/2011 1310   ALT 27 05/28/2011 1310   BILITOT 0.4 05/28/2011 1310       No results found for this basename: LABCA2    No components found with this basename: LABCA125    No results found for this basename: INR,  in the last 168 hours  Urinalysis    Component Value Date/Time   COLORURINE YELLOW 12/31/2010 0935   APPEARANCEUR CLOUDY* 12/31/2010 0935  LABSPEC 1.019 12/31/2010 0935   PHURINE 5.0 12/31/2010 0935   GLUCOSEU NEGATIVE 12/31/2010 0935   HGBUR NEGATIVE 12/31/2010 0935   BILIRUBINUR NEGATIVE 12/31/2010 0935   KETONESUR NEGATIVE 12/31/2010 0935   PROTEINUR NEGATIVE 12/31/2010 0935   UROBILINOGEN 0.2 12/31/2010 0935   NITRITE NEGATIVE 12/31/2010 0935   LEUKOCYTESUR SMALL* 12/31/2010 0935    STUDIES: Mammography at [facility] 05/28/2012 was unremarkable. Left breast ultrasonography 09/16/2012 again showed a 4-1/2 cm seroma associated with the left lumpectomy site.  ASSESSMENT: 72 y.o. [place] woman status post left lumpectomy and sentinel node biopsy November 2012 (with a second procedure for margin clearance) for a ductal carcinoma in situ, grade 2, strongly estrogen and progesterone receptor positive, HER-2 negative. Completed radiation March 2013 and started letrozole April 2013   PLAN: Nakiesha understands her tumor was noninvasive and therefore the antiestrogen therapy is more preventive than anything else. If she really feels of the letrozole is affecting her hands the best thing to do is to get off it for 2 months and then reassess. If there has been no significant change she will go back on letrozole;  if she feels better off it then it is okay for her to stay off and we'll discuss it further at the next visit.  What is really on her mind is the weight issue. I think she is in the common spiral  where she hurts when she exercises so she doesn't so she gained more weight since her to more when she exercises and so on. Starting with water exercises seems to me a good idea. I think a good goal for her would be to lose 10-15 pounds over the next year and we went over the very simple equation which means calories in and minus calories out equals weight change. If she became serious about this I think within 5 years she could lose 60 pounds, possibly get off her diabetic medication, and become much more ambulatory. We will review her weight at the next visit, which will be in one year. She knows to call for any problems that may develop before then.   Brynna Dobos C    10/28/2012

## 2012-10-28 NOTE — Telephone Encounter (Signed)
appts made and printed 

## 2012-11-17 DIAGNOSIS — E669 Obesity, unspecified: Secondary | ICD-10-CM | POA: Diagnosis not present

## 2012-11-17 DIAGNOSIS — I10 Essential (primary) hypertension: Secondary | ICD-10-CM | POA: Diagnosis not present

## 2012-11-17 DIAGNOSIS — Z1331 Encounter for screening for depression: Secondary | ICD-10-CM | POA: Diagnosis not present

## 2012-11-17 DIAGNOSIS — E039 Hypothyroidism, unspecified: Secondary | ICD-10-CM | POA: Diagnosis not present

## 2012-11-17 DIAGNOSIS — Z6838 Body mass index (BMI) 38.0-38.9, adult: Secondary | ICD-10-CM | POA: Diagnosis not present

## 2012-11-17 DIAGNOSIS — E119 Type 2 diabetes mellitus without complications: Secondary | ICD-10-CM | POA: Diagnosis not present

## 2012-11-17 DIAGNOSIS — E785 Hyperlipidemia, unspecified: Secondary | ICD-10-CM | POA: Diagnosis not present

## 2012-12-16 DIAGNOSIS — E1139 Type 2 diabetes mellitus with other diabetic ophthalmic complication: Secondary | ICD-10-CM | POA: Diagnosis not present

## 2012-12-20 ENCOUNTER — Other Ambulatory Visit: Payer: Self-pay | Admitting: *Deleted

## 2012-12-20 DIAGNOSIS — C50919 Malignant neoplasm of unspecified site of unspecified female breast: Secondary | ICD-10-CM

## 2012-12-20 MED ORDER — LETROZOLE 2.5 MG PO TABS: 2.5000 mg | ORAL_TABLET | Freq: Every day | ORAL | Status: DC

## 2013-02-28 DIAGNOSIS — E039 Hypothyroidism, unspecified: Secondary | ICD-10-CM | POA: Diagnosis not present

## 2013-02-28 DIAGNOSIS — E119 Type 2 diabetes mellitus without complications: Secondary | ICD-10-CM | POA: Diagnosis not present

## 2013-02-28 DIAGNOSIS — Z6839 Body mass index (BMI) 39.0-39.9, adult: Secondary | ICD-10-CM | POA: Diagnosis not present

## 2013-02-28 DIAGNOSIS — E669 Obesity, unspecified: Secondary | ICD-10-CM | POA: Diagnosis not present

## 2013-02-28 DIAGNOSIS — I10 Essential (primary) hypertension: Secondary | ICD-10-CM | POA: Diagnosis not present

## 2013-02-28 DIAGNOSIS — E785 Hyperlipidemia, unspecified: Secondary | ICD-10-CM | POA: Diagnosis not present

## 2013-05-19 DIAGNOSIS — Z853 Personal history of malignant neoplasm of breast: Secondary | ICD-10-CM | POA: Diagnosis not present

## 2013-06-08 DIAGNOSIS — Z0000 Encounter for general adult medical examination without abnormal findings: Secondary | ICD-10-CM | POA: Diagnosis not present

## 2013-06-08 DIAGNOSIS — E119 Type 2 diabetes mellitus without complications: Secondary | ICD-10-CM | POA: Diagnosis not present

## 2013-06-08 DIAGNOSIS — E785 Hyperlipidemia, unspecified: Secondary | ICD-10-CM | POA: Diagnosis not present

## 2013-06-08 DIAGNOSIS — E559 Vitamin D deficiency, unspecified: Secondary | ICD-10-CM | POA: Diagnosis not present

## 2013-06-08 DIAGNOSIS — E039 Hypothyroidism, unspecified: Secondary | ICD-10-CM | POA: Diagnosis not present

## 2013-06-08 DIAGNOSIS — H35379 Puckering of macula, unspecified eye: Secondary | ICD-10-CM | POA: Diagnosis not present

## 2013-06-22 DIAGNOSIS — E669 Obesity, unspecified: Secondary | ICD-10-CM | POA: Diagnosis not present

## 2013-06-22 DIAGNOSIS — E119 Type 2 diabetes mellitus without complications: Secondary | ICD-10-CM | POA: Diagnosis not present

## 2013-06-22 DIAGNOSIS — Z23 Encounter for immunization: Secondary | ICD-10-CM | POA: Diagnosis not present

## 2013-06-22 DIAGNOSIS — E785 Hyperlipidemia, unspecified: Secondary | ICD-10-CM | POA: Diagnosis not present

## 2013-06-22 DIAGNOSIS — E039 Hypothyroidism, unspecified: Secondary | ICD-10-CM | POA: Diagnosis not present

## 2013-06-22 DIAGNOSIS — I10 Essential (primary) hypertension: Secondary | ICD-10-CM | POA: Diagnosis not present

## 2013-06-22 DIAGNOSIS — Z1331 Encounter for screening for depression: Secondary | ICD-10-CM | POA: Diagnosis not present

## 2013-06-22 DIAGNOSIS — Z0000 Encounter for general adult medical examination without abnormal findings: Secondary | ICD-10-CM | POA: Diagnosis not present

## 2013-07-27 DIAGNOSIS — M949 Disorder of cartilage, unspecified: Secondary | ICD-10-CM | POA: Diagnosis not present

## 2013-07-27 DIAGNOSIS — M899 Disorder of bone, unspecified: Secondary | ICD-10-CM | POA: Diagnosis not present

## 2013-07-27 DIAGNOSIS — E559 Vitamin D deficiency, unspecified: Secondary | ICD-10-CM | POA: Diagnosis not present

## 2013-09-26 DIAGNOSIS — D692 Other nonthrombocytopenic purpura: Secondary | ICD-10-CM | POA: Diagnosis not present

## 2013-09-26 DIAGNOSIS — B009 Herpesviral infection, unspecified: Secondary | ICD-10-CM | POA: Diagnosis not present

## 2013-09-26 DIAGNOSIS — D239 Other benign neoplasm of skin, unspecified: Secondary | ICD-10-CM | POA: Diagnosis not present

## 2013-09-26 DIAGNOSIS — D1801 Hemangioma of skin and subcutaneous tissue: Secondary | ICD-10-CM | POA: Diagnosis not present

## 2013-10-13 DIAGNOSIS — Z01419 Encounter for gynecological examination (general) (routine) without abnormal findings: Secondary | ICD-10-CM | POA: Diagnosis not present

## 2013-10-13 DIAGNOSIS — Z124 Encounter for screening for malignant neoplasm of cervix: Secondary | ICD-10-CM | POA: Diagnosis not present

## 2013-10-13 DIAGNOSIS — Z87410 Personal history of cervical dysplasia: Secondary | ICD-10-CM | POA: Diagnosis not present

## 2013-10-31 ENCOUNTER — Ambulatory Visit: Payer: Medicare Other | Admitting: Oncology

## 2013-11-01 ENCOUNTER — Ambulatory Visit (HOSPITAL_BASED_OUTPATIENT_CLINIC_OR_DEPARTMENT_OTHER): Payer: Medicare Other | Admitting: Oncology

## 2013-11-01 ENCOUNTER — Telehealth: Payer: Self-pay | Admitting: Oncology

## 2013-11-01 VITALS — BP 145/79 | HR 72 | Temp 98.4°F | Resp 18 | Ht 65.0 in | Wt 226.4 lb

## 2013-11-01 DIAGNOSIS — D0590 Unspecified type of carcinoma in situ of unspecified breast: Secondary | ICD-10-CM | POA: Diagnosis not present

## 2013-11-01 DIAGNOSIS — Z79811 Long term (current) use of aromatase inhibitors: Secondary | ICD-10-CM | POA: Diagnosis not present

## 2013-11-01 DIAGNOSIS — C50919 Malignant neoplasm of unspecified site of unspecified female breast: Secondary | ICD-10-CM

## 2013-11-01 DIAGNOSIS — Z170 Estrogen receptor positive status [ER+]: Secondary | ICD-10-CM

## 2013-11-01 NOTE — Telephone Encounter (Signed)
gv pt appt schedule for april 2016.  °

## 2013-11-01 NOTE — Progress Notes (Signed)
ID: Dawn Bradley   DOB: Feb 15, 1941  MR#: [id number]  GUR#:[id number]  PCP: Geoffery Lyons, MD GYN: SU:  OTHER MD:   HISTORY OF PRESENT ILLNESS: Tearsa underwent routine screening mammography at [facility] 05/13/2011: new linear microcalcifications were noted in the posterior mid left breast and she was brought back for additional imaging October 25. This showed a 2 cm cluster of pleomorphic calcifications with associated mass or architectural distortion. Biopsy was performed October 30 and showed ([id number]) ductal carcinoma in situ, grade 3, strongly estrogen receptor positive at 99%, also progesterone receptor positive at 72%.   Bilateral breast MRIs were obtained at [facility] imaging 05/27/2011. This showed an area of non-masslike clumped linear enhancement centrally and posteriorly in the left breast measuring 3.9 cm.  There were no other enhancing lesions and no findings in the right breast of any concern.  There was no evidence of adenopathy. In November 2012 she underwent left lumpectomy and sentinel lymph node sampling, followed by a second surgery for margin clearance 06/27/2011. Her subsequent history is as noted below.  INTERVAL HISTORY: Dawn Bradley returns today with her husband for followup of her breast cancer. The interval history is generally unremarkable. She plays bridge, does a lot of reading, and tolerates her letrozole.  REVIEW OF SYSTEMS: Her main concern is weight gain. She wonders if the letrozole is related. She enjoys food and is not willing to make a drastic change in her diet. At the same time everything she enjoys doing is sedentary. She does have exercise equipment spot does not use it. She continues to have problems with her knees and now little bit her ankles as well. Aside from that a detailed review of systems today was noncontributory and in particular hot flashes and vaginal dryness are not major concerns.  PAST MEDICAL HISTORY: Past Medical History  Diagnosis  Date  . GERD (gastroesophageal reflux disease)   . Hypertension   . Hyperlipidemia   . Hypothyroid   . Osteoarthritis   . Cancer     lt breast  . Diabetes mellitus     pre-diabetic    PAST SURGICAL HISTORY: Past Surgical History  Procedure Laterality Date  . Appendectomy  1962  . Cholecystectomy  1971  . Anterior cruciate ligament repair  1960  . Tubal ligation  1980  . Replacement total knee bilateral  09/06/10  . Joint replacement      both knees 2/12  . Breast lumpectomy  06/11/2011    Procedure: LUMPECTOMY;  Surgeon: Merrie Roof, MD;  Location: [place];  Service: General;  Laterality: Left;  Left Needle localization lumpectomy    FAMILY HISTORY Family History  Problem Relation Age of Onset  . Breast cancer Sister   . Cancer Sister     breast  . 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 Her husband of 63 years Barbra Miner is also retired. Their children are at daughter Vicente Males, 90, a homemaker in [place], Inge Rise 47 a business executive in [place] and Leata Mouse 59 lives in [place]  and is a homemaker. The patient attends [employer].    ADVANCED DIRECTIVES: in place  HEALTH MAINTENANCE: History  Substance Use Topics  . Smoking status: Former Research scientist (life sciences)  . Smokeless tobacco: Not on file     Comment: smoked in college  . Alcohol Use: Yes   Cholesterol under treatment  Bone density normal 2011  Colonoscopy normal 2008  (PAP) 2010    Allergies  Allergen Reactions  . Shellfish Allergy Hives and Swelling  .  Codeine     REACTION: heart races  . Tramadol Nausea And Vomiting    Current Outpatient Prescriptions  Medication Sig Dispense Refill  . ibuprofen (ADVIL,MOTRIN) 200 MG tablet Take 200 mg by mouth every 6 (six) hours as needed.      Marland Kitchen letrozole (FEMARA) 2.5 MG tablet Take 1 tablet (2.5 mg total) by mouth daily.  90 tablet  3  . Levothyroxine Sodium (SYNTHROID PO) Take 88 mcg by mouth daily.       Marland Kitchen lisinopril (PRINIVIL,ZESTRIL) 10 MG tablet Take 10 mg by mouth daily.       . metformin (FORTAMET) 500 MG (OSM) 24 hr tablet Take 500 mg by mouth 2 (two) times daily with a meal.      . non-metallic deodorant (ALRA) MISC Apply 1 application topically daily as needed.      . simvastatin (ZOCOR) 10 MG tablet Take 20 mg by mouth at bedtime.       . Vitamin D, Ergocalciferol, (DRISDOL) 50000 UNITS CAPS Take 50,000 Units by mouth.        . zolpidem (AMBIEN) 5 MG tablet Take 5 mg by mouth at bedtime as needed.        No current facility-administered medications for this visit.    OBJECTIVE: Middle-aged white woman who appears well Filed Vitals:   11/01/13 0956  BP: 145/79  Pulse: 72  Temp: 98.4 F (36.9 C)  Resp: 18     Body mass index is 37.67 kg/(m^2).    ECOG FS: 1  Sclerae unicteric, pupils equal, round and reactive Oropharynx clear and moist No cervical or supraclavicular adenopathy Lungs no rales or rhonchi Heart regular rate and rhythm Abd soft, obese, nontender, positive bowel sounds MSK no focal spinal tenderness, no peripheral edema Neuro: nonfocal, well oriented, appropriate affect Breasts: The right breast is unremarkable; the left breast is status post lumpectomy. The left axillary mass known to be a postoperative seroma or hematoma is unchanged.   LAB RESULTS: Lab Results  Component Value Date   WBC 6.3 05/28/2011   NEUTROABS 3.4 05/28/2011   HGB 12.4 06/27/2011   HCT 38.9 05/28/2011   MCV 90.8 05/28/2011   PLT 228 05/28/2011      Chemistry      Component Value  Date/Time   NA 137 06/25/2011 1400   K 4.9 06/25/2011 1400   CL 102 06/25/2011 1400   CO2 23 06/25/2011 1400   BUN 23 06/25/2011 1400   CREATININE 0.70 06/25/2011 1400      Component Value Date/Time   CALCIUM 10.1 06/25/2011 1400   ALKPHOS 80 05/28/2011 1310   AST 26 05/28/2011 1310   ALT 27 05/28/2011 1310   BILITOT 0.4 05/28/2011 1310       No results found for this basename: LABCA2    No components found with this basename: [id number]    No results found for this basename: INR,  in the last 168 hours  Urinalysis    Component Value Date/Time  COLORURINE YELLOW 12/31/2010 0935   APPEARANCEUR CLOUDY* 12/31/2010 0935   LABSPEC 1.019 12/31/2010 0935   PHURINE 5.0 12/31/2010 0935   GLUCOSEU NEGATIVE 12/31/2010 0935   HGBUR NEGATIVE 12/31/2010 0935   BILIRUBINUR NEGATIVE 12/31/2010 0935   KETONESUR NEGATIVE 12/31/2010 0935   PROTEINUR NEGATIVE 12/31/2010 0935   UROBILINOGEN 0.2 12/31/2010 0935   NITRITE NEGATIVE 12/31/2010 0935   LEUKOCYTESUR SMALL* 12/31/2010 0935    STUDIES: Mammography at [facility] 05/19/2013 showed a breast density to be category be. The seroma said the lumpectomy site have decreased in size. Overall the study was benign.  ASSESSMENT: 73 y.o. [place] woman status post left lumpectomy and sentinel node biopsy November 2012 (with a second procedure for margin clearance) for ductal carcinoma in situ, grade 2, strongly estrogen and progesterone receptor positive, HER-2 negative. Completed radiation March 2013 and started letrozole April 2013   PLAN: Jonita is doing fine as far as her noninvasive breast cancer is concerned and the plan is going to be to continue letrozole 2 April of 2017. She understands letrozole does not cause weight gain, but there is a Gen. tendency to gain weight with menopause. We again reviewed the rather brittle equation for weight change, which is calories in and minus calories out.   Even though she is a "foodie" by her on account, it is still possible  to enjoy a sophisticated meals that are moderate in calories. The real issue is exercise. She simply does not enjoy this. At this point I am concerned that if she started a vigorous exercise program she would likely injure herself. I suggested she try swimming in addition to water aerobics. We also discussed the [employer] program, which would provide her with a free trainer for 3 months, and this might be just enough to get her to a place where she could continue to exercise on her own.  She will discuss these issues further with Dr. Reynaldo Minium at her next visit with him the issue status he is again in one year. She knows to call for any problems that may develop before her next visit here.  Virgie Dad Paije Goodhart    11/06/2013

## 2013-11-18 DIAGNOSIS — E119 Type 2 diabetes mellitus without complications: Secondary | ICD-10-CM | POA: Diagnosis not present

## 2013-11-18 DIAGNOSIS — E039 Hypothyroidism, unspecified: Secondary | ICD-10-CM | POA: Diagnosis not present

## 2013-11-18 DIAGNOSIS — I10 Essential (primary) hypertension: Secondary | ICD-10-CM | POA: Diagnosis not present

## 2013-11-18 DIAGNOSIS — Z6838 Body mass index (BMI) 38.0-38.9, adult: Secondary | ICD-10-CM | POA: Diagnosis not present

## 2013-11-18 DIAGNOSIS — E785 Hyperlipidemia, unspecified: Secondary | ICD-10-CM | POA: Diagnosis not present

## 2013-11-18 DIAGNOSIS — M1990 Unspecified osteoarthritis, unspecified site: Secondary | ICD-10-CM | POA: Diagnosis not present

## 2013-11-22 DIAGNOSIS — H2510 Age-related nuclear cataract, unspecified eye: Secondary | ICD-10-CM | POA: Diagnosis not present

## 2013-11-22 DIAGNOSIS — E119 Type 2 diabetes mellitus without complications: Secondary | ICD-10-CM | POA: Diagnosis not present

## 2013-12-05 ENCOUNTER — Other Ambulatory Visit: Payer: Self-pay | Admitting: Oncology

## 2013-12-05 DIAGNOSIS — C50919 Malignant neoplasm of unspecified site of unspecified female breast: Secondary | ICD-10-CM

## 2014-04-05 ENCOUNTER — Other Ambulatory Visit: Payer: Self-pay | Admitting: Dermatology

## 2014-04-05 DIAGNOSIS — D485 Neoplasm of uncertain behavior of skin: Secondary | ICD-10-CM | POA: Diagnosis not present

## 2014-04-05 DIAGNOSIS — Z419 Encounter for procedure for purposes other than remedying health state, unspecified: Secondary | ICD-10-CM | POA: Diagnosis not present

## 2014-04-05 DIAGNOSIS — L918 Other hypertrophic disorders of the skin: Secondary | ICD-10-CM | POA: Diagnosis not present

## 2014-04-05 DIAGNOSIS — L821 Other seborrheic keratosis: Secondary | ICD-10-CM | POA: Diagnosis not present

## 2014-04-05 DIAGNOSIS — D2370 Other benign neoplasm of skin of unspecified lower limb, including hip: Secondary | ICD-10-CM | POA: Diagnosis not present

## 2014-04-05 DIAGNOSIS — L908 Other atrophic disorders of skin: Secondary | ICD-10-CM | POA: Diagnosis not present

## 2014-05-03 DIAGNOSIS — Z23 Encounter for immunization: Secondary | ICD-10-CM | POA: Diagnosis not present

## 2014-05-08 DIAGNOSIS — L820 Inflamed seborrheic keratosis: Secondary | ICD-10-CM | POA: Diagnosis not present

## 2014-05-11 DIAGNOSIS — M65332 Trigger finger, left middle finger: Secondary | ICD-10-CM | POA: Diagnosis not present

## 2014-05-11 DIAGNOSIS — M659 Synovitis and tenosynovitis, unspecified: Secondary | ICD-10-CM | POA: Diagnosis not present

## 2014-05-11 DIAGNOSIS — M65342 Trigger finger, left ring finger: Secondary | ICD-10-CM | POA: Diagnosis not present

## 2014-05-23 DIAGNOSIS — Z853 Personal history of malignant neoplasm of breast: Secondary | ICD-10-CM | POA: Diagnosis not present

## 2014-06-01 DIAGNOSIS — M65331 Trigger finger, right middle finger: Secondary | ICD-10-CM | POA: Diagnosis not present

## 2014-06-01 DIAGNOSIS — M659 Synovitis and tenosynovitis, unspecified: Secondary | ICD-10-CM | POA: Diagnosis not present

## 2014-06-01 DIAGNOSIS — M79644 Pain in right finger(s): Secondary | ICD-10-CM | POA: Diagnosis not present

## 2014-06-21 DIAGNOSIS — E039 Hypothyroidism, unspecified: Secondary | ICD-10-CM | POA: Diagnosis not present

## 2014-06-21 DIAGNOSIS — Z0000 Encounter for general adult medical examination without abnormal findings: Secondary | ICD-10-CM | POA: Diagnosis not present

## 2014-06-21 DIAGNOSIS — Z1212 Encounter for screening for malignant neoplasm of rectum: Secondary | ICD-10-CM | POA: Diagnosis not present

## 2014-06-21 DIAGNOSIS — E119 Type 2 diabetes mellitus without complications: Secondary | ICD-10-CM | POA: Diagnosis not present

## 2014-06-21 DIAGNOSIS — E559 Vitamin D deficiency, unspecified: Secondary | ICD-10-CM | POA: Diagnosis not present

## 2014-06-21 DIAGNOSIS — I10 Essential (primary) hypertension: Secondary | ICD-10-CM | POA: Diagnosis not present

## 2014-06-23 DIAGNOSIS — E669 Obesity, unspecified: Secondary | ICD-10-CM | POA: Diagnosis not present

## 2014-06-23 DIAGNOSIS — E785 Hyperlipidemia, unspecified: Secondary | ICD-10-CM | POA: Diagnosis not present

## 2014-06-23 DIAGNOSIS — E559 Vitamin D deficiency, unspecified: Secondary | ICD-10-CM | POA: Diagnosis not present

## 2014-06-23 DIAGNOSIS — I10 Essential (primary) hypertension: Secondary | ICD-10-CM | POA: Diagnosis not present

## 2014-06-23 DIAGNOSIS — Z0000 Encounter for general adult medical examination without abnormal findings: Secondary | ICD-10-CM | POA: Diagnosis not present

## 2014-06-23 DIAGNOSIS — M1990 Unspecified osteoarthritis, unspecified site: Secondary | ICD-10-CM | POA: Diagnosis not present

## 2014-06-23 DIAGNOSIS — E119 Type 2 diabetes mellitus without complications: Secondary | ICD-10-CM | POA: Diagnosis not present

## 2014-06-23 DIAGNOSIS — Z1212 Encounter for screening for malignant neoplasm of rectum: Secondary | ICD-10-CM | POA: Diagnosis not present

## 2014-06-23 DIAGNOSIS — E039 Hypothyroidism, unspecified: Secondary | ICD-10-CM | POA: Diagnosis not present

## 2014-07-24 ENCOUNTER — Other Ambulatory Visit: Payer: Self-pay | Admitting: Internal Medicine

## 2014-07-24 DIAGNOSIS — R1084 Generalized abdominal pain: Secondary | ICD-10-CM

## 2014-07-24 DIAGNOSIS — R197 Diarrhea, unspecified: Secondary | ICD-10-CM

## 2014-07-24 DIAGNOSIS — R1032 Left lower quadrant pain: Secondary | ICD-10-CM | POA: Diagnosis not present

## 2014-07-24 DIAGNOSIS — Z6838 Body mass index (BMI) 38.0-38.9, adult: Secondary | ICD-10-CM | POA: Diagnosis not present

## 2014-07-26 ENCOUNTER — Ambulatory Visit
Admission: RE | Admit: 2014-07-26 | Discharge: 2014-07-26 | Disposition: A | Discharge status: Home / Self Care | Payer: Medicare Other | Source: Ambulatory Visit | Attending: Internal Medicine | Admitting: Internal Medicine

## 2014-07-26 DIAGNOSIS — R197 Diarrhea, unspecified: Secondary | ICD-10-CM | POA: Diagnosis not present

## 2014-07-26 DIAGNOSIS — K760 Fatty (change of) liver, not elsewhere classified: Secondary | ICD-10-CM | POA: Diagnosis not present

## 2014-07-26 DIAGNOSIS — R1084 Generalized abdominal pain: Secondary | ICD-10-CM

## 2014-07-26 MED ORDER — IOHEXOL 300 MG/ML  SOLN
125.0000 mL | Freq: Once | INTRAMUSCULAR | Status: AC | PRN
  Administered 2014-07-26: 125 mL via INTRAVENOUS

## 2014-09-06 DIAGNOSIS — L814 Other melanin hyperpigmentation: Secondary | ICD-10-CM | POA: Diagnosis not present

## 2014-09-06 DIAGNOSIS — L821 Other seborrheic keratosis: Secondary | ICD-10-CM | POA: Diagnosis not present

## 2014-09-06 DIAGNOSIS — L820 Inflamed seborrheic keratosis: Secondary | ICD-10-CM | POA: Diagnosis not present

## 2014-09-06 DIAGNOSIS — D2272 Melanocytic nevi of left lower limb, including hip: Secondary | ICD-10-CM | POA: Diagnosis not present

## 2014-09-06 DIAGNOSIS — Z419 Encounter for procedure for purposes other than remedying health state, unspecified: Secondary | ICD-10-CM | POA: Diagnosis not present

## 2014-10-23 DIAGNOSIS — L570 Actinic keratosis: Secondary | ICD-10-CM | POA: Diagnosis not present

## 2014-10-23 DIAGNOSIS — D2262 Melanocytic nevi of left upper limb, including shoulder: Secondary | ICD-10-CM | POA: Diagnosis not present

## 2014-10-23 DIAGNOSIS — L814 Other melanin hyperpigmentation: Secondary | ICD-10-CM | POA: Diagnosis not present

## 2014-10-23 DIAGNOSIS — D2272 Melanocytic nevi of left lower limb, including hip: Secondary | ICD-10-CM | POA: Diagnosis not present

## 2014-10-23 DIAGNOSIS — D225 Melanocytic nevi of trunk: Secondary | ICD-10-CM | POA: Diagnosis not present

## 2014-10-23 DIAGNOSIS — L919 Hypertrophic disorder of the skin, unspecified: Secondary | ICD-10-CM | POA: Diagnosis not present

## 2014-10-23 DIAGNOSIS — D1801 Hemangioma of skin and subcutaneous tissue: Secondary | ICD-10-CM | POA: Diagnosis not present

## 2014-10-23 DIAGNOSIS — L821 Other seborrheic keratosis: Secondary | ICD-10-CM | POA: Diagnosis not present

## 2014-10-23 DIAGNOSIS — I788 Other diseases of capillaries: Secondary | ICD-10-CM | POA: Diagnosis not present

## 2014-10-24 ENCOUNTER — Other Ambulatory Visit: Payer: Self-pay | Admitting: *Deleted

## 2014-10-24 DIAGNOSIS — Z01419 Encounter for gynecological examination (general) (routine) without abnormal findings: Secondary | ICD-10-CM | POA: Diagnosis not present

## 2014-10-24 DIAGNOSIS — Z124 Encounter for screening for malignant neoplasm of cervix: Secondary | ICD-10-CM | POA: Diagnosis not present

## 2014-10-24 DIAGNOSIS — C50919 Malignant neoplasm of unspecified site of unspecified female breast: Secondary | ICD-10-CM

## 2014-10-25 ENCOUNTER — Other Ambulatory Visit (HOSPITAL_BASED_OUTPATIENT_CLINIC_OR_DEPARTMENT_OTHER): Payer: Medicare Other

## 2014-10-25 DIAGNOSIS — C50919 Malignant neoplasm of unspecified site of unspecified female breast: Secondary | ICD-10-CM

## 2014-10-25 DIAGNOSIS — D0512 Intraductal carcinoma in situ of left breast: Secondary | ICD-10-CM

## 2014-10-25 LAB — COMPREHENSIVE METABOLIC PANEL (CC13)
ALBUMIN: 4.1 g/dL (ref 3.5–5.0)
ALT: 58 U/L — ABNORMAL HIGH (ref 0–55)
AST: 44 U/L — AB (ref 5–34)
Alkaline Phosphatase: 69 U/L (ref 40–150)
Anion Gap: 12 mEq/L — ABNORMAL HIGH (ref 3–11)
BUN: 17.8 mg/dL (ref 7.0–26.0)
CALCIUM: 9.5 mg/dL (ref 8.4–10.4)
CHLORIDE: 105 meq/L (ref 98–109)
CO2: 24 mEq/L (ref 22–29)
Creatinine: 1 mg/dL (ref 0.6–1.1)
EGFR: 57 mL/min/{1.73_m2} — ABNORMAL LOW (ref >90)
Glucose: 181 mg/dl — ABNORMAL HIGH (ref 70–140)
POTASSIUM: 4.4 meq/L (ref 3.5–5.1)
Sodium: 142 mEq/L (ref 136–145)
TOTAL PROTEIN: 7 g/dL (ref 6.4–8.3)
Total Bilirubin: 0.42 mg/dL (ref 0.20–1.20)

## 2014-10-25 LAB — CBC WITH DIFFERENTIAL/PLATELET
BASO%: 0.2 % (ref 0.0–2.0)
BASOS ABS: 0 10*3/uL (ref 0.0–0.1)
EOS%: 3.3 % (ref 0.0–7.0)
Eosinophils Absolute: 0.1 10*3/uL (ref 0.0–0.5)
HCT: 38.9 % (ref 34.8–46.6)
HEMOGLOBIN: 13 g/dL (ref 11.6–15.9)
LYMPH#: 1.3 10*3/uL (ref 0.9–3.3)
LYMPH%: 30.8 % (ref 14.0–49.7)
MCH: 30.5 pg (ref 25.1–34.0)
MCHC: 33.4 g/dL (ref 31.5–36.0)
MCV: 91.3 fL (ref 79.5–101.0)
MONO#: 0.5 10*3/uL (ref 0.1–0.9)
MONO%: 11 % (ref 0.0–14.0)
NEUT#: 2.3 10*3/uL (ref 1.5–6.5)
NEUT%: 54.7 % (ref 38.4–76.8)
Platelets: 152 10*3/uL (ref 145–400)
RBC: 4.26 10*6/uL (ref 3.70–5.45)
RDW: 13.7 % (ref 11.2–14.5)
WBC: 4.2 10*3/uL (ref 3.9–10.3)

## 2014-10-31 ENCOUNTER — Ambulatory Visit (HOSPITAL_BASED_OUTPATIENT_CLINIC_OR_DEPARTMENT_OTHER): Payer: Medicare Other | Admitting: Oncology

## 2014-10-31 ENCOUNTER — Telehealth: Payer: Self-pay | Admitting: Oncology

## 2014-10-31 VITALS — BP 132/66 | HR 70 | Temp 97.8°F | Resp 18 | Ht 65.0 in | Wt 224.3 lb

## 2014-10-31 DIAGNOSIS — D0512 Intraductal carcinoma in situ of left breast: Secondary | ICD-10-CM

## 2014-10-31 DIAGNOSIS — Z6837 Body mass index (BMI) 37.0-37.9, adult: Secondary | ICD-10-CM

## 2014-10-31 DIAGNOSIS — C50919 Malignant neoplasm of unspecified site of unspecified female breast: Secondary | ICD-10-CM

## 2014-10-31 DIAGNOSIS — R159 Full incontinence of feces: Secondary | ICD-10-CM

## 2014-10-31 DIAGNOSIS — K760 Fatty (change of) liver, not elsewhere classified: Secondary | ICD-10-CM

## 2014-10-31 DIAGNOSIS — R197 Diarrhea, unspecified: Secondary | ICD-10-CM

## 2014-10-31 MED ORDER — LETROZOLE 2.5 MG PO TABS: 2.5000 mg | ORAL_TABLET | Freq: Every day | ORAL | Status: DC

## 2014-10-31 NOTE — Telephone Encounter (Signed)
Appointments made and avs printed for patient °

## 2014-10-31 NOTE — Progress Notes (Signed)
ID: Rickey Primus   DOB: 04-Jan-1941  MR#: [id number]  QMG#:[id number]  PCP: Geoffery Lyons, MD GYN: SU:  OTHER MD:  CHIEF COMPLAINT: Ductal carcinoma in situ  CURRENT TREATMENT: Letrozole  HISTORY OF PRESENT ILLNESS: From the original intake note:  Alysiana underwent routine screening mammography at [facility] 05/13/2011: new linear microcalcifications were noted in the posterior mid left breast and she was brought back for additional imaging October 25. This showed a 2 cm cluster of pleomorphic calcifications with associated mass or architectural distortion. Biopsy was performed October 30 and showed ([id number]) ductal carcinoma in situ, grade 3, strongly estrogen receptor positive at 99%, also progesterone receptor positive at 72%.   Bilateral breast MRIs were obtained at [facility] imaging 05/27/2011. This showed an area of non-masslike clumped linear enhancement centrally and posteriorly in the left breast measuring 3.9 cm.  There were no other enhancing lesions and no findings in the right breast of any concern.  There was no evidence of adenopathy. In November 2012 she underwent left lumpectomy and sentinel lymph node sampling, followed by a second surgery for margin clearance 06/27/2011.   Her subsequent history is as noted below.  INTERVAL HISTORY: Julionna returns today with her husband for followup of her breast cancer. She continues on letrozole. Hot flashes are not an issue. She is not aware vaginal dryness problems. She has not experienced the arthralgias or myalgias that many patients can have with this drug. She obtains it at a good price.  REVIEW OF SYSTEMS: She continues to fight the weight. She has gone on a Nutrisystem diet. She is not yet started an exercise program. Most of the activities she enjoys such as painting, playing bridge, and reading, our sedentary. Her worse problem is frequent on anticipated bowel movements. They attend to happen after meals. Its worse if she  has any kind of caffeine. The problem is not the urgency, but the fact that sometimes there is "seeppage". Aside from this issues she still has pain in the surgical breast. She tells me her blood sugars are well-controlled. A detailed review of systems today was otherwise stable  PAST MEDICAL HISTORY: Past Medical History  Diagnosis Date  . GERD (gastroesophageal reflux disease)   . Hypertension   . Hyperlipidemia   . Hypothyroid   . Osteoarthritis   . Cancer     lt breast  . Diabetes mellitus     pre-diabetic    PAST SURGICAL HISTORY: Past Surgical History  Procedure Laterality Date  . Appendectomy  1962  . Cholecystectomy  1971  . Anterior cruciate ligament repair  1960  . Tubal ligation  1980  . Replacement total knee bilateral  09/06/10  . Joint replacement      both knees 2/12  . Breast lumpectomy  06/11/2011    Procedure: LUMPECTOMY;  Surgeon: Merrie Roof, MD;  Location: [facility];  Service: General;  Laterality: Left;  Left Needle localization lumpectomy    FAMILY HISTORY Family History  Problem Relation Age of Onset  . Breast cancer Sister   . Cancer Sister     breast  . 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 Her husband of 40 years Sparkle Aube is also retired. Their children are at daughter Vicente Males, 66, a homemaker in [place], Inge Rise 47 a business executive in [place] and Leata Mouse 66 lives in [place] and is a homemaker. The patient attends [employer].    ADVANCED DIRECTIVES: in place  HEALTH  MAINTENANCE: History  Substance Use Topics  . Smoking status: Former Research scientist (life sciences)  . Smokeless tobacco: Not on file     Comment: smoked in college  . Alcohol Use: Yes   Cholesterol under treatment  Bone density October 2015 normal per patient report Colonoscopy normal 2008  (PAP) 2010    Allergies  Allergen Reactions  . Shellfish Allergy Hives and Swelling  . Codeine     REACTION: heart races  . Tramadol Nausea And Vomiting    Current Outpatient Prescriptions  Medication Sig Dispense Refill  . ibuprofen (ADVIL,MOTRIN) 200 MG tablet Take 200 mg by mouth every 6 (six) hours as needed.    Marland Kitchen letrozole (FEMARA) 2.5 MG tablet Take 1 tablet (2.5 mg total) by mouth daily. 90 tablet 3  . Levothyroxine Sodium (SYNTHROID PO) Take 88 mcg by mouth daily.     Marland Kitchen lisinopril (PRINIVIL,ZESTRIL) 10 MG tablet Take 10 mg by mouth daily.     . metformin (FORTAMET) 500 MG (OSM) 24 hr tablet Take 500 mg by mouth 2 (two) times daily with a meal.    . non-metallic deodorant (ALRA) MISC Apply 1 application topically daily as needed.    . simvastatin (ZOCOR) 10 MG tablet Take 20 mg by mouth at bedtime.     . Vitamin D, Ergocalciferol, (DRISDOL) 50000 UNITS CAPS Take 50,000 Units by mouth.      . zolpidem (AMBIEN) 5 MG tablet Take 5 mg by mouth at bedtime as needed.      No current facility-administered medications for this visit.    OBJECTIVE: Middle-aged white woman in no acute distress Filed Vitals:   10/31/14 1325  BP: 132/66  Pulse: 70  Temp: 97.8 F (36.6 C)  Resp: 18     Body mass index is 37.33 kg/(m^2).    ECOG FS: 1  Sclerae unicteric, pupils round and equal Oropharynx clear, dentition in good repair No cervical or supraclavicular adenopathy Lungs no rales or rhonchi Heart regular rate and rhythm Abd soft, obese, nontender, positive bowel sounds MSK no focal spinal tenderness Neuro: nonfocal, well oriented, positive affect Breasts: The right breast is unremarkable. The left breast is  status post lumpectomy and radiation. There is no evidence of local recurrence. The left axilla is benign.  LAB RESULTS: Lab Results  Component Value Date   WBC 4.2 10/25/2014   NEUTROABS 2.3 10/25/2014   HGB 13.0 10/25/2014   HCT 38.9 10/25/2014   MCV 91.3 10/25/2014   PLT 152 10/25/2014      Chemistry      Component Value Date/Time   NA 142 10/25/2014 0819   NA 137 06/25/2011 1400   K 4.4 10/25/2014 0819   K 4.9 06/25/2011 1400   CL 102 06/25/2011 1400   CO2 24 10/25/2014 0819   CO2 23 06/25/2011 1400   BUN 17.8 10/25/2014 0819   BUN 23 06/25/2011 1400   CREATININE 1.0 10/25/2014 0819   CREATININE 0.70 06/25/2011 1400      Component Value Date/Time   CALCIUM 9.5 10/25/2014 0819   CALCIUM 10.1  06/25/2011 1400   ALKPHOS 69 10/25/2014 0819   ALKPHOS 80 05/28/2011 1310   AST 44* 10/25/2014 0819   AST 26 05/28/2011 1310   ALT 58* 10/25/2014 0819   ALT 27 05/28/2011 1310   BILITOT 0.42 10/25/2014 0819   BILITOT 0.4 05/28/2011 1310       No results found for: LABCA2  No components found for: LABCA125  No results for input(s): INR in the last 168 hours.  Urinalysis    Component Value Date/Time   COLORURINE YELLOW 12/31/2010 0935   APPEARANCEUR CLOUDY* 12/31/2010 0935   LABSPEC 1.019 12/31/2010 0935   PHURINE 5.0 12/31/2010 0935   GLUCOSEU NEGATIVE 12/31/2010 0935   HGBUR NEGATIVE 12/31/2010 0935   BILIRUBINUR NEGATIVE 12/31/2010 0935   KETONESUR NEGATIVE 12/31/2010 0935   PROTEINUR NEGATIVE 12/31/2010 0935   UROBILINOGEN 0.2 12/31/2010 0935   NITRITE NEGATIVE 12/31/2010 0935   LEUKOCYTESUR SMALL* 12/31/2010 0935    STUDIES:  CLINICAL DATA: Left lower quadrant pain, diarrhea for 3 weeks, completed antibiotic treatment 2 days ago, history of left breast carcinoma diagnosed in 2012 with lumpectomy and radiation treatment.  EXAM: CT ABDOMEN AND PELVIS WITH CONTRAST  TECHNIQUE: Multidetector CT imaging of the abdomen and pelvis was performed using  the standard protocol following bolus administration of intravenous contrast.  CONTRAST: 131m OMNIPAQUE IOHEXOL 300 MG/ML SOLN  COMPARISON: CT abdomen pelvis of 01/31/2008 and CT chest abdomen pelvis of 06/05/2006  FINDINGS: On images through the lung bases there is an 8 mm noncalcified nodule anteriorly within the lingula. Compared to the CT of 2007, this lingular nodule was present and has not changed in size in the interval. No pleural effusion is seen.  Soft tissue is noted deep in the left inferior breast which may be related to scarring from prior lumpectomy and radiation therapy but clinical correlation is recommended. This area measures 2.2 cm in diameter. The liver enhances with no focal abnormality, and probable fatty infiltration diffusely is unchanged. The left lobe is somewhat prominent. A calcification is noted high in the right lobe laterally most consistent with granuloma. The gallbladder appears to have been resected. The common bile duct is prominent but stable most likely normal post cholecystectomy. Correlate with LFTs. The pancreas is normal in size and the pancreatic duct is not dilated. The adrenal glands and spleen are unremarkable. The stomach is not well distended but no gross abnormality is evident. The kidneys enhance with no calculus. A large cyst emanates from the lower pole of the left kidney which is unchanged with a small cyst emanating from the upper pole of the right kidney anteriorly measuring 1.7 cm on coronal images with an attenuation of less than 10 HU. The abdominal aorta is normal in caliber. No adenopathy is seen.  The urinary bladder is not well distended. A small umbilical hernia is noted containing only fat. The uterus is normal in size and no adnexal lesion is seen. No fluid is noted within the pelvis. The colon is not distended. No acute abnormality is seen. Only a few scattered diverticula are present throughout the colon  but no evidence of diverticulitis is seen. The terminal ileum is unremarkable. The appendix has by history been resected previously. No bowel wall edema is noted and the small bowel is normal in caliber. The lumbar vertebrae are in normal alignment with normal disc spaces. There are degenerative changes throughout the facet joints of the lower lumbar spine.  IMPRESSION: 1. No explanation for the patient's abdominal pain and diarrhea  is seen. Stable 8 mm noncalcified nodule in the lingula. 2. There are scattered colonic diverticula but no diverticulitis is noted. 3. Soft tissue lesion deep in the left inferior breast may be related to prior surgery and scarring, but clinical correlation is recommended. 4. Fatty infiltration of the liver as previously identified.   Electronically Signed  By: Ivar Drape M.D.  On: 07/26/2014 12:24      ASSESSMENT: 74 y.o. [place] woman status post left lumpectomy and sentinel node biopsy November 2012 (with a second procedure for margin clearance) for ductal carcinoma in situ, grade 2, strongly estrogen and progesterone receptor positive, HER-2 negative. Completed radiation March 2013 and started letrozole April 2013   PLAN:  Atina is very concerned about the diarrhea/stool incontinence problem. Of course she is on metformin, but not on a high dose. I suggested she go off caffeine for an entire month and see that solves a problem. Otherwise she can consider a GI consultation and she can discuss that with Dr Reynaldo Minium at their next visit.  As far as her weight is concerned she can use [place] gym [place] [employer], but she doesn't. I gave her a Counsellor. I think she would enjoy that group, which comes with its own trainer, and if she is able to do some of the program in addition to her new diet she might make some headway with a weight problem  From a breast cancer point of view she is doing just fine. She will see [place] again in one year  and then for a final year visit 2 years from now.Marland Kitchen  Nimrod Wendt C    10/31/2014

## 2014-11-01 DIAGNOSIS — Z6838 Body mass index (BMI) 38.0-38.9, adult: Secondary | ICD-10-CM | POA: Diagnosis not present

## 2014-11-01 DIAGNOSIS — M1990 Unspecified osteoarthritis, unspecified site: Secondary | ICD-10-CM | POA: Diagnosis not present

## 2014-11-01 DIAGNOSIS — E119 Type 2 diabetes mellitus without complications: Secondary | ICD-10-CM | POA: Diagnosis not present

## 2014-11-01 DIAGNOSIS — E039 Hypothyroidism, unspecified: Secondary | ICD-10-CM | POA: Diagnosis not present

## 2014-11-01 DIAGNOSIS — E785 Hyperlipidemia, unspecified: Secondary | ICD-10-CM | POA: Diagnosis not present

## 2014-11-01 DIAGNOSIS — E669 Obesity, unspecified: Secondary | ICD-10-CM | POA: Diagnosis not present

## 2014-11-01 DIAGNOSIS — I10 Essential (primary) hypertension: Secondary | ICD-10-CM | POA: Diagnosis not present

## 2014-11-01 DIAGNOSIS — Z1389 Encounter for screening for other disorder: Secondary | ICD-10-CM | POA: Diagnosis not present

## 2014-11-09 DIAGNOSIS — M65331 Trigger finger, right middle finger: Secondary | ICD-10-CM | POA: Diagnosis not present

## 2014-11-09 DIAGNOSIS — M65342 Trigger finger, left ring finger: Secondary | ICD-10-CM | POA: Diagnosis not present

## 2014-11-09 DIAGNOSIS — M65332 Trigger finger, left middle finger: Secondary | ICD-10-CM | POA: Diagnosis not present

## 2014-11-27 ENCOUNTER — Emergency Department (HOSPITAL_COMMUNITY)
Admission: EM | Admit: 2014-11-27 | Discharge: 2014-11-27 | Disposition: A | Discharge status: Home / Self Care | Payer: Medicare Other | Attending: Emergency Medicine | Admitting: Emergency Medicine

## 2014-11-27 ENCOUNTER — Encounter (HOSPITAL_COMMUNITY): Payer: Self-pay | Admitting: Emergency Medicine

## 2014-11-27 ENCOUNTER — Emergency Department (HOSPITAL_COMMUNITY): Payer: Medicare Other

## 2014-11-27 DIAGNOSIS — S90112A Contusion of left great toe without damage to nail, initial encounter: Secondary | ICD-10-CM | POA: Diagnosis not present

## 2014-11-27 DIAGNOSIS — M79672 Pain in left foot: Secondary | ICD-10-CM | POA: Diagnosis not present

## 2014-11-27 DIAGNOSIS — Z87891 Personal history of nicotine dependence: Secondary | ICD-10-CM | POA: Diagnosis not present

## 2014-11-27 DIAGNOSIS — E785 Hyperlipidemia, unspecified: Secondary | ICD-10-CM | POA: Insufficient documentation

## 2014-11-27 DIAGNOSIS — Y998 Other external cause status: Secondary | ICD-10-CM | POA: Insufficient documentation

## 2014-11-27 DIAGNOSIS — W208XXA Other cause of strike by thrown, projected or falling object, initial encounter: Secondary | ICD-10-CM | POA: Diagnosis not present

## 2014-11-27 DIAGNOSIS — S8992XA Unspecified injury of left lower leg, initial encounter: Secondary | ICD-10-CM | POA: Insufficient documentation

## 2014-11-27 DIAGNOSIS — E039 Hypothyroidism, unspecified: Secondary | ICD-10-CM | POA: Insufficient documentation

## 2014-11-27 DIAGNOSIS — Z79899 Other long term (current) drug therapy: Secondary | ICD-10-CM | POA: Diagnosis not present

## 2014-11-27 DIAGNOSIS — M1990 Unspecified osteoarthritis, unspecified site: Secondary | ICD-10-CM | POA: Insufficient documentation

## 2014-11-27 DIAGNOSIS — Y9389 Activity, other specified: Secondary | ICD-10-CM | POA: Insufficient documentation

## 2014-11-27 DIAGNOSIS — I10 Essential (primary) hypertension: Secondary | ICD-10-CM | POA: Insufficient documentation

## 2014-11-27 DIAGNOSIS — E119 Type 2 diabetes mellitus without complications: Secondary | ICD-10-CM | POA: Diagnosis not present

## 2014-11-27 DIAGNOSIS — Z8719 Personal history of other diseases of the digestive system: Secondary | ICD-10-CM | POA: Diagnosis not present

## 2014-11-27 DIAGNOSIS — Y92513 Shop (commercial) as the place of occurrence of the external cause: Secondary | ICD-10-CM | POA: Insufficient documentation

## 2014-11-27 DIAGNOSIS — S9032XA Contusion of left foot, initial encounter: Secondary | ICD-10-CM | POA: Insufficient documentation

## 2014-11-27 DIAGNOSIS — M25562 Pain in left knee: Secondary | ICD-10-CM | POA: Diagnosis not present

## 2014-11-27 DIAGNOSIS — S99922A Unspecified injury of left foot, initial encounter: Secondary | ICD-10-CM | POA: Diagnosis not present

## 2014-11-27 DIAGNOSIS — M25572 Pain in left ankle and joints of left foot: Secondary | ICD-10-CM | POA: Diagnosis not present

## 2014-11-27 NOTE — ED Provider Notes (Signed)
CSN: [id number]     Arrival date & time 11/27/14  1049 History  This chart was scribed for non-physician practitioner Hyman Bible, PA-C working with Charlesetta Shanks, MD by Hilda Lias, ED Scribe. This patient was seen in room WTR5/WTR5 and the patient's care was started at 12:46 PM.    Chief Complaint  Patient presents with  . Knee Injury  . Foot Injury      The history is provided by the patient. No language interpreter was used.     HPI Comments: Dawn Bradley is a 74 y.o. female who presents to the Emergency Department complaining of a left foot injury that occurred about 2.5 hours PTA. Pt states that she was shopping when a 30-40 pound object fell off of a display case and landed directly on her foot. Pt states that she is able to ambulate with pain. Pt states she has taken two advil since the injury occurred with no relief. Pt denies any head trauma or LOC.   Denies numbness or tingling.      Past Medical History  Diagnosis Date  . GERD (gastroesophageal reflux disease)   . Hypertension   . Hyperlipidemia   . Hypothyroid   . Osteoarthritis   . Cancer     lt breast  . Diabetes mellitus     pre-diabetic   Past Surgical History  Procedure Laterality Date  . Appendectomy  1962  . Cholecystectomy  1971  . Anterior cruciate ligament repair  1960  . Tubal ligation  1980  . Replacement total knee bilateral  09/06/10  . Joint replacement      both knees 2/12  . Breast lumpectomy  06/11/2011    Procedure: LUMPECTOMY;  Surgeon: Merrie Roof, MD;  Location: [facility];  Service: General;  Laterality: Left;  Left Needle localization lumpectomy   Family History  Problem Relation Age of Onset  . Breast cancer Sister   . Cancer Sister     breast  . Cancer Father     kidney   History  Substance Use Topics  . Smoking status: Former Research scientist (life sciences)  . Smokeless tobacco: Not on file     Comment: smoked in college  . Alcohol Use: Yes   OB History    No data  available     Review of Systems  Musculoskeletal: Positive for arthralgias.  Skin: Positive for color change.  Neurological: Negative for numbness.      Allergies  Shellfish allergy; Codeine; and Tramadol  Home Medications   Prior to Admission medications   Medication Sig Start Date End Date Taking? Authorizing Provider  glipiZIDE (GLUCOTROL) 10 MG tablet Take 0.5 tablets (5 mg total) by mouth daily before breakfast. 10/31/14   Chauncey Cruel, MD  ibuprofen (ADVIL,MOTRIN) 200 MG tablet Take 200 mg by mouth every 6 (six) hours as needed.    Historical Provider, MD  letrozole (FEMARA) 2.5 MG tablet Take 1 tablet (2.5 mg total) by mouth daily. 10/31/14   Chauncey Cruel, MD  Levothyroxine Sodium (SYNTHROID PO) Take 88 mcg by mouth daily.     Historical Provider, MD  lisinopril (PRINIVIL,ZESTRIL) 10 MG tablet Take 10 mg by mouth daily.     Historical Provider, MD  metformin (FORTAMET) 500 MG (OSM) 24 hr tablet Take 500 mg by mouth 2 (two) times daily with a meal.    Historical Provider, MD  non-metallic deodorant Jethro Poling) MISC Apply 1 application topically daily as needed.    Historical Provider,  MD  simvastatin (ZOCOR) 10 MG tablet Take 20 mg by mouth at bedtime.     Historical Provider, MD  Vitamin D, Ergocalciferol, (DRISDOL) 50000 UNITS CAPS Take 50,000 Units by mouth.      Historical Provider, MD  zolpidem (AMBIEN) 5 MG tablet Take 5 mg by mouth at bedtime as needed.     Historical Provider, MD   BP 128/62 mmHg  Pulse 53  Temp(Src) 97.6 F (36.4 C) (Oral)  SpO2 100% Physical Exam  Constitutional: She appears well-developed and well-nourished.  HENT:  Head: Normocephalic and atraumatic.  Neck: Normal range of motion. Neck supple.  Cardiovascular: Normal rate, regular rhythm and normal heart sounds.   Pulmonary/Chest: Effort normal and breath sounds normal.  Musculoskeletal:       Left ankle: She exhibits normal range of motion and no swelling. No tenderness.  bruising to  dorsal aspect of Left 2nd toe bruising over dorsal aspect of foot  Distal sensation of left foot intact No tenderness of left knee Full ROM of left knee  Neurological: She is alert. No sensory deficit.  Skin: Skin is warm and dry.  Psychiatric: She has a normal mood and affect.  Nursing note and vitals reviewed.   ED Course  Procedures (including critical care time)  DIAGNOSTIC STUDIES: Oxygen Saturation is 100% on RA, normal by my interpretation.    COORDINATION OF CARE: 12:50 PM Discussed treatment plan with pt at bedside and pt agreed to plan.   Labs Review Labs Reviewed - No data to display  Imaging Review Dg Ankle Complete Left  11/27/2014   CLINICAL DATA:  Trauma to the left lower extremity with pain  EXAM: LEFT ANKLE COMPLETE - 3+ VIEW  COMPARISON:  None.  FINDINGS: Deformity from remote mid to distal left tibial and fibular fractures is identified. No acute fracture line is seen. Mortise view is mildly suboptimally positioned but the mortise is grossly intact. No soft tissue abnormality. No radiopaque foreign body. Plantar calcaneal spurring is identified.  IMPRESSION: No acute osseous abnormality of the left ankle.   Electronically Signed   By: Conchita Paris M.D.   On: 11/27/2014 12:30   Dg Knee Complete 4 Views Left  11/27/2014   CLINICAL DATA:  Fall against the left knee.  Initial encounter.  EXAM: LEFT KNEE - COMPLETE 4+ VIEW  COMPARISON:  None.  FINDINGS: Total knee arthroplasty is well seated. No acute fracture or malalignment. There is mature non bridging ossification around the medial more than lateral joint.  IMPRESSION: 1. No acute finding. 2. A total knee arthroplasty is well seated.   Electronically Signed   By: Monte Fantasia M.D.   On: 11/27/2014 12:30   Dg Foot Complete Left  11/27/2014   CLINICAL DATA:  Display shelf fell onto foot.  Pain  EXAM: LEFT FOOT - COMPLETE 3+ VIEW  COMPARISON:  None.  FINDINGS: Frontal, oblique, and lateral views obtained. There is  evidence of an old healed fracture of the distal fibula with remodeling. There is no acute fracture or dislocation. There is ankylosis of the second PIP joint. There is moderate narrowing of the first MTP joint as well as all PIP and DIP joints. There is mild spurring in the dorsal midfoot. There is a spur arising from the inferior calcaneus. Bones are osteoporotic.  IMPRESSION: No acute fracture or dislocation. Old healed fracture distal fibula with remodeling. Multifocal osteoarthritic change. Note that there is ankylosis at the second PIP joint. Bones are osteoporotic.   Electronically  Signed   By: Lowella Grip III M.D.   On: 11/27/2014 12:31     EKG Interpretation None      MDM   Final diagnoses:  None   Patient presents today with foot pain after a display case fell on her foot earlier today.  Xrays ordered by Triage RN.  Xrays of foot, ankle, and knee are all negative.  She is neurovascularly intact.  Able to ambulate.  Return precautions given.     Hyman Bible, PA-C 11/28/14 1213  Charlesetta Shanks, MD 11/29/14 [phone]

## 2014-11-27 NOTE — ED Notes (Signed)
Pt was at a plumbing store and a 30-40 pound object fell on her left foot and against her left knee. C/o left knee and foot pain. Swelling and bruising noted to left foot. Has had bilateral knee replacements. Has full ROM with left leg but is walking with a limp and c/o left foot numbness.

## 2014-12-07 DIAGNOSIS — S9032XA Contusion of left foot, initial encounter: Secondary | ICD-10-CM | POA: Diagnosis not present

## 2014-12-12 DIAGNOSIS — E119 Type 2 diabetes mellitus without complications: Secondary | ICD-10-CM | POA: Diagnosis not present

## 2014-12-12 DIAGNOSIS — H2513 Age-related nuclear cataract, bilateral: Secondary | ICD-10-CM | POA: Diagnosis not present

## 2014-12-20 DIAGNOSIS — E039 Hypothyroidism, unspecified: Secondary | ICD-10-CM | POA: Diagnosis not present

## 2014-12-20 DIAGNOSIS — E669 Obesity, unspecified: Secondary | ICD-10-CM | POA: Diagnosis not present

## 2014-12-20 DIAGNOSIS — M1990 Unspecified osteoarthritis, unspecified site: Secondary | ICD-10-CM | POA: Diagnosis not present

## 2014-12-20 DIAGNOSIS — E119 Type 2 diabetes mellitus without complications: Secondary | ICD-10-CM | POA: Diagnosis not present

## 2014-12-20 DIAGNOSIS — Z6838 Body mass index (BMI) 38.0-38.9, adult: Secondary | ICD-10-CM | POA: Diagnosis not present

## 2014-12-20 DIAGNOSIS — E785 Hyperlipidemia, unspecified: Secondary | ICD-10-CM | POA: Diagnosis not present

## 2014-12-20 DIAGNOSIS — I10 Essential (primary) hypertension: Secondary | ICD-10-CM | POA: Diagnosis not present

## 2014-12-22 DIAGNOSIS — S8002XA Contusion of left knee, initial encounter: Secondary | ICD-10-CM | POA: Diagnosis not present

## 2015-03-14 DIAGNOSIS — E669 Obesity, unspecified: Secondary | ICD-10-CM | POA: Diagnosis not present

## 2015-03-14 DIAGNOSIS — E785 Hyperlipidemia, unspecified: Secondary | ICD-10-CM | POA: Diagnosis not present

## 2015-03-14 DIAGNOSIS — M1990 Unspecified osteoarthritis, unspecified site: Secondary | ICD-10-CM | POA: Diagnosis not present

## 2015-03-14 DIAGNOSIS — Z419 Encounter for procedure for purposes other than remedying health state, unspecified: Secondary | ICD-10-CM | POA: Diagnosis not present

## 2015-03-14 DIAGNOSIS — Z6839 Body mass index (BMI) 39.0-39.9, adult: Secondary | ICD-10-CM | POA: Diagnosis not present

## 2015-03-14 DIAGNOSIS — I788 Other diseases of capillaries: Secondary | ICD-10-CM | POA: Diagnosis not present

## 2015-03-14 DIAGNOSIS — L820 Inflamed seborrheic keratosis: Secondary | ICD-10-CM | POA: Diagnosis not present

## 2015-03-14 DIAGNOSIS — E039 Hypothyroidism, unspecified: Secondary | ICD-10-CM | POA: Diagnosis not present

## 2015-03-14 DIAGNOSIS — E119 Type 2 diabetes mellitus without complications: Secondary | ICD-10-CM | POA: Diagnosis not present

## 2015-03-14 DIAGNOSIS — D485 Neoplasm of uncertain behavior of skin: Secondary | ICD-10-CM | POA: Diagnosis not present

## 2015-03-14 DIAGNOSIS — D2272 Melanocytic nevi of left lower limb, including hip: Secondary | ICD-10-CM | POA: Diagnosis not present

## 2015-03-14 DIAGNOSIS — I10 Essential (primary) hypertension: Secondary | ICD-10-CM | POA: Diagnosis not present

## 2015-04-03 DIAGNOSIS — I10 Essential (primary) hypertension: Secondary | ICD-10-CM | POA: Diagnosis not present

## 2015-04-03 DIAGNOSIS — E669 Obesity, unspecified: Secondary | ICD-10-CM | POA: Diagnosis not present

## 2015-04-03 DIAGNOSIS — Z6838 Body mass index (BMI) 38.0-38.9, adult: Secondary | ICD-10-CM | POA: Diagnosis not present

## 2015-04-03 DIAGNOSIS — E119 Type 2 diabetes mellitus without complications: Secondary | ICD-10-CM | POA: Diagnosis not present

## 2015-04-03 DIAGNOSIS — Z23 Encounter for immunization: Secondary | ICD-10-CM | POA: Diagnosis not present

## 2015-05-29 DIAGNOSIS — I10 Essential (primary) hypertension: Secondary | ICD-10-CM | POA: Diagnosis not present

## 2015-05-29 DIAGNOSIS — E669 Obesity, unspecified: Secondary | ICD-10-CM | POA: Diagnosis not present

## 2015-05-29 DIAGNOSIS — E119 Type 2 diabetes mellitus without complications: Secondary | ICD-10-CM | POA: Diagnosis not present

## 2015-05-29 DIAGNOSIS — Z6838 Body mass index (BMI) 38.0-38.9, adult: Secondary | ICD-10-CM | POA: Diagnosis not present

## 2015-05-30 DIAGNOSIS — Z853 Personal history of malignant neoplasm of breast: Secondary | ICD-10-CM | POA: Diagnosis not present

## 2015-08-10 DIAGNOSIS — R300 Dysuria: Secondary | ICD-10-CM | POA: Diagnosis not present

## 2015-08-22 DIAGNOSIS — Z0000 Encounter for general adult medical examination without abnormal findings: Secondary | ICD-10-CM | POA: Diagnosis not present

## 2015-08-22 DIAGNOSIS — E559 Vitamin D deficiency, unspecified: Secondary | ICD-10-CM | POA: Diagnosis not present

## 2015-08-22 DIAGNOSIS — E784 Other hyperlipidemia: Secondary | ICD-10-CM | POA: Diagnosis not present

## 2015-08-22 DIAGNOSIS — E119 Type 2 diabetes mellitus without complications: Secondary | ICD-10-CM | POA: Diagnosis not present

## 2015-09-07 DIAGNOSIS — Z1212 Encounter for screening for malignant neoplasm of rectum: Secondary | ICD-10-CM | POA: Diagnosis not present

## 2015-09-14 DIAGNOSIS — Z0000 Encounter for general adult medical examination without abnormal findings: Secondary | ICD-10-CM | POA: Diagnosis not present

## 2015-09-14 DIAGNOSIS — Z6838 Body mass index (BMI) 38.0-38.9, adult: Secondary | ICD-10-CM | POA: Diagnosis not present

## 2015-09-14 DIAGNOSIS — Z1389 Encounter for screening for other disorder: Secondary | ICD-10-CM | POA: Diagnosis not present

## 2015-09-14 DIAGNOSIS — I10 Essential (primary) hypertension: Secondary | ICD-10-CM | POA: Diagnosis not present

## 2015-09-14 DIAGNOSIS — E668 Other obesity: Secondary | ICD-10-CM | POA: Diagnosis not present

## 2015-09-14 DIAGNOSIS — E784 Other hyperlipidemia: Secondary | ICD-10-CM | POA: Diagnosis not present

## 2015-10-30 ENCOUNTER — Other Ambulatory Visit (HOSPITAL_BASED_OUTPATIENT_CLINIC_OR_DEPARTMENT_OTHER): Payer: Medicare Other

## 2015-10-30 DIAGNOSIS — D0512 Intraductal carcinoma in situ of left breast: Secondary | ICD-10-CM | POA: Diagnosis present

## 2015-10-30 DIAGNOSIS — C50919 Malignant neoplasm of unspecified site of unspecified female breast: Secondary | ICD-10-CM

## 2015-10-30 LAB — COMPREHENSIVE METABOLIC PANEL
ALT: 53 U/L (ref 0–55)
AST: 42 U/L — ABNORMAL HIGH (ref 5–34)
Albumin: 4.1 g/dL (ref 3.5–5.0)
Alkaline Phosphatase: 64 U/L (ref 40–150)
Anion Gap: 9 mEq/L (ref 3–11)
BUN: 14.5 mg/dL (ref 7.0–26.0)
CALCIUM: 9.8 mg/dL (ref 8.4–10.4)
CHLORIDE: 106 meq/L (ref 98–109)
CO2: 24 mEq/L (ref 22–29)
Creatinine: 1 mg/dL (ref 0.6–1.1)
EGFR: 52 mL/min/{1.73_m2} — ABNORMAL LOW (ref >90)
Glucose: 154 mg/dl — ABNORMAL HIGH (ref 70–140)
POTASSIUM: 4.4 meq/L (ref 3.5–5.1)
SODIUM: 139 meq/L (ref 136–145)
Total Bilirubin: 0.71 mg/dL (ref 0.20–1.20)
Total Protein: 7.2 g/dL (ref 6.4–8.3)

## 2015-10-30 LAB — CBC WITH DIFFERENTIAL/PLATELET
BASO%: 1.1 % (ref 0.0–2.0)
BASOS ABS: 0.1 10*3/uL (ref 0.0–0.1)
EOS%: 5.1 % (ref 0.0–7.0)
Eosinophils Absolute: 0.3 10*3/uL (ref 0.0–0.5)
HCT: 39.1 % (ref 34.8–46.6)
HGB: 13 g/dL (ref 11.6–15.9)
LYMPH#: 1.6 10*3/uL (ref 0.9–3.3)
LYMPH%: 29.6 % (ref 14.0–49.7)
MCH: 29.7 pg (ref 25.1–34.0)
MCHC: 33.1 g/dL (ref 31.5–36.0)
MCV: 89.6 fL (ref 79.5–101.0)
MONO#: 0.5 10*3/uL (ref 0.1–0.9)
MONO%: 8.5 % (ref 0.0–14.0)
NEUT#: 3 10*3/uL (ref 1.5–6.5)
NEUT%: 55.7 % (ref 38.4–76.8)
Platelets: 171 10*3/uL (ref 145–400)
RBC: 4.36 10*6/uL (ref 3.70–5.45)
RDW: 13.7 % (ref 11.2–14.5)
WBC: 5.4 10*3/uL (ref 3.9–10.3)

## 2015-11-06 ENCOUNTER — Ambulatory Visit: Payer: Medicare Other | Admitting: Nurse Practitioner

## 2015-11-06 ENCOUNTER — Other Ambulatory Visit: Payer: Self-pay | Admitting: *Deleted

## 2015-11-06 ENCOUNTER — Ambulatory Visit (HOSPITAL_BASED_OUTPATIENT_CLINIC_OR_DEPARTMENT_OTHER): Payer: Medicare Other | Admitting: Nurse Practitioner

## 2015-11-06 ENCOUNTER — Encounter: Payer: Self-pay | Admitting: Nurse Practitioner

## 2015-11-06 ENCOUNTER — Telehealth: Payer: Self-pay | Admitting: Oncology

## 2015-11-06 VITALS — BP 149/65 | HR 62 | Temp 98.3°F | Resp 18 | Wt 219.0 lb

## 2015-11-06 DIAGNOSIS — D0512 Intraductal carcinoma in situ of left breast: Secondary | ICD-10-CM | POA: Diagnosis not present

## 2015-11-06 DIAGNOSIS — K760 Fatty (change of) liver, not elsewhere classified: Secondary | ICD-10-CM

## 2015-11-06 DIAGNOSIS — R748 Abnormal levels of other serum enzymes: Secondary | ICD-10-CM | POA: Diagnosis not present

## 2015-11-06 DIAGNOSIS — C50919 Malignant neoplasm of unspecified site of unspecified female breast: Secondary | ICD-10-CM

## 2015-11-06 MED ORDER — LETROZOLE 2.5 MG PO TABS: 2.5000 mg | ORAL_TABLET | Freq: Every day | ORAL | Status: AC

## 2015-11-06 NOTE — Telephone Encounter (Signed)
Gave patient avs report and appointments for April 2018.  °

## 2015-11-06 NOTE — Progress Notes (Signed)
ID: Dawn Bradley   DOB: Sep 19, 1940  MR#: [id number]  GGY#:[id number]  PCP: Geoffery Lyons, MD GYN: SU:  OTHER MD:  CHIEF COMPLAINT: Ductal carcinoma in situ  CURRENT TREATMENT: Letrozole  BREAST CANCER HISTORY: From the original intake note:  Dawn Bradley underwent routine screening mammography at [facility] 05/13/2011: new linear microcalcifications were noted in the posterior mid left breast and she was brought back for additional imaging October 25. This showed a 2 cm cluster of pleomorphic calcifications with associated mass or architectural distortion. Biopsy was performed October 30 and showed ([id number]) ductal carcinoma in situ, grade 3, strongly estrogen receptor positive at 99%, also progesterone receptor positive at 72%.   Bilateral breast MRIs were obtained at [facility] imaging 05/27/2011. This showed an area of non-masslike clumped linear enhancement centrally and posteriorly in the left breast measuring 3.9 cm.  There were no other enhancing lesions and no findings in the right breast of any concern.  There was no evidence of adenopathy. In November 2012 she underwent left lumpectomy and sentinel lymph node sampling, followed by a second surgery for margin clearance 06/27/2011.   Her subsequent history is as noted below.  INTERVAL HISTORY: Dawn Bradley returns today with her husband for follow up of her breast cancer. She has been on letrozole since April 2013 and tolerates this drug well with no side effects that she is aware of. She denies hot flashes, vaginal changes, and arthralgias. The interval history is generally unremarkable.  REVIEW OF SYSTEMS: Dawn Bradley denies fevers, chills, nausea, or vomiting. She continues to have diarrhea and occasionally incontinence of stool as she has little warning before she needs to go. She is taking imodium PRN as well as a probiotic daily. She denies abdominal cramping or pain with the movements. She is scheduled for a visit with Dr. Watt Climes to evaluate  this issue. She denies shortness of breath, chest pain, cough, or palpitations. She has no unusual headaches, dizziness, or vision changes. Her blood sugars are well controlled. She has some pain to her left surgical breast. A detailed review of systems is otherwise stable.  PAST MEDICAL HISTORY: Past Medical History  Diagnosis Date  . GERD (gastroesophageal reflux disease)   . Hypertension   . Hyperlipidemia   . Hypothyroid   . Osteoarthritis   . Cancer     lt breast  . Diabetes mellitus     pre-diabetic    PAST SURGICAL HISTORY: Past Surgical History  Procedure Laterality Date  . Appendectomy  1962  . Cholecystectomy  1971  . Anterior cruciate ligament repair  1960  . Tubal ligation  1980  . Replacement total knee bilateral  09/06/10  . Joint replacement      both knees 2/12  . Breast lumpectomy  06/11/2011    Procedure: LUMPECTOMY;  Surgeon: Merrie Roof, MD;  Location: [facility];  Service: General;  Laterality: Left;  Left Needle localization lumpectomy    FAMILY HISTORY Family History  Problem Relation Age of Onset  . Breast cancer Sister   . Cancer Sister     breast  . 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 Her husband of 42 years Aleyah Balik is also retired. Their children are at daughter Dawn Bradley, 41, a homemaker in [place], Dawn Bradley 47 a business executive in [place] and Dawn Bradley 31 lives in [place] and is a homemaker. The patient attends [employer].    ADVANCED DIRECTIVES: in place  HEALTH  MAINTENANCE: Social History  Substance Use Topics  . Smoking status: Former Research scientist (life sciences)  . Smokeless tobacco: Not on file     Comment: smoked in college  . Alcohol Use: Yes   Cholesterol under treatment  Bone density October 2015 normal per patient report Colonoscopy normal 2008  (PAP) 2010    Allergies  Allergen Reactions  . Shellfish Allergy Hives and Swelling  . Codeine     REACTION: heart races  . Tramadol Nausea And Vomiting    Current Outpatient Prescriptions  Medication Sig Dispense Refill  . glipiZIDE (GLUCOTROL) 10 MG tablet Take 5 mg by mouth daily before breakfast. Pt also takes 5 mg at night    . ibuprofen (ADVIL,MOTRIN) 200 MG tablet Take 200 mg by mouth every 6 (six) hours as needed.    Marland Kitchen letrozole (FEMARA) 2.5 MG tablet Take 1 tablet (2.5 mg total) by mouth daily. 90 tablet 3  . Levothyroxine Sodium (SYNTHROID PO) Take 88 mcg by mouth daily.     Marland Kitchen lisinopril (PRINIVIL,ZESTRIL) 10 MG tablet Take 10 mg by mouth daily.     . metformin (FORTAMET) 500 MG (OSM) 24 hr tablet Take 500 mg by mouth 2 (two) times daily with a meal.    . simvastatin (ZOCOR) 10 MG tablet Take 20 mg by mouth at bedtime.     . Vitamin D, Ergocalciferol, (DRISDOL) 50000 UNITS CAPS Take 50,000 Units by mouth. Pt takes on Saturdays.    Marland Kitchen zolpidem (AMBIEN) 5 MG tablet Take 5 mg by mouth at bedtime as needed.      No current facility-administered medications for this visit.    OBJECTIVE: Middle-aged white woman in no acute distress Filed Vitals:   11/06/15 0850  BP: 149/65  Pulse: 62  Temp: 98.3 F (36.8 C)  Resp: 18     Body mass index is 36.44 kg/(m^2).    ECOG FS: 1  Skin: warm, dry  HEENT: sclerae anicteric, conjunctivae pink, oropharynx clear. No thrush or mucositis.  Lymph Nodes: No cervical or supraclavicular lymphadenopathy  Lungs: clear to auscultation bilaterally, no rales, wheezes, or rhonci  Heart: regular rate and rhythm  Abdomen: round, soft, non tender, positive bowel sounds   Musculoskeletal: No focal spinal tenderness, no peripheral edema  Neuro: non focal, well oriented, positive affect  Breasts: left breast status post lumpectomy and radiation. Palpable scar tissue along incision line. Left axilla benign. Right breast unremarkable.  LAB RESULTS: Lab Results  Component Value Date   WBC 5.4 10/30/2015   NEUTROABS 3.0 10/30/2015   HGB 13.0 10/30/2015   HCT 39.1 10/30/2015   MCV 89.6 10/30/2015   PLT 171 10/30/2015      Chemistry      Component Value Date/Time   NA 139 10/30/2015 1105   NA 137 06/25/2011 1400   K 4.4 10/30/2015 1105   K 4.9 06/25/2011 1400   CL 102 06/25/2011 1400   CO2 24 10/30/2015 1105   CO2 23 06/25/2011 1400   BUN 14.5 10/30/2015 1105   BUN 23 06/25/2011 1400   CREATININE 1.0 10/30/2015 1105  CREATININE 0.70 06/25/2011 1400      Component Value Date/Time   CALCIUM 9.8 10/30/2015 1105   CALCIUM 10.1 06/25/2011 1400   ALKPHOS 64 10/30/2015 1105   ALKPHOS 80 05/28/2011 1310   AST 42* 10/30/2015 1105   AST 26 05/28/2011 1310   ALT 53 10/30/2015 1105   ALT 27 05/28/2011 1310   BILITOT 0.71 10/30/2015 1105   BILITOT 0.4 05/28/2011 1310       No results found for: LABCA2  No components found for: LABCA125  No results for input(s): INR in the last 168 hours.  Urinalysis    Component Value Date/Time   COLORURINE YELLOW 12/31/2010 0935   APPEARANCEUR CLOUDY* 12/31/2010 0935   LABSPEC 1.019 12/31/2010 0935   PHURINE 5.0 12/31/2010 0935   GLUCOSEU NEGATIVE 12/31/2010 0935   HGBUR NEGATIVE 12/31/2010 0935   BILIRUBINUR NEGATIVE 12/31/2010 0935   KETONESUR NEGATIVE 12/31/2010 0935   PROTEINUR NEGATIVE 12/31/2010 0935   UROBILINOGEN 0.2 12/31/2010 0935   NITRITE NEGATIVE 12/31/2010 0935   LEUKOCYTESUR SMALL* 12/31/2010 0935    STUDIES: No results found.  Most recent mammogram at [facility] on 05/30/2015 was unremarkable.  ASSESSMENT: 75 y.o. [place] woman status post left lumpectomy and sentinel node biopsy  November 2012 (with a second procedure for margin clearance) for ductal carcinoma in situ, grade 2, strongly estrogen and progesterone receptor positive, HER-2 negative.   (1)Completed radiation March 2013   (2) Started letrozole April 2013   PLAN:  Deardra is doing well as far as her breast cancer is concerned. She is now 4.5 years out from her definitive surgery with no evidence of recurrent disease. The labs were reviewed in detail and were stable. The AST elevation is presumed to be related to fatty liver disease which has been visualized via CT scan last year. She is tolerating the letrozole well and will continue this drug for 5 years of antiestrogen therapy.  Delancey will have a repeat mammogram in November. She will return in 1 year for follow up with Dr. Jana Hakim. During this visit she will be eligible to "graduate" from observation here and discontinue antiestrogen thearpy. She understands and agrees with this plan. She knows the goal of treatment in her case is cure. She has been encouraged to call with any issues that might arise before her next visit here.   Genelle Gather Melena Hayes    11/06/2015

## 2015-11-12 IMAGING — CT CT ABD-PELV W/ CM
2 of 5 series · 16 of 46 positions shown, 18 images · IV contrast (READICAT/WATER & [ID] OMNI 300)
Comparison: CT abdomen pelvis of 01/31/2008 and CT chest abdomen
pelvis of 06/05/2006

CLINICAL DATA: Left lower quadrant pain, diarrhea for 3 weeks,
completed antibiotic treatment 2 days ago, history of left breast
carcinoma diagnosed in 4074 with lumpectomy and radiation treatment.

EXAM:
CT ABDOMEN AND PELVIS WITH CONTRAST
TECHNIQUE: Multidetector CT imaging of the abdomen and pelvis was performed
using the standard protocol following bolus administration of
intravenous contrast.
CONTRAST:  125mL OMNIPAQUE IOHEXOL 300 MG/ML  SOLN

[Series 2: abd/pelvis with · axial · 0.96mm/px · z∈[-427,-37]mm · 13 of 90 slices shown, 15 images]
[im 6/90  soft-tissue]
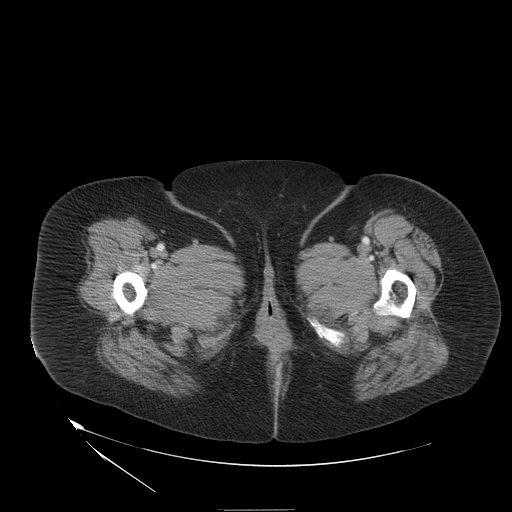
[im 6/90  bone]
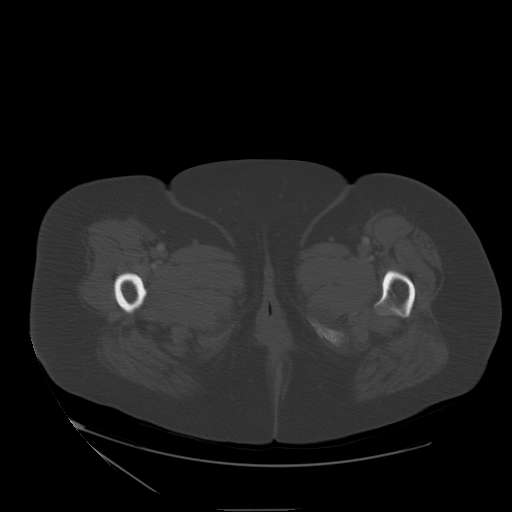
[im 11/90  soft-tissue]
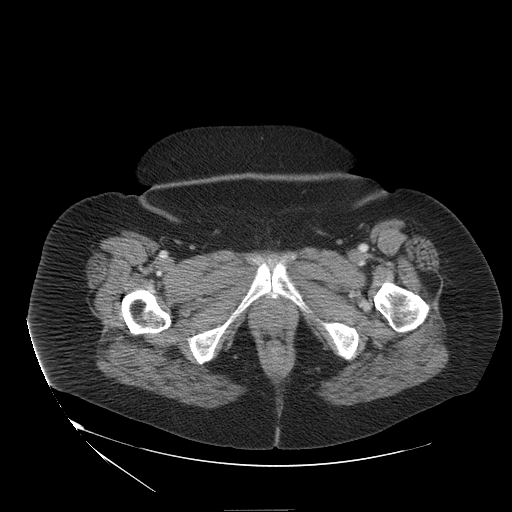
[im 21/90  soft-tissue]
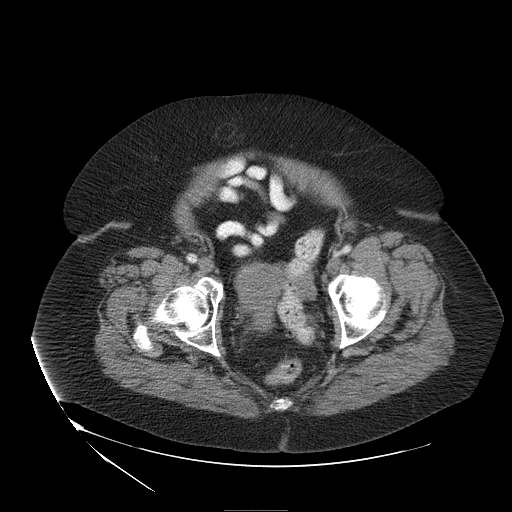
[im 27/90  soft-tissue]
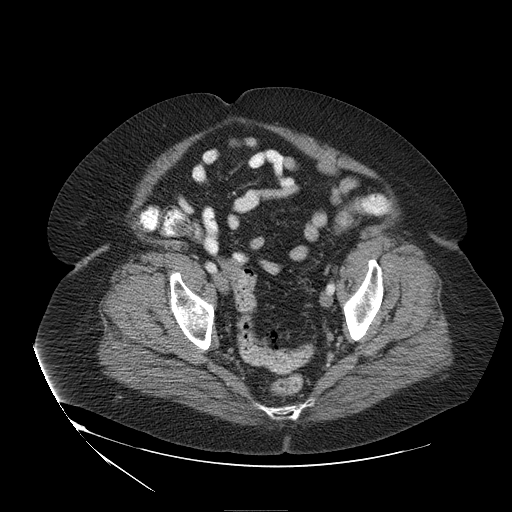
[im 32/90  soft-tissue]
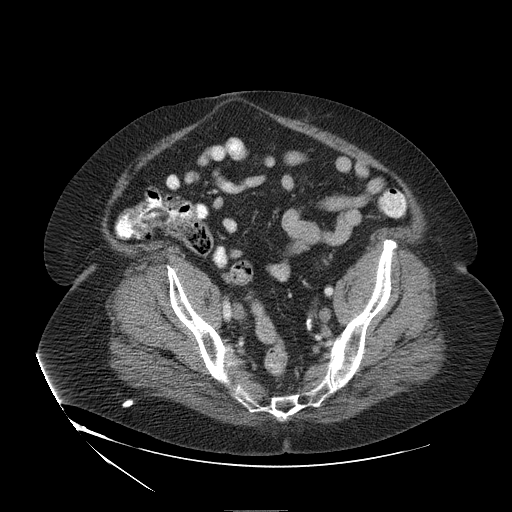
[im 37/90  soft-tissue]
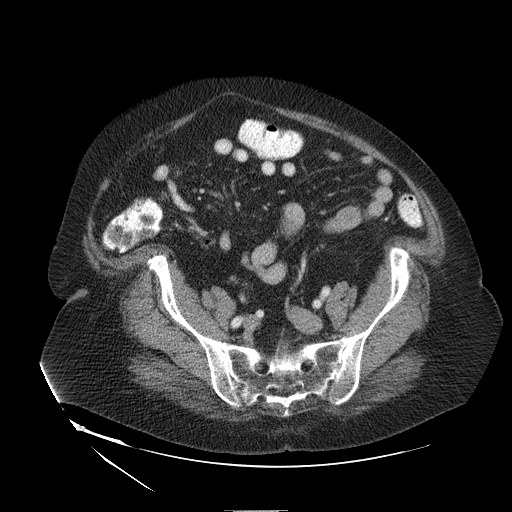
[im 48/90  soft-tissue]
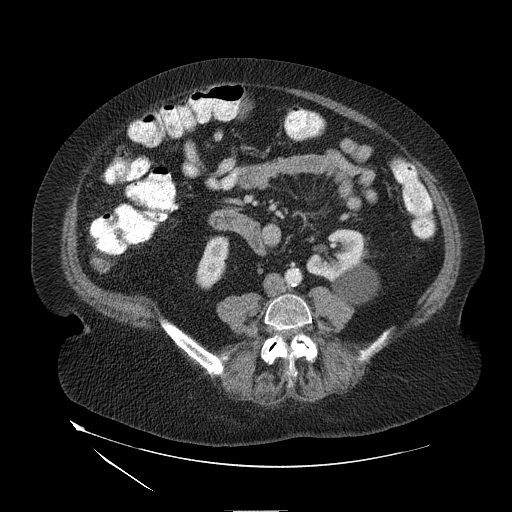
[im 53/90  soft-tissue]
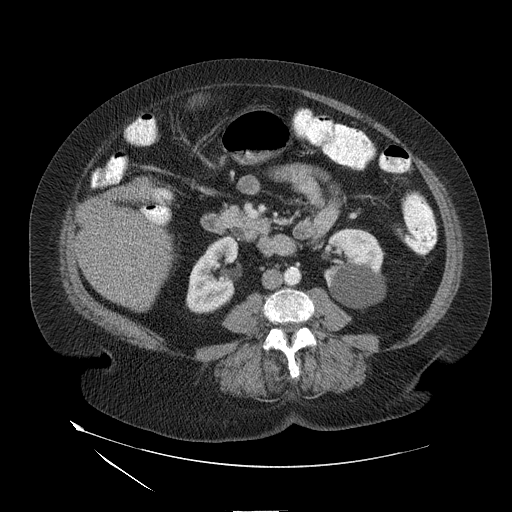
[im 58/90  soft-tissue]
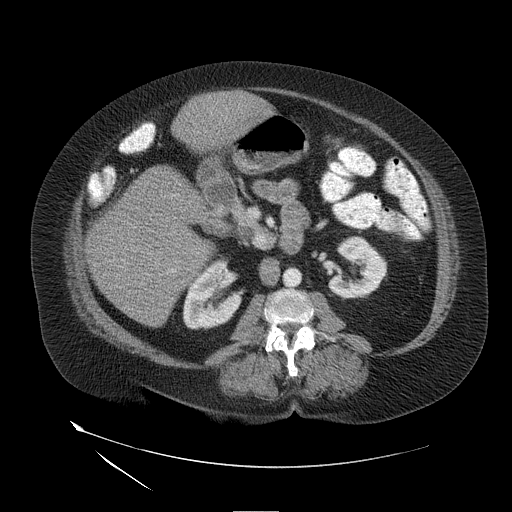
[im 58/90  bone]
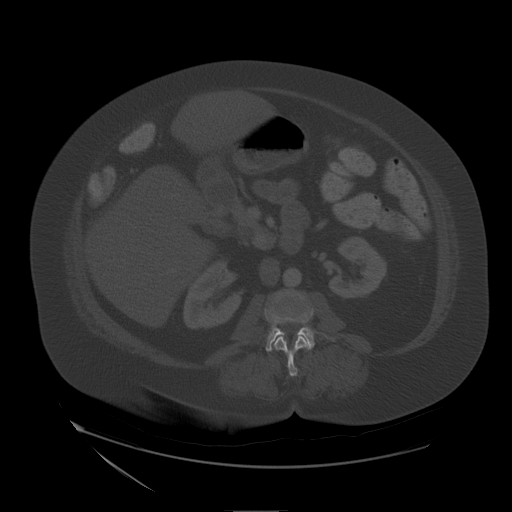
[im 63/90  soft-tissue]
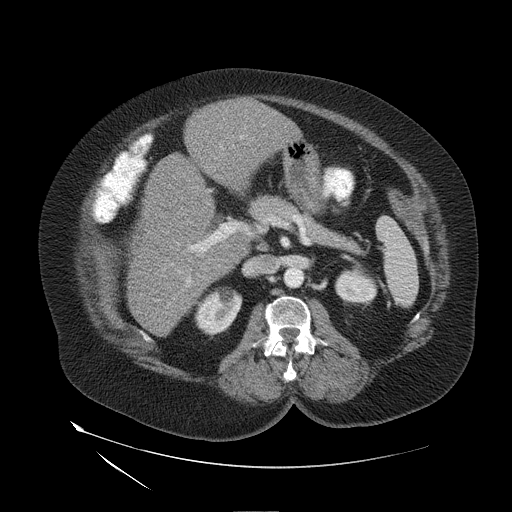
[im 69/90  soft-tissue]
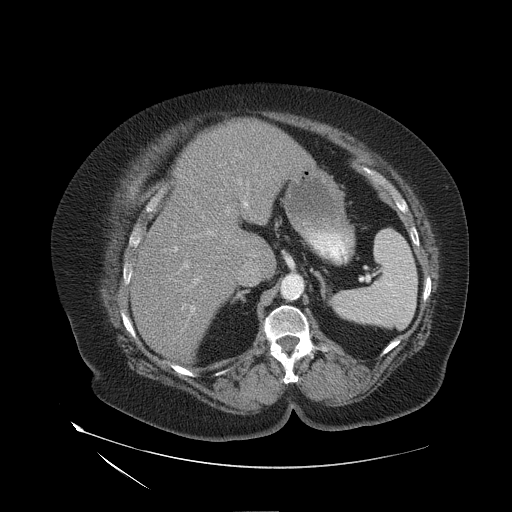
[im 79/90  soft-tissue]
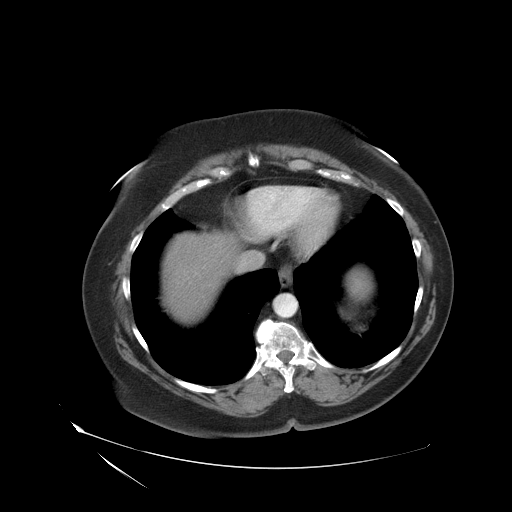
[im 84/90  soft-tissue]
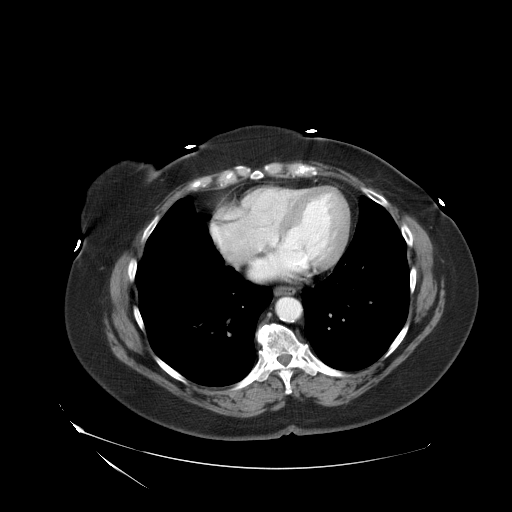

[Series 400: cor · coronal · 0.96mm/px · 3 of 183 slices shown]
[im 61/183  soft-tissue]
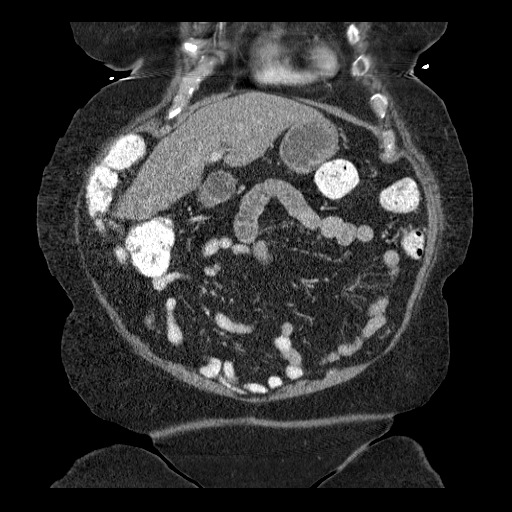
[im 81/183  soft-tissue]
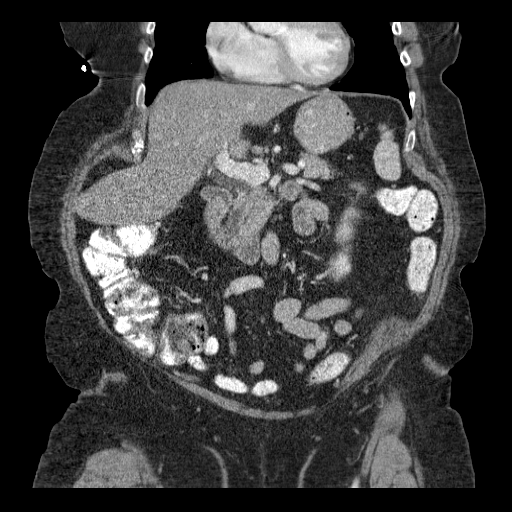
[im 102/183  soft-tissue]
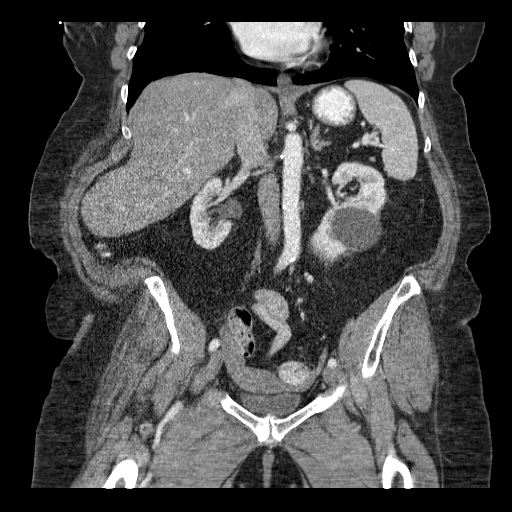

[16 of 46 positions shown; findings below may reference images not displayed]

FINDINGS: On images through the lung bases there is an 8 mm noncalcified
nodule anteriorly within the lingula. Compared to the CT of 3115,
this lingular nodule was present and has not changed in size in the
interval. No pleural effusion is seen.

Soft tissue is noted deep in the left inferior breast which may be
related to scarring from prior lumpectomy and radiation therapy but
clinical correlation is recommended. This area measures 2.2 cm in
diameter. The liver enhances with no focal abnormality, and probable
fatty infiltration diffusely is unchanged. The left lobe is somewhat
prominent. A calcification is noted high in the right lobe laterally
most consistent with granuloma. The gallbladder appears to have been
resected. The common bile duct is prominent but stable most likely
normal post cholecystectomy. Correlate with LFTs. The pancreas is
normal in size and the pancreatic duct is not dilated. The adrenal
glands and spleen are unremarkable. The stomach is not well
distended but no gross abnormality is evident. The kidneys enhance
with no calculus. A large cyst emanates from the lower pole of the
left kidney which is unchanged with a small cyst emanating from the
upper pole of the right kidney anteriorly measuring 1.7 cm on
coronal images with an attenuation of less than 10 HU. The abdominal
aorta is normal in caliber. No adenopathy is seen.

The urinary bladder is not well distended. A small umbilical hernia
is noted containing only fat. The uterus is normal in size and no
adnexal lesion is seen. No fluid is noted within the pelvis. The
colon is not distended. No acute abnormality is seen. Only a few
scattered diverticula are present throughout the colon but no
evidence of diverticulitis is seen. The terminal ileum is
unremarkable. The appendix has by history been resected previously.
No bowel wall edema is noted and the small bowel is normal in
caliber. The lumbar vertebrae are in normal alignment with normal
disc spaces. There are degenerative changes throughout the facet
joints of the lower lumbar spine.
IMPRESSION: 1. No explanation for the patient's abdominal pain and diarrhea is
seen. Stable 8 mm noncalcified nodule in the lingula.
2. There are scattered colonic diverticula but no diverticulitis is
noted.
3. Soft tissue lesion deep in the left inferior breast may be
related to prior surgery and scarring, but clinical correlation is
recommended.
4. Fatty infiltration of the liver as previously identified.

## 2015-12-10 ENCOUNTER — Other Ambulatory Visit: Payer: Self-pay | Admitting: Gastroenterology

## 2015-12-10 DIAGNOSIS — Z86010 Personal history of colonic polyps: Secondary | ICD-10-CM | POA: Diagnosis not present

## 2015-12-10 DIAGNOSIS — A09 Infectious gastroenteritis and colitis, unspecified: Secondary | ICD-10-CM | POA: Diagnosis not present

## 2015-12-10 DIAGNOSIS — R635 Abnormal weight gain: Secondary | ICD-10-CM | POA: Diagnosis not present

## 2015-12-11 ENCOUNTER — Ambulatory Visit
Admission: RE | Admit: 2015-12-11 | Discharge: 2015-12-11 | Disposition: A | Discharge status: Home / Self Care | Payer: Medicare Other | Source: Ambulatory Visit | Attending: Gastroenterology | Admitting: Gastroenterology

## 2015-12-11 DIAGNOSIS — N281 Cyst of kidney, acquired: Secondary | ICD-10-CM | POA: Diagnosis not present

## 2015-12-11 DIAGNOSIS — R635 Abnormal weight gain: Secondary | ICD-10-CM

## 2015-12-25 ENCOUNTER — Other Ambulatory Visit: Payer: Self-pay | Admitting: Internal Medicine

## 2015-12-25 DIAGNOSIS — I714 Abdominal aortic aneurysm, without rupture, unspecified: Secondary | ICD-10-CM

## 2016-01-30 ENCOUNTER — Ambulatory Visit
Admission: RE | Admit: 2016-01-30 | Discharge: 2016-01-30 | Disposition: A | Discharge status: Home / Self Care | Payer: Medicare Other | Source: Ambulatory Visit | Attending: Internal Medicine | Admitting: Internal Medicine

## 2016-01-30 ENCOUNTER — Other Ambulatory Visit: Payer: Self-pay | Admitting: Internal Medicine

## 2016-01-30 DIAGNOSIS — E038 Other specified hypothyroidism: Secondary | ICD-10-CM | POA: Diagnosis not present

## 2016-01-30 DIAGNOSIS — I719 Aortic aneurysm of unspecified site, without rupture: Secondary | ICD-10-CM | POA: Diagnosis not present

## 2016-01-30 DIAGNOSIS — R1011 Right upper quadrant pain: Secondary | ICD-10-CM

## 2016-01-30 DIAGNOSIS — E559 Vitamin D deficiency, unspecified: Secondary | ICD-10-CM | POA: Diagnosis not present

## 2016-01-30 DIAGNOSIS — M1990 Unspecified osteoarthritis, unspecified site: Secondary | ICD-10-CM | POA: Diagnosis not present

## 2016-01-30 DIAGNOSIS — E119 Type 2 diabetes mellitus without complications: Secondary | ICD-10-CM | POA: Diagnosis not present

## 2016-01-30 DIAGNOSIS — I714 Abdominal aortic aneurysm, without rupture, unspecified: Secondary | ICD-10-CM

## 2016-01-30 DIAGNOSIS — Z853 Personal history of malignant neoplasm of breast: Secondary | ICD-10-CM | POA: Diagnosis not present

## 2016-01-30 DIAGNOSIS — Z6837 Body mass index (BMI) 37.0-37.9, adult: Secondary | ICD-10-CM | POA: Diagnosis not present

## 2016-01-30 DIAGNOSIS — E784 Other hyperlipidemia: Secondary | ICD-10-CM | POA: Diagnosis not present

## 2016-01-30 DIAGNOSIS — R0781 Pleurodynia: Secondary | ICD-10-CM | POA: Diagnosis not present

## 2016-01-30 DIAGNOSIS — E669 Obesity, unspecified: Secondary | ICD-10-CM | POA: Diagnosis not present

## 2016-01-30 DIAGNOSIS — G4700 Insomnia, unspecified: Secondary | ICD-10-CM | POA: Diagnosis not present

## 2016-01-30 DIAGNOSIS — I77811 Abdominal aortic ectasia: Secondary | ICD-10-CM | POA: Diagnosis not present

## 2016-01-30 DIAGNOSIS — I10 Essential (primary) hypertension: Secondary | ICD-10-CM | POA: Diagnosis not present

## 2016-01-31 ENCOUNTER — Other Ambulatory Visit: Payer: Self-pay | Admitting: Internal Medicine

## 2016-01-31 DIAGNOSIS — E119 Type 2 diabetes mellitus without complications: Secondary | ICD-10-CM | POA: Diagnosis not present

## 2016-01-31 DIAGNOSIS — H2513 Age-related nuclear cataract, bilateral: Secondary | ICD-10-CM | POA: Diagnosis not present

## 2016-02-01 DIAGNOSIS — I788 Other diseases of capillaries: Secondary | ICD-10-CM | POA: Diagnosis not present

## 2016-02-01 DIAGNOSIS — D1801 Hemangioma of skin and subcutaneous tissue: Secondary | ICD-10-CM | POA: Diagnosis not present

## 2016-02-01 DIAGNOSIS — L570 Actinic keratosis: Secondary | ICD-10-CM | POA: Diagnosis not present

## 2016-02-01 DIAGNOSIS — Z419 Encounter for procedure for purposes other than remedying health state, unspecified: Secondary | ICD-10-CM | POA: Diagnosis not present

## 2016-02-01 DIAGNOSIS — D2271 Melanocytic nevi of right lower limb, including hip: Secondary | ICD-10-CM | POA: Diagnosis not present

## 2016-02-01 DIAGNOSIS — D2262 Melanocytic nevi of left upper limb, including shoulder: Secondary | ICD-10-CM | POA: Diagnosis not present

## 2016-02-01 DIAGNOSIS — D225 Melanocytic nevi of trunk: Secondary | ICD-10-CM | POA: Diagnosis not present

## 2016-02-01 DIAGNOSIS — L820 Inflamed seborrheic keratosis: Secondary | ICD-10-CM | POA: Diagnosis not present

## 2016-02-01 DIAGNOSIS — L918 Other hypertrophic disorders of the skin: Secondary | ICD-10-CM | POA: Diagnosis not present

## 2016-02-04 ENCOUNTER — Other Ambulatory Visit: Payer: Self-pay | Admitting: Gastroenterology

## 2016-02-04 DIAGNOSIS — D126 Benign neoplasm of colon, unspecified: Secondary | ICD-10-CM | POA: Diagnosis not present

## 2016-02-04 DIAGNOSIS — D128 Benign neoplasm of rectum: Secondary | ICD-10-CM | POA: Diagnosis not present

## 2016-02-04 DIAGNOSIS — K621 Rectal polyp: Secondary | ICD-10-CM | POA: Diagnosis not present

## 2016-02-04 DIAGNOSIS — Z86010 Personal history of colonic polyps: Secondary | ICD-10-CM | POA: Diagnosis not present

## 2016-02-04 DIAGNOSIS — D122 Benign neoplasm of ascending colon: Secondary | ICD-10-CM | POA: Diagnosis not present

## 2016-02-04 DIAGNOSIS — K5730 Diverticulosis of large intestine without perforation or abscess without bleeding: Secondary | ICD-10-CM | POA: Diagnosis not present

## 2016-02-04 DIAGNOSIS — D123 Benign neoplasm of transverse colon: Secondary | ICD-10-CM | POA: Diagnosis not present

## 2016-02-04 DIAGNOSIS — D120 Benign neoplasm of cecum: Secondary | ICD-10-CM | POA: Diagnosis not present

## 2016-02-07 ENCOUNTER — Encounter: Payer: Self-pay | Admitting: Gastroenterology

## 2016-03-05 DIAGNOSIS — A09 Infectious gastroenteritis and colitis, unspecified: Secondary | ICD-10-CM | POA: Diagnosis not present

## 2016-03-05 DIAGNOSIS — Z86010 Personal history of colonic polyps: Secondary | ICD-10-CM | POA: Diagnosis not present

## 2016-03-05 DIAGNOSIS — K5730 Diverticulosis of large intestine without perforation or abscess without bleeding: Secondary | ICD-10-CM | POA: Diagnosis not present

## 2016-03-06 DIAGNOSIS — H0231 Blepharochalasis right upper eyelid: Secondary | ICD-10-CM | POA: Diagnosis not present

## 2016-03-06 DIAGNOSIS — I10 Essential (primary) hypertension: Secondary | ICD-10-CM | POA: Diagnosis not present

## 2016-03-06 DIAGNOSIS — H0234 Blepharochalasis left upper eyelid: Secondary | ICD-10-CM | POA: Diagnosis not present

## 2016-03-06 DIAGNOSIS — H0233 Blepharochalasis right eye, unspecified eyelid: Secondary | ICD-10-CM | POA: Diagnosis not present

## 2016-03-06 DIAGNOSIS — Z9889 Other specified postprocedural states: Secondary | ICD-10-CM | POA: Diagnosis not present

## 2016-03-06 DIAGNOSIS — H5310 Unspecified subjective visual disturbances: Secondary | ICD-10-CM | POA: Diagnosis not present

## 2016-03-06 DIAGNOSIS — Z923 Personal history of irradiation: Secondary | ICD-10-CM | POA: Diagnosis not present

## 2016-03-06 DIAGNOSIS — Z87891 Personal history of nicotine dependence: Secondary | ICD-10-CM | POA: Diagnosis not present

## 2016-04-01 DIAGNOSIS — I788 Other diseases of capillaries: Secondary | ICD-10-CM | POA: Diagnosis not present

## 2016-04-01 DIAGNOSIS — D1801 Hemangioma of skin and subcutaneous tissue: Secondary | ICD-10-CM | POA: Diagnosis not present

## 2016-04-01 DIAGNOSIS — D224 Melanocytic nevi of scalp and neck: Secondary | ICD-10-CM | POA: Diagnosis not present

## 2016-04-01 DIAGNOSIS — D220 Melanocytic nevi of lip: Secondary | ICD-10-CM | POA: Diagnosis not present

## 2016-04-24 DIAGNOSIS — Z124 Encounter for screening for malignant neoplasm of cervix: Secondary | ICD-10-CM | POA: Diagnosis not present

## 2016-05-03 DIAGNOSIS — Z23 Encounter for immunization: Secondary | ICD-10-CM | POA: Diagnosis not present

## 2016-06-04 DIAGNOSIS — R05 Cough: Secondary | ICD-10-CM | POA: Diagnosis not present

## 2016-06-04 DIAGNOSIS — J209 Acute bronchitis, unspecified: Secondary | ICD-10-CM | POA: Diagnosis not present

## 2016-06-04 DIAGNOSIS — J029 Acute pharyngitis, unspecified: Secondary | ICD-10-CM | POA: Diagnosis not present

## 2016-06-04 DIAGNOSIS — R079 Chest pain, unspecified: Secondary | ICD-10-CM | POA: Diagnosis not present

## 2016-06-04 DIAGNOSIS — Z6838 Body mass index (BMI) 38.0-38.9, adult: Secondary | ICD-10-CM | POA: Diagnosis not present

## 2016-06-04 DIAGNOSIS — R6883 Chills (without fever): Secondary | ICD-10-CM | POA: Diagnosis not present

## 2016-06-10 DIAGNOSIS — E669 Obesity, unspecified: Secondary | ICD-10-CM | POA: Diagnosis not present

## 2016-06-10 DIAGNOSIS — I719 Aortic aneurysm of unspecified site, without rupture: Secondary | ICD-10-CM | POA: Diagnosis not present

## 2016-06-10 DIAGNOSIS — G4700 Insomnia, unspecified: Secondary | ICD-10-CM | POA: Diagnosis not present

## 2016-06-10 DIAGNOSIS — Z6838 Body mass index (BMI) 38.0-38.9, adult: Secondary | ICD-10-CM | POA: Diagnosis not present

## 2016-06-10 DIAGNOSIS — E119 Type 2 diabetes mellitus without complications: Secondary | ICD-10-CM | POA: Diagnosis not present

## 2016-06-10 DIAGNOSIS — J209 Acute bronchitis, unspecified: Secondary | ICD-10-CM | POA: Diagnosis not present

## 2016-06-10 DIAGNOSIS — Z853 Personal history of malignant neoplasm of breast: Secondary | ICD-10-CM | POA: Diagnosis not present

## 2016-06-10 DIAGNOSIS — E784 Other hyperlipidemia: Secondary | ICD-10-CM | POA: Diagnosis not present

## 2016-06-10 DIAGNOSIS — I10 Essential (primary) hypertension: Secondary | ICD-10-CM | POA: Diagnosis not present

## 2016-06-10 DIAGNOSIS — R05 Cough: Secondary | ICD-10-CM | POA: Diagnosis not present

## 2016-06-10 DIAGNOSIS — E038 Other specified hypothyroidism: Secondary | ICD-10-CM | POA: Diagnosis not present

## 2016-08-12 DIAGNOSIS — Z853 Personal history of malignant neoplasm of breast: Secondary | ICD-10-CM | POA: Diagnosis not present

## 2016-08-13 DIAGNOSIS — L97921 Non-pressure chronic ulcer of unspecified part of left lower leg limited to breakdown of skin: Secondary | ICD-10-CM | POA: Diagnosis not present

## 2016-08-19 DIAGNOSIS — I788 Other diseases of capillaries: Secondary | ICD-10-CM | POA: Diagnosis not present

## 2016-08-19 DIAGNOSIS — L98499 Non-pressure chronic ulcer of skin of other sites with unspecified severity: Secondary | ICD-10-CM | POA: Diagnosis not present

## 2016-08-19 DIAGNOSIS — D1801 Hemangioma of skin and subcutaneous tissue: Secondary | ICD-10-CM | POA: Diagnosis not present

## 2016-08-19 DIAGNOSIS — L821 Other seborrheic keratosis: Secondary | ICD-10-CM | POA: Diagnosis not present

## 2016-08-27 DIAGNOSIS — J209 Acute bronchitis, unspecified: Secondary | ICD-10-CM | POA: Diagnosis not present

## 2016-09-24 DIAGNOSIS — I10 Essential (primary) hypertension: Secondary | ICD-10-CM | POA: Diagnosis not present

## 2016-09-24 DIAGNOSIS — N390 Urinary tract infection, site not specified: Secondary | ICD-10-CM | POA: Diagnosis not present

## 2016-09-24 DIAGNOSIS — E119 Type 2 diabetes mellitus without complications: Secondary | ICD-10-CM | POA: Diagnosis not present

## 2016-09-24 DIAGNOSIS — E038 Other specified hypothyroidism: Secondary | ICD-10-CM | POA: Diagnosis not present

## 2016-09-24 DIAGNOSIS — R8299 Other abnormal findings in urine: Secondary | ICD-10-CM | POA: Diagnosis not present

## 2016-09-24 DIAGNOSIS — E559 Vitamin D deficiency, unspecified: Secondary | ICD-10-CM | POA: Diagnosis not present

## 2016-10-01 DIAGNOSIS — E559 Vitamin D deficiency, unspecified: Secondary | ICD-10-CM | POA: Diagnosis not present

## 2016-10-01 DIAGNOSIS — M1990 Unspecified osteoarthritis, unspecified site: Secondary | ICD-10-CM | POA: Diagnosis not present

## 2016-10-01 DIAGNOSIS — I10 Essential (primary) hypertension: Secondary | ICD-10-CM | POA: Diagnosis not present

## 2016-10-01 DIAGNOSIS — E669 Obesity, unspecified: Secondary | ICD-10-CM | POA: Diagnosis not present

## 2016-10-01 DIAGNOSIS — E119 Type 2 diabetes mellitus without complications: Secondary | ICD-10-CM | POA: Diagnosis not present

## 2016-10-01 DIAGNOSIS — Z6837 Body mass index (BMI) 37.0-37.9, adult: Secondary | ICD-10-CM | POA: Diagnosis not present

## 2016-10-01 DIAGNOSIS — E038 Other specified hypothyroidism: Secondary | ICD-10-CM | POA: Diagnosis not present

## 2016-10-01 DIAGNOSIS — Z0000 Encounter for general adult medical examination without abnormal findings: Secondary | ICD-10-CM | POA: Diagnosis not present

## 2016-10-01 DIAGNOSIS — E784 Other hyperlipidemia: Secondary | ICD-10-CM | POA: Diagnosis not present

## 2016-10-01 DIAGNOSIS — Z853 Personal history of malignant neoplasm of breast: Secondary | ICD-10-CM | POA: Diagnosis not present

## 2016-10-01 DIAGNOSIS — G4700 Insomnia, unspecified: Secondary | ICD-10-CM | POA: Diagnosis not present

## 2016-10-01 DIAGNOSIS — I719 Aortic aneurysm of unspecified site, without rupture: Secondary | ICD-10-CM | POA: Diagnosis not present

## 2016-11-03 ENCOUNTER — Other Ambulatory Visit: Payer: Self-pay | Admitting: Adult Health

## 2016-11-03 DIAGNOSIS — D0512 Intraductal carcinoma in situ of left breast: Secondary | ICD-10-CM

## 2016-11-04 ENCOUNTER — Other Ambulatory Visit (HOSPITAL_BASED_OUTPATIENT_CLINIC_OR_DEPARTMENT_OTHER): Payer: Medicare Other

## 2016-11-04 ENCOUNTER — Ambulatory Visit (HOSPITAL_BASED_OUTPATIENT_CLINIC_OR_DEPARTMENT_OTHER): Payer: Medicare Other | Admitting: Oncology

## 2016-11-04 ENCOUNTER — Encounter: Payer: Self-pay | Admitting: Oncology

## 2016-11-04 VITALS — BP 151/74 | HR 60 | Temp 97.9°F | Resp 20 | Wt 223.5 lb

## 2016-11-04 DIAGNOSIS — D0512 Intraductal carcinoma in situ of left breast: Secondary | ICD-10-CM

## 2016-11-04 DIAGNOSIS — I10 Essential (primary) hypertension: Secondary | ICD-10-CM

## 2016-11-04 DIAGNOSIS — Z86000 Personal history of in-situ neoplasm of breast: Secondary | ICD-10-CM

## 2016-11-04 LAB — CBC WITH DIFFERENTIAL/PLATELET
BASO%: 0.7 % (ref 0.0–2.0)
Basophils Absolute: 0 10*3/uL (ref 0.0–0.1)
EOS%: 2.1 % (ref 0.0–7.0)
Eosinophils Absolute: 0.1 10*3/uL (ref 0.0–0.5)
HCT: 35.9 % (ref 34.8–46.6)
HGB: 12.1 g/dL (ref 11.6–15.9)
LYMPH#: 1.3 10*3/uL (ref 0.9–3.3)
LYMPH%: 25 % (ref 14.0–49.7)
MCH: 32 pg (ref 25.1–34.0)
MCHC: 33.7 g/dL (ref 31.5–36.0)
MCV: 94.8 fL (ref 79.5–101.0)
MONO#: 0.5 10*3/uL (ref 0.1–0.9)
MONO%: 8.9 % (ref 0.0–14.0)
NEUT#: 3.3 10*3/uL (ref 1.5–6.5)
NEUT%: 63.3 % (ref 38.4–76.8)
PLATELETS: 179 10*3/uL (ref 145–400)
RBC: 3.79 10*6/uL (ref 3.70–5.45)
RDW: 15.1 % — AB (ref 11.2–14.5)
WBC: 5.2 10*3/uL (ref 3.9–10.3)

## 2016-11-04 LAB — COMPREHENSIVE METABOLIC PANEL
ALT: 39 U/L (ref 0–55)
ANION GAP: 10 meq/L (ref 3–11)
AST: 31 U/L (ref 5–34)
Albumin: 3.9 g/dL (ref 3.5–5.0)
Alkaline Phosphatase: 60 U/L (ref 40–150)
BUN: 17.4 mg/dL (ref 7.0–26.0)
CO2: 24 mEq/L (ref 22–29)
Calcium: 9.6 mg/dL (ref 8.4–10.4)
Chloride: 107 mEq/L (ref 98–109)
Creatinine: 0.9 mg/dL (ref 0.6–1.1)
EGFR: 61 mL/min/{1.73_m2} — ABNORMAL LOW (ref >90)
Glucose: 135 mg/dl (ref 70–140)
Potassium: 4.5 mEq/L (ref 3.5–5.1)
Sodium: 141 mEq/L (ref 136–145)
Total Bilirubin: 0.67 mg/dL (ref 0.20–1.20)
Total Protein: 6.9 g/dL (ref 6.4–8.3)

## 2016-11-04 NOTE — Progress Notes (Signed)
ID: Rickey Primus   DOB: May 12, 1941  MR#: [id number]  WUJ#:[id number]  PCP: Geoffery Lyons, MD GYN: SU:  OTHER MD:  CHIEF COMPLAINT: Ductal carcinoma in situ  CURRENT TREATMENT: Letrozole  BREAST CANCER HISTORY: From the original intake note:  Kiva underwent routine screening mammography at [facility] 05/13/2011: new linear microcalcifications were noted in the posterior mid left breast and she was brought back for additional imaging October 25. This showed a 2 cm cluster of pleomorphic calcifications with associated mass or architectural distortion. Biopsy was performed October 30 and showed ([id number]) ductal carcinoma in situ, grade 3, strongly estrogen receptor positive at 99%, also progesterone receptor positive at 72%.   Bilateral breast MRIs were obtained at [facility] imaging 05/27/2011. This showed an area of non-masslike clumped linear enhancement centrally and posteriorly in the left breast measuring 3.9 cm.  There were no other enhancing lesions and no findings in the right breast of any concern.  There was no evidence of adenopathy. In November 2012 she underwent left lumpectomy and sentinel lymph node sampling, followed by a second surgery for margin clearance 06/27/2011.   Her subsequent history is as noted below.  INTERVAL HISTORY: Kashae returns today for follow-up of her estrogen receptor positive breast cancer accompanied by her husband. Continues on letrozole, with excellent tolerance. She has occasional hot flashes. Vaginal dryness is not a major issue. She never developed the arthralgias or myalgias from this. She obtains it at a good price.  REVIEW OF SYSTEMS: Emylia had the flu this winter and she is due for root canal. She exercises by walking and she is going to the gym a couple of times a week with a trainer. She admits she could do more. She lost a repeat. A detailed review of systems today was otherwise stable   PAST MEDICAL HISTORY: Past Medical History:   Diagnosis Date  . Cancer ([place])    lt breast  . Diabetes mellitus    pre-diabetic  . GERD (gastroesophageal reflux disease)   . Hyperlipidemia   . Hypertension   . Hypothyroid   . Osteoarthritis     PAST SURGICAL HISTORY: Past Surgical History:  Procedure Laterality Date  . ANTERIOR CRUCIATE LIGAMENT REPAIR  1960  . APPENDECTOMY  1962  . BREAST LUMPECTOMY  06/11/2011   Procedure: LUMPECTOMY;  Surgeon: Merrie Roof, MD;  Location: [facility];  Service: General;  Laterality: Left;  Left Needle localization lumpectomy  . CHOLECYSTECTOMY  1971  . JOINT REPLACEMENT     both knees 2/12  . REPLACEMENT TOTAL KNEE BILATERAL  09/06/10  . TUBAL LIGATION  1980    FAMILY HISTORY Family History  Problem Relation Age of Onset  . Breast cancer Sister   . Cancer Sister     breast  . 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 Their children are at daughter Vicente Males, 63, a homemaker in [place], Inge Rise 47 a business executive in [place] and Leata Mouse 19 lives in [place]  and is a homemaker. The patient attends [employer].    ADVANCED DIRECTIVES: in place  HEALTH MAINTENANCE: Social History  Substance Use Topics  . Smoking status: Former Research scientist (life sciences)  . Smokeless tobacco: Not on file     Comment: smoked in college  . Alcohol use Yes   Cholesterol under treatment  Bone density October 2015 normal per patient report Colonoscopy normal 2008  (PAP) 2010    Allergies  Allergen Reactions   . Shellfish Allergy Hives and Swelling  . Codeine     REACTION: heart races  . Tramadol Nausea And Vomiting    Current Outpatient Prescriptions  Medication Sig Dispense Refill  . glipiZIDE (GLUCOTROL) 10 MG tablet Take 5 mg by mouth daily before breakfast. Pt also takes 5 mg at night    . ibuprofen (ADVIL,MOTRIN) 200 MG tablet Take 200 mg by mouth every 6 (six) hours as needed.    Marland Kitchen letrozole (FEMARA) 2.5 MG tablet Take 1 tablet (2.5 mg total) by mouth daily. 90 tablet 3  . Levothyroxine Sodium (SYNTHROID PO) Take 88 mcg by mouth daily.     Marland Kitchen lisinopril (PRINIVIL,ZESTRIL) 10 MG tablet Take 10 mg by mouth daily.     . metformin (FORTAMET) 500 MG (OSM) 24 hr tablet Take 500 mg by mouth 2 (two) times daily with a meal.    . simvastatin (ZOCOR) 10 MG tablet Take 20 mg by mouth at bedtime.     . Vitamin D, Ergocalciferol, (DRISDOL) 50000 UNITS CAPS Take 50,000 Units by mouth. Pt takes on Saturdays.    Marland Kitchen zolpidem (AMBIEN) 5 MG tablet Take 5 mg by mouth at bedtime as needed.      No current facility-administered medications for this visit.     OBJECTIVE: Middle-aged white woman Who appears stated age 30:   11/04/16 1316  BP: (!) 151/74  Pulse: 60  Resp: 20  Temp: 97.9 F (36.6 C)     Body mass index is 37.19 kg/m.    ECOG FS: 1  Sclerae unicteric, pupils round and equal Oropharynx clear and moist No cervical or supraclavicular adenopathy Lungs no rales or rhonchi Heart regular rate and rhythm Abd soft, obese, the right breast is benign. The left breast is status post lumpectomy and radiation. There is some discomfort to palpation, which is not unexpected. There is no evidence of local recurrence. Both axillae are benign. nontender, positive bowel sounds MSK no focal spinal tenderness, no upper extremity lymphedema Neuro: nonfocal, well oriented, appropriate affect Breasts: Right breast is unremarkable. Left breast is status post lumpectomy and radiation. There is some  discomfort to palpation, which is not unexpected after this surgery. There is no evidence of disease recurrence. Both axillae are benign.    LAB RESULTS: Lab Results  Component Value Date   WBC 5.2 11/04/2016   NEUTROABS 3.3 11/04/2016   HGB 12.1 11/04/2016   HCT 35.9 11/04/2016   MCV 94.8 11/04/2016   PLT 179 11/04/2016      Chemistry      Component Value Date/Time   NA 141 11/04/2016 1236   K 4.5 11/04/2016 1236   CL 102 06/25/2011 1400   CO2 24 11/04/2016 1236   BUN 17.4 11/04/2016 1236   CREATININE 0.9 11/04/2016 1236      Component Value Date/Time   CALCIUM 9.6 11/04/2016 1236   ALKPHOS 60 11/04/2016 1236   AST 31 11/04/2016 1236   ALT 39 11/04/2016 1236   BILITOT 0.67 11/04/2016 1236  No results found for: LABCA2  No components found for: [id number]  No results for input(s): INR in the last 168 hours.  Urinalysis    Component Value Date/Time   COLORURINE YELLOW 12/31/2010 0935   APPEARANCEUR CLOUDY (A) 12/31/2010 0935   LABSPEC 1.019 12/31/2010 0935   PHURINE 5.0 12/31/2010 0935   GLUCOSEU NEGATIVE 12/31/2010 0935   HGBUR NEGATIVE 12/31/2010 0935   BILIRUBINUR NEGATIVE 12/31/2010 0935   KETONESUR NEGATIVE 12/31/2010 0935   PROTEINUR NEGATIVE 12/31/2010 0935   UROBILINOGEN 0.2 12/31/2010 0935   NITRITE NEGATIVE 12/31/2010 0935   LEUKOCYTESUR SMALL (A) 12/31/2010 0935    STUDIES: Outside studies reviewed  ASSESSMENT: 76 y.o. [place] woman status post left lumpectomy and sentinel node biopsy November 2012 (with a second procedure for margin clearance) for ductal carcinoma in situ, grade 2, strongly estrogen and progesterone receptor positive, HER-2 negative.   (1) Completed radiation March 2013   (2) Started letrozole April 2013, completing 5 years April 2028   PLAN:  Macaiah is now 5-1/2 years out from definitive surgery for her noninvasive breast cancer, with no evidence of disease recurrence. This is very favorable.  She has completed 5  years of letrozole. She understands this is not only greatly reduced her risk of local recurrence, but also cut in half her risk of developing another breast cancer.  In noninvasive disease and for breast cancer prevention we have no data to continue on anti-estrogens be on 5 in high risk invasive disease continuing letrozole and additional 2 years only reduces the risk of metastatic spread by approximately 1%.  Accordingly I am comfortable with Laramie stopping her letrozole and releasing her to her primary care physician's care. All she will need is a yearly mammogram, which she obtains  in November at [facility], and a yearly physician breast exam.  I will be glad to see Elina at any point in the future if on when the need arises, but as of now are making no further routine appointment for her here. MAGRINAT,GUSTAV C    11/04/2016   is with dark periods as

## 2016-11-26 DIAGNOSIS — E119 Type 2 diabetes mellitus without complications: Secondary | ICD-10-CM | POA: Diagnosis not present

## 2017-01-01 DIAGNOSIS — H1013 Acute atopic conjunctivitis, bilateral: Secondary | ICD-10-CM | POA: Diagnosis not present

## 2017-04-07 DIAGNOSIS — L720 Epidermal cyst: Secondary | ICD-10-CM | POA: Diagnosis not present

## 2017-04-07 DIAGNOSIS — D692 Other nonthrombocytopenic purpura: Secondary | ICD-10-CM | POA: Diagnosis not present

## 2017-04-07 DIAGNOSIS — L821 Other seborrheic keratosis: Secondary | ICD-10-CM | POA: Diagnosis not present

## 2017-04-08 DIAGNOSIS — A09 Infectious gastroenteritis and colitis, unspecified: Secondary | ICD-10-CM | POA: Diagnosis not present

## 2017-04-13 DIAGNOSIS — E038 Other specified hypothyroidism: Secondary | ICD-10-CM | POA: Diagnosis not present

## 2017-04-13 DIAGNOSIS — G4700 Insomnia, unspecified: Secondary | ICD-10-CM | POA: Diagnosis not present

## 2017-04-13 DIAGNOSIS — Z853 Personal history of malignant neoplasm of breast: Secondary | ICD-10-CM | POA: Diagnosis not present

## 2017-04-13 DIAGNOSIS — E559 Vitamin D deficiency, unspecified: Secondary | ICD-10-CM | POA: Diagnosis not present

## 2017-04-13 DIAGNOSIS — E784 Other hyperlipidemia: Secondary | ICD-10-CM | POA: Diagnosis not present

## 2017-04-13 DIAGNOSIS — E119 Type 2 diabetes mellitus without complications: Secondary | ICD-10-CM | POA: Diagnosis not present

## 2017-04-13 DIAGNOSIS — Z23 Encounter for immunization: Secondary | ICD-10-CM | POA: Diagnosis not present

## 2017-04-13 DIAGNOSIS — E669 Obesity, unspecified: Secondary | ICD-10-CM | POA: Diagnosis not present

## 2017-04-13 DIAGNOSIS — Z6838 Body mass index (BMI) 38.0-38.9, adult: Secondary | ICD-10-CM | POA: Diagnosis not present

## 2017-04-13 DIAGNOSIS — M1990 Unspecified osteoarthritis, unspecified site: Secondary | ICD-10-CM | POA: Diagnosis not present

## 2017-04-13 DIAGNOSIS — I719 Aortic aneurysm of unspecified site, without rupture: Secondary | ICD-10-CM | POA: Diagnosis not present

## 2017-04-13 DIAGNOSIS — I10 Essential (primary) hypertension: Secondary | ICD-10-CM | POA: Diagnosis not present

## 2017-05-05 DIAGNOSIS — Z419 Encounter for procedure for purposes other than remedying health state, unspecified: Secondary | ICD-10-CM | POA: Diagnosis not present

## 2017-05-05 DIAGNOSIS — D1801 Hemangioma of skin and subcutaneous tissue: Secondary | ICD-10-CM | POA: Diagnosis not present

## 2017-05-05 DIAGNOSIS — D225 Melanocytic nevi of trunk: Secondary | ICD-10-CM | POA: Diagnosis not present

## 2017-05-05 DIAGNOSIS — L821 Other seborrheic keratosis: Secondary | ICD-10-CM | POA: Diagnosis not present

## 2017-05-05 DIAGNOSIS — L820 Inflamed seborrheic keratosis: Secondary | ICD-10-CM | POA: Diagnosis not present

## 2017-05-05 DIAGNOSIS — D2262 Melanocytic nevi of left upper limb, including shoulder: Secondary | ICD-10-CM | POA: Diagnosis not present

## 2017-08-18 DIAGNOSIS — Z853 Personal history of malignant neoplasm of breast: Secondary | ICD-10-CM | POA: Diagnosis not present

## 2017-08-26 DIAGNOSIS — L304 Erythema intertrigo: Secondary | ICD-10-CM | POA: Diagnosis not present

## 2017-09-09 DIAGNOSIS — Z87410 Personal history of cervical dysplasia: Secondary | ICD-10-CM | POA: Diagnosis not present

## 2017-09-09 DIAGNOSIS — Z124 Encounter for screening for malignant neoplasm of cervix: Secondary | ICD-10-CM | POA: Diagnosis not present

## 2017-09-09 DIAGNOSIS — Z113 Encounter for screening for infections with a predominantly sexual mode of transmission: Secondary | ICD-10-CM | POA: Diagnosis not present

## 2017-10-15 DIAGNOSIS — E119 Type 2 diabetes mellitus without complications: Secondary | ICD-10-CM | POA: Diagnosis not present

## 2017-10-15 DIAGNOSIS — R82998 Other abnormal findings in urine: Secondary | ICD-10-CM | POA: Diagnosis not present

## 2017-10-15 DIAGNOSIS — E038 Other specified hypothyroidism: Secondary | ICD-10-CM | POA: Diagnosis not present

## 2017-10-15 DIAGNOSIS — E7849 Other hyperlipidemia: Secondary | ICD-10-CM | POA: Diagnosis not present

## 2017-10-15 DIAGNOSIS — E559 Vitamin D deficiency, unspecified: Secondary | ICD-10-CM | POA: Diagnosis not present

## 2017-10-22 DIAGNOSIS — I719 Aortic aneurysm of unspecified site, without rupture: Secondary | ICD-10-CM | POA: Diagnosis not present

## 2017-10-22 DIAGNOSIS — E119 Type 2 diabetes mellitus without complications: Secondary | ICD-10-CM | POA: Diagnosis not present

## 2017-10-22 DIAGNOSIS — Z6837 Body mass index (BMI) 37.0-37.9, adult: Secondary | ICD-10-CM | POA: Diagnosis not present

## 2017-10-22 DIAGNOSIS — E038 Other specified hypothyroidism: Secondary | ICD-10-CM | POA: Diagnosis not present

## 2017-10-22 DIAGNOSIS — Z0000 Encounter for general adult medical examination without abnormal findings: Secondary | ICD-10-CM | POA: Diagnosis not present

## 2017-10-22 DIAGNOSIS — E7849 Other hyperlipidemia: Secondary | ICD-10-CM | POA: Diagnosis not present

## 2017-10-22 DIAGNOSIS — Z1389 Encounter for screening for other disorder: Secondary | ICD-10-CM | POA: Diagnosis not present

## 2017-10-22 DIAGNOSIS — I10 Essential (primary) hypertension: Secondary | ICD-10-CM | POA: Diagnosis not present

## 2017-10-23 DIAGNOSIS — Z1212 Encounter for screening for malignant neoplasm of rectum: Secondary | ICD-10-CM | POA: Diagnosis not present

## 2017-10-27 ENCOUNTER — Other Ambulatory Visit: Payer: Self-pay | Admitting: Internal Medicine

## 2017-10-27 DIAGNOSIS — I719 Aortic aneurysm of unspecified site, without rupture: Secondary | ICD-10-CM

## 2017-10-28 ENCOUNTER — Other Ambulatory Visit: Payer: Self-pay | Admitting: Internal Medicine

## 2017-10-28 DIAGNOSIS — E785 Hyperlipidemia, unspecified: Secondary | ICD-10-CM

## 2017-11-02 ENCOUNTER — Ambulatory Visit
Admission: RE | Admit: 2017-11-02 | Discharge: 2017-11-02 | Disposition: A | Discharge status: Home / Self Care | Payer: Medicare Other | Source: Ambulatory Visit | Attending: Internal Medicine | Admitting: Internal Medicine

## 2017-11-02 DIAGNOSIS — Z136 Encounter for screening for cardiovascular disorders: Secondary | ICD-10-CM | POA: Diagnosis not present

## 2017-11-02 DIAGNOSIS — I10 Essential (primary) hypertension: Secondary | ICD-10-CM | POA: Diagnosis not present

## 2017-11-02 DIAGNOSIS — Z87891 Personal history of nicotine dependence: Secondary | ICD-10-CM | POA: Diagnosis not present

## 2017-11-02 DIAGNOSIS — I719 Aortic aneurysm of unspecified site, without rupture: Secondary | ICD-10-CM

## 2017-11-02 DIAGNOSIS — E785 Hyperlipidemia, unspecified: Secondary | ICD-10-CM

## 2017-11-02 DIAGNOSIS — I77811 Abdominal aortic ectasia: Secondary | ICD-10-CM | POA: Diagnosis not present

## 2017-11-24 DIAGNOSIS — E119 Type 2 diabetes mellitus without complications: Secondary | ICD-10-CM | POA: Diagnosis not present

## 2018-02-01 DIAGNOSIS — I10 Essential (primary) hypertension: Secondary | ICD-10-CM | POA: Diagnosis not present

## 2018-02-01 DIAGNOSIS — I719 Aortic aneurysm of unspecified site, without rupture: Secondary | ICD-10-CM | POA: Diagnosis not present

## 2018-02-01 DIAGNOSIS — Z6838 Body mass index (BMI) 38.0-38.9, adult: Secondary | ICD-10-CM | POA: Diagnosis not present

## 2018-02-01 DIAGNOSIS — E7849 Other hyperlipidemia: Secondary | ICD-10-CM | POA: Diagnosis not present

## 2018-02-01 DIAGNOSIS — M1990 Unspecified osteoarthritis, unspecified site: Secondary | ICD-10-CM | POA: Diagnosis not present

## 2018-02-01 DIAGNOSIS — E559 Vitamin D deficiency, unspecified: Secondary | ICD-10-CM | POA: Diagnosis not present

## 2018-02-01 DIAGNOSIS — E668 Other obesity: Secondary | ICD-10-CM | POA: Diagnosis not present

## 2018-02-01 DIAGNOSIS — G4709 Other insomnia: Secondary | ICD-10-CM | POA: Diagnosis not present

## 2018-02-01 DIAGNOSIS — E038 Other specified hypothyroidism: Secondary | ICD-10-CM | POA: Diagnosis not present

## 2018-02-01 DIAGNOSIS — E1169 Type 2 diabetes mellitus with other specified complication: Secondary | ICD-10-CM | POA: Diagnosis not present

## 2018-02-01 DIAGNOSIS — Z853 Personal history of malignant neoplasm of breast: Secondary | ICD-10-CM | POA: Diagnosis not present

## 2018-02-23 DIAGNOSIS — R079 Chest pain, unspecified: Secondary | ICD-10-CM | POA: Diagnosis not present

## 2018-02-23 DIAGNOSIS — I719 Aortic aneurysm of unspecified site, without rupture: Secondary | ICD-10-CM | POA: Diagnosis not present

## 2018-02-23 DIAGNOSIS — M1990 Unspecified osteoarthritis, unspecified site: Secondary | ICD-10-CM | POA: Diagnosis not present

## 2018-02-23 DIAGNOSIS — E559 Vitamin D deficiency, unspecified: Secondary | ICD-10-CM | POA: Diagnosis not present

## 2018-02-23 DIAGNOSIS — E1169 Type 2 diabetes mellitus with other specified complication: Secondary | ICD-10-CM | POA: Diagnosis not present

## 2018-02-23 DIAGNOSIS — Z853 Personal history of malignant neoplasm of breast: Secondary | ICD-10-CM | POA: Diagnosis not present

## 2018-02-23 DIAGNOSIS — G4700 Insomnia, unspecified: Secondary | ICD-10-CM | POA: Diagnosis not present

## 2018-02-23 DIAGNOSIS — I10 Essential (primary) hypertension: Secondary | ICD-10-CM | POA: Diagnosis not present

## 2018-02-23 DIAGNOSIS — E669 Obesity, unspecified: Secondary | ICD-10-CM | POA: Diagnosis not present

## 2018-02-23 DIAGNOSIS — E038 Other specified hypothyroidism: Secondary | ICD-10-CM | POA: Diagnosis not present

## 2018-02-23 DIAGNOSIS — E7849 Other hyperlipidemia: Secondary | ICD-10-CM | POA: Diagnosis not present

## 2018-02-24 ENCOUNTER — Ambulatory Visit
Admission: RE | Admit: 2018-02-24 | Discharge: 2018-02-24 | Disposition: A | Discharge status: Home / Self Care | Payer: Medicare Other | Source: Ambulatory Visit | Attending: Internal Medicine | Admitting: Internal Medicine

## 2018-02-24 ENCOUNTER — Other Ambulatory Visit: Payer: Self-pay | Admitting: Internal Medicine

## 2018-02-24 ENCOUNTER — Encounter: Payer: Self-pay | Admitting: Radiology

## 2018-02-24 DIAGNOSIS — N1330 Unspecified hydronephrosis: Secondary | ICD-10-CM

## 2018-02-24 DIAGNOSIS — R0789 Other chest pain: Secondary | ICD-10-CM

## 2018-02-24 DIAGNOSIS — I700 Atherosclerosis of aorta: Secondary | ICD-10-CM

## 2018-02-24 DIAGNOSIS — R079 Chest pain, unspecified: Secondary | ICD-10-CM | POA: Diagnosis not present

## 2018-02-24 MED ORDER — IOPAMIDOL (ISOVUE-370) INJECTION 76%
60.0000 mL | Freq: Once | INTRAVENOUS | Status: AC | PRN
  Administered 2018-02-24: 60 mL via INTRAVENOUS

## 2018-03-01 ENCOUNTER — Ambulatory Visit
Admission: RE | Admit: 2018-03-01 | Discharge: 2018-03-01 | Disposition: A | Discharge status: Home / Self Care | Payer: Medicare Other | Source: Ambulatory Visit | Attending: Internal Medicine | Admitting: Internal Medicine

## 2018-03-01 DIAGNOSIS — N1330 Unspecified hydronephrosis: Secondary | ICD-10-CM | POA: Diagnosis not present

## 2018-03-01 MED ORDER — IOPAMIDOL (ISOVUE-300) INJECTION 61%
50.0000 mL | Freq: Once | INTRAVENOUS | Status: AC | PRN
  Administered 2018-03-01: 50 mL via INTRAVENOUS

## 2018-03-29 DIAGNOSIS — N289 Disorder of kidney and ureter, unspecified: Secondary | ICD-10-CM | POA: Diagnosis not present

## 2018-03-29 DIAGNOSIS — N1339 Other hydronephrosis: Secondary | ICD-10-CM | POA: Diagnosis not present

## 2018-04-26 DIAGNOSIS — G4700 Insomnia, unspecified: Secondary | ICD-10-CM | POA: Diagnosis not present

## 2018-04-26 DIAGNOSIS — E1169 Type 2 diabetes mellitus with other specified complication: Secondary | ICD-10-CM | POA: Diagnosis not present

## 2018-04-26 DIAGNOSIS — Z1389 Encounter for screening for other disorder: Secondary | ICD-10-CM | POA: Diagnosis not present

## 2018-04-26 DIAGNOSIS — E559 Vitamin D deficiency, unspecified: Secondary | ICD-10-CM | POA: Diagnosis not present

## 2018-04-26 DIAGNOSIS — I719 Aortic aneurysm of unspecified site, without rupture: Secondary | ICD-10-CM | POA: Diagnosis not present

## 2018-04-26 DIAGNOSIS — E7849 Other hyperlipidemia: Secondary | ICD-10-CM | POA: Diagnosis not present

## 2018-04-26 DIAGNOSIS — E038 Other specified hypothyroidism: Secondary | ICD-10-CM | POA: Diagnosis not present

## 2018-04-26 DIAGNOSIS — N1330 Unspecified hydronephrosis: Secondary | ICD-10-CM | POA: Diagnosis not present

## 2018-04-26 DIAGNOSIS — I10 Essential (primary) hypertension: Secondary | ICD-10-CM | POA: Diagnosis not present

## 2018-04-26 DIAGNOSIS — Z853 Personal history of malignant neoplasm of breast: Secondary | ICD-10-CM | POA: Diagnosis not present

## 2018-04-26 DIAGNOSIS — M1990 Unspecified osteoarthritis, unspecified site: Secondary | ICD-10-CM | POA: Diagnosis not present

## 2018-04-26 DIAGNOSIS — Z23 Encounter for immunization: Secondary | ICD-10-CM | POA: Diagnosis not present

## 2018-04-27 DIAGNOSIS — A09 Infectious gastroenteritis and colitis, unspecified: Secondary | ICD-10-CM | POA: Diagnosis not present

## 2018-04-27 DIAGNOSIS — Z86010 Personal history of colonic polyps: Secondary | ICD-10-CM | POA: Diagnosis not present

## 2018-04-29 ENCOUNTER — Other Ambulatory Visit: Payer: Self-pay | Admitting: Internal Medicine

## 2018-04-29 DIAGNOSIS — Z1231 Encounter for screening mammogram for malignant neoplasm of breast: Secondary | ICD-10-CM

## 2018-05-07 DIAGNOSIS — Z79899 Other long term (current) drug therapy: Secondary | ICD-10-CM | POA: Diagnosis not present

## 2018-05-07 DIAGNOSIS — K219 Gastro-esophageal reflux disease without esophagitis: Secondary | ICD-10-CM | POA: Diagnosis not present

## 2018-05-07 DIAGNOSIS — Z87891 Personal history of nicotine dependence: Secondary | ICD-10-CM | POA: Diagnosis not present

## 2018-05-07 DIAGNOSIS — N1339 Other hydronephrosis: Secondary | ICD-10-CM | POA: Diagnosis not present

## 2018-05-07 DIAGNOSIS — I10 Essential (primary) hypertension: Secondary | ICD-10-CM | POA: Diagnosis not present

## 2018-05-07 DIAGNOSIS — E785 Hyperlipidemia, unspecified: Secondary | ICD-10-CM | POA: Diagnosis not present

## 2018-05-07 DIAGNOSIS — Z7984 Long term (current) use of oral hypoglycemic drugs: Secondary | ICD-10-CM | POA: Diagnosis not present

## 2018-05-07 DIAGNOSIS — E119 Type 2 diabetes mellitus without complications: Secondary | ICD-10-CM | POA: Diagnosis not present

## 2018-05-07 DIAGNOSIS — N2889 Other specified disorders of kidney and ureter: Secondary | ICD-10-CM | POA: Diagnosis not present

## 2018-05-07 DIAGNOSIS — N6489 Other specified disorders of breast: Secondary | ICD-10-CM | POA: Diagnosis not present

## 2018-05-07 DIAGNOSIS — E039 Hypothyroidism, unspecified: Secondary | ICD-10-CM | POA: Diagnosis not present

## 2018-05-11 DIAGNOSIS — N1330 Unspecified hydronephrosis: Secondary | ICD-10-CM | POA: Diagnosis not present

## 2018-05-11 DIAGNOSIS — N2889 Other specified disorders of kidney and ureter: Secondary | ICD-10-CM | POA: Diagnosis not present

## 2018-05-11 DIAGNOSIS — E785 Hyperlipidemia, unspecified: Secondary | ICD-10-CM | POA: Diagnosis not present

## 2018-05-11 DIAGNOSIS — I10 Essential (primary) hypertension: Secondary | ICD-10-CM | POA: Diagnosis not present

## 2018-05-11 DIAGNOSIS — N6489 Other specified disorders of breast: Secondary | ICD-10-CM | POA: Diagnosis not present

## 2018-05-11 DIAGNOSIS — N1339 Other hydronephrosis: Secondary | ICD-10-CM | POA: Diagnosis not present

## 2018-05-11 DIAGNOSIS — K219 Gastro-esophageal reflux disease without esophagitis: Secondary | ICD-10-CM | POA: Diagnosis not present

## 2018-05-25 DIAGNOSIS — G4700 Insomnia, unspecified: Secondary | ICD-10-CM | POA: Diagnosis not present

## 2018-05-25 DIAGNOSIS — C679 Malignant neoplasm of bladder, unspecified: Secondary | ICD-10-CM | POA: Diagnosis not present

## 2018-05-25 DIAGNOSIS — N2889 Other specified disorders of kidney and ureter: Secondary | ICD-10-CM | POA: Diagnosis not present

## 2018-05-25 DIAGNOSIS — Z96653 Presence of artificial knee joint, bilateral: Secondary | ICD-10-CM | POA: Diagnosis not present

## 2018-05-25 DIAGNOSIS — N135 Crossing vessel and stricture of ureter without hydronephrosis: Secondary | ICD-10-CM | POA: Diagnosis not present

## 2018-05-25 DIAGNOSIS — E119 Type 2 diabetes mellitus without complications: Secondary | ICD-10-CM | POA: Diagnosis not present

## 2018-05-25 DIAGNOSIS — K219 Gastro-esophageal reflux disease without esophagitis: Secondary | ICD-10-CM | POA: Diagnosis not present

## 2018-05-25 DIAGNOSIS — Z853 Personal history of malignant neoplasm of breast: Secondary | ICD-10-CM | POA: Diagnosis not present

## 2018-05-25 DIAGNOSIS — Z87891 Personal history of nicotine dependence: Secondary | ICD-10-CM | POA: Diagnosis not present

## 2018-05-25 DIAGNOSIS — Z79899 Other long term (current) drug therapy: Secondary | ICD-10-CM | POA: Diagnosis not present

## 2018-05-25 DIAGNOSIS — E785 Hyperlipidemia, unspecified: Secondary | ICD-10-CM | POA: Diagnosis not present

## 2018-05-25 DIAGNOSIS — E039 Hypothyroidism, unspecified: Secondary | ICD-10-CM | POA: Diagnosis not present

## 2018-05-25 DIAGNOSIS — I10 Essential (primary) hypertension: Secondary | ICD-10-CM | POA: Diagnosis not present

## 2018-05-25 DIAGNOSIS — N1330 Unspecified hydronephrosis: Secondary | ICD-10-CM | POA: Diagnosis not present

## 2018-05-25 DIAGNOSIS — Z7984 Long term (current) use of oral hypoglycemic drugs: Secondary | ICD-10-CM | POA: Diagnosis not present

## 2018-05-25 DIAGNOSIS — Z466 Encounter for fitting and adjustment of urinary device: Secondary | ICD-10-CM | POA: Diagnosis not present

## 2018-06-01 DIAGNOSIS — D692 Other nonthrombocytopenic purpura: Secondary | ICD-10-CM | POA: Diagnosis not present

## 2018-06-01 DIAGNOSIS — D225 Melanocytic nevi of trunk: Secondary | ICD-10-CM | POA: Diagnosis not present

## 2018-06-01 DIAGNOSIS — L820 Inflamed seborrheic keratosis: Secondary | ICD-10-CM | POA: Diagnosis not present

## 2018-06-01 DIAGNOSIS — D1801 Hemangioma of skin and subcutaneous tissue: Secondary | ICD-10-CM | POA: Diagnosis not present

## 2018-06-01 DIAGNOSIS — D2262 Melanocytic nevi of left upper limb, including shoulder: Secondary | ICD-10-CM | POA: Diagnosis not present

## 2018-06-01 DIAGNOSIS — L821 Other seborrheic keratosis: Secondary | ICD-10-CM | POA: Diagnosis not present

## 2018-06-01 DIAGNOSIS — L218 Other seborrheic dermatitis: Secondary | ICD-10-CM | POA: Diagnosis not present

## 2018-06-10 DIAGNOSIS — C679 Malignant neoplasm of bladder, unspecified: Secondary | ICD-10-CM | POA: Diagnosis not present

## 2018-06-10 DIAGNOSIS — R109 Unspecified abdominal pain: Secondary | ICD-10-CM | POA: Diagnosis not present

## 2018-06-10 DIAGNOSIS — R319 Hematuria, unspecified: Secondary | ICD-10-CM | POA: Diagnosis not present

## 2018-06-10 DIAGNOSIS — N2889 Other specified disorders of kidney and ureter: Secondary | ICD-10-CM | POA: Diagnosis not present

## 2018-06-10 DIAGNOSIS — Z466 Encounter for fitting and adjustment of urinary device: Secondary | ICD-10-CM | POA: Diagnosis not present

## 2018-06-10 DIAGNOSIS — N1339 Other hydronephrosis: Secondary | ICD-10-CM | POA: Diagnosis not present

## 2018-07-09 DIAGNOSIS — R112 Nausea with vomiting, unspecified: Secondary | ICD-10-CM | POA: Diagnosis not present

## 2018-07-09 DIAGNOSIS — Z01818 Encounter for other preprocedural examination: Secondary | ICD-10-CM | POA: Diagnosis not present

## 2018-07-09 DIAGNOSIS — C661 Malignant neoplasm of right ureter: Secondary | ICD-10-CM | POA: Diagnosis not present

## 2018-07-09 DIAGNOSIS — C669 Malignant neoplasm of unspecified ureter: Secondary | ICD-10-CM | POA: Diagnosis not present

## 2018-07-09 DIAGNOSIS — G4700 Insomnia, unspecified: Secondary | ICD-10-CM | POA: Diagnosis not present

## 2018-07-09 DIAGNOSIS — K219 Gastro-esophageal reflux disease without esophagitis: Secondary | ICD-10-CM | POA: Diagnosis not present

## 2018-07-09 DIAGNOSIS — Z7984 Long term (current) use of oral hypoglycemic drugs: Secondary | ICD-10-CM | POA: Diagnosis not present

## 2018-07-09 DIAGNOSIS — E119 Type 2 diabetes mellitus without complications: Secondary | ICD-10-CM | POA: Diagnosis not present

## 2018-07-09 DIAGNOSIS — Z87891 Personal history of nicotine dependence: Secondary | ICD-10-CM | POA: Diagnosis not present

## 2018-07-09 DIAGNOSIS — E039 Hypothyroidism, unspecified: Secondary | ICD-10-CM | POA: Diagnosis not present

## 2018-07-09 DIAGNOSIS — K5900 Constipation, unspecified: Secondary | ICD-10-CM | POA: Diagnosis not present

## 2018-07-09 DIAGNOSIS — E669 Obesity, unspecified: Secondary | ICD-10-CM | POA: Diagnosis not present

## 2018-07-09 DIAGNOSIS — E785 Hyperlipidemia, unspecified: Secondary | ICD-10-CM | POA: Diagnosis not present

## 2018-07-09 DIAGNOSIS — I10 Essential (primary) hypertension: Secondary | ICD-10-CM | POA: Diagnosis not present

## 2018-07-09 DIAGNOSIS — Z853 Personal history of malignant neoplasm of breast: Secondary | ICD-10-CM | POA: Diagnosis not present

## 2018-07-09 DIAGNOSIS — Z6836 Body mass index (BMI) 36.0-36.9, adult: Secondary | ICD-10-CM | POA: Diagnosis not present

## 2018-07-10 DIAGNOSIS — I499 Cardiac arrhythmia, unspecified: Secondary | ICD-10-CM | POA: Diagnosis not present

## 2018-07-23 DIAGNOSIS — K219 Gastro-esophageal reflux disease without esophagitis: Secondary | ICD-10-CM | POA: Diagnosis present

## 2018-07-23 DIAGNOSIS — Z9889 Other specified postprocedural states: Secondary | ICD-10-CM | POA: Diagnosis not present

## 2018-07-23 DIAGNOSIS — I10 Essential (primary) hypertension: Secondary | ICD-10-CM | POA: Diagnosis present

## 2018-07-23 DIAGNOSIS — Z7984 Long term (current) use of oral hypoglycemic drugs: Secondary | ICD-10-CM | POA: Diagnosis not present

## 2018-07-23 DIAGNOSIS — E669 Obesity, unspecified: Secondary | ICD-10-CM | POA: Diagnosis present

## 2018-07-23 DIAGNOSIS — E119 Type 2 diabetes mellitus without complications: Secondary | ICD-10-CM | POA: Diagnosis not present

## 2018-07-23 DIAGNOSIS — G4700 Insomnia, unspecified: Secondary | ICD-10-CM | POA: Diagnosis present

## 2018-07-23 DIAGNOSIS — E039 Hypothyroidism, unspecified: Secondary | ICD-10-CM | POA: Diagnosis not present

## 2018-07-23 DIAGNOSIS — Z906 Acquired absence of other parts of urinary tract: Secondary | ICD-10-CM | POA: Diagnosis not present

## 2018-07-23 DIAGNOSIS — Z905 Acquired absence of kidney: Secondary | ICD-10-CM | POA: Diagnosis not present

## 2018-07-23 DIAGNOSIS — C661 Malignant neoplasm of right ureter: Secondary | ICD-10-CM | POA: Diagnosis not present

## 2018-07-23 DIAGNOSIS — D303 Benign neoplasm of bladder: Secondary | ICD-10-CM | POA: Diagnosis not present

## 2018-07-23 DIAGNOSIS — Z6836 Body mass index (BMI) 36.0-36.9, adult: Secondary | ICD-10-CM | POA: Diagnosis not present

## 2018-07-23 DIAGNOSIS — R112 Nausea with vomiting, unspecified: Secondary | ICD-10-CM | POA: Diagnosis not present

## 2018-07-23 DIAGNOSIS — Z79899 Other long term (current) drug therapy: Secondary | ICD-10-CM | POA: Diagnosis not present

## 2018-07-23 DIAGNOSIS — N289 Disorder of kidney and ureter, unspecified: Secondary | ICD-10-CM | POA: Diagnosis not present

## 2018-07-23 DIAGNOSIS — N183 Chronic kidney disease, stage 3 (moderate): Secondary | ICD-10-CM | POA: Diagnosis not present

## 2018-07-23 DIAGNOSIS — Z87891 Personal history of nicotine dependence: Secondary | ICD-10-CM | POA: Diagnosis not present

## 2018-07-23 DIAGNOSIS — I129 Hypertensive chronic kidney disease with stage 1 through stage 4 chronic kidney disease, or unspecified chronic kidney disease: Secondary | ICD-10-CM | POA: Diagnosis not present

## 2018-07-23 DIAGNOSIS — Z853 Personal history of malignant neoplasm of breast: Secondary | ICD-10-CM | POA: Diagnosis not present

## 2018-07-23 DIAGNOSIS — K5900 Constipation, unspecified: Secondary | ICD-10-CM | POA: Diagnosis not present

## 2018-07-23 DIAGNOSIS — E785 Hyperlipidemia, unspecified: Secondary | ICD-10-CM | POA: Diagnosis not present

## 2018-08-17 DIAGNOSIS — C661 Malignant neoplasm of right ureter: Secondary | ICD-10-CM | POA: Diagnosis not present

## 2018-08-17 DIAGNOSIS — Z905 Acquired absence of kidney: Secondary | ICD-10-CM | POA: Diagnosis not present

## 2018-08-17 DIAGNOSIS — N1339 Other hydronephrosis: Secondary | ICD-10-CM | POA: Diagnosis not present

## 2018-08-17 DIAGNOSIS — N2889 Other specified disorders of kidney and ureter: Secondary | ICD-10-CM | POA: Diagnosis not present

## 2018-09-23 ENCOUNTER — Ambulatory Visit
Admission: RE | Admit: 2018-09-23 | Discharge: 2018-09-23 | Disposition: A | Discharge status: Home / Self Care | Payer: Medicare Other | Source: Ambulatory Visit | Attending: Internal Medicine | Admitting: Internal Medicine

## 2018-09-23 DIAGNOSIS — Z1231 Encounter for screening mammogram for malignant neoplasm of breast: Secondary | ICD-10-CM

## 2018-09-23 HISTORY — DX: Personal history of irradiation: Z92.3

## 2018-10-26 DIAGNOSIS — Z905 Acquired absence of kidney: Secondary | ICD-10-CM | POA: Diagnosis not present

## 2018-10-26 DIAGNOSIS — Z8554 Personal history of malignant neoplasm of ureter: Secondary | ICD-10-CM | POA: Diagnosis not present

## 2018-12-21 DIAGNOSIS — H2513 Age-related nuclear cataract, bilateral: Secondary | ICD-10-CM | POA: Diagnosis not present

## 2018-12-21 DIAGNOSIS — C661 Malignant neoplasm of right ureter: Secondary | ICD-10-CM | POA: Diagnosis not present

## 2018-12-21 DIAGNOSIS — E119 Type 2 diabetes mellitus without complications: Secondary | ICD-10-CM | POA: Diagnosis not present

## 2018-12-21 DIAGNOSIS — Z905 Acquired absence of kidney: Secondary | ICD-10-CM | POA: Diagnosis not present

## 2018-12-22 DIAGNOSIS — L720 Epidermal cyst: Secondary | ICD-10-CM | POA: Diagnosis not present

## 2018-12-22 DIAGNOSIS — Z419 Encounter for procedure for purposes other than remedying health state, unspecified: Secondary | ICD-10-CM | POA: Diagnosis not present

## 2018-12-22 DIAGNOSIS — L570 Actinic keratosis: Secondary | ICD-10-CM | POA: Diagnosis not present

## 2018-12-22 DIAGNOSIS — L820 Inflamed seborrheic keratosis: Secondary | ICD-10-CM | POA: Diagnosis not present

## 2018-12-28 DIAGNOSIS — Z124 Encounter for screening for malignant neoplasm of cervix: Secondary | ICD-10-CM | POA: Diagnosis not present

## 2019-02-19 IMAGING — CR DG CHEST 2V
2 series · 2 of 2 positions shown · non-contrast
Comparison: 12/31/2010

CLINICAL DATA: Annual physical examination in a patient with
hypertension.

EXAM:
CHEST - 2 VIEW

[w chest pa]
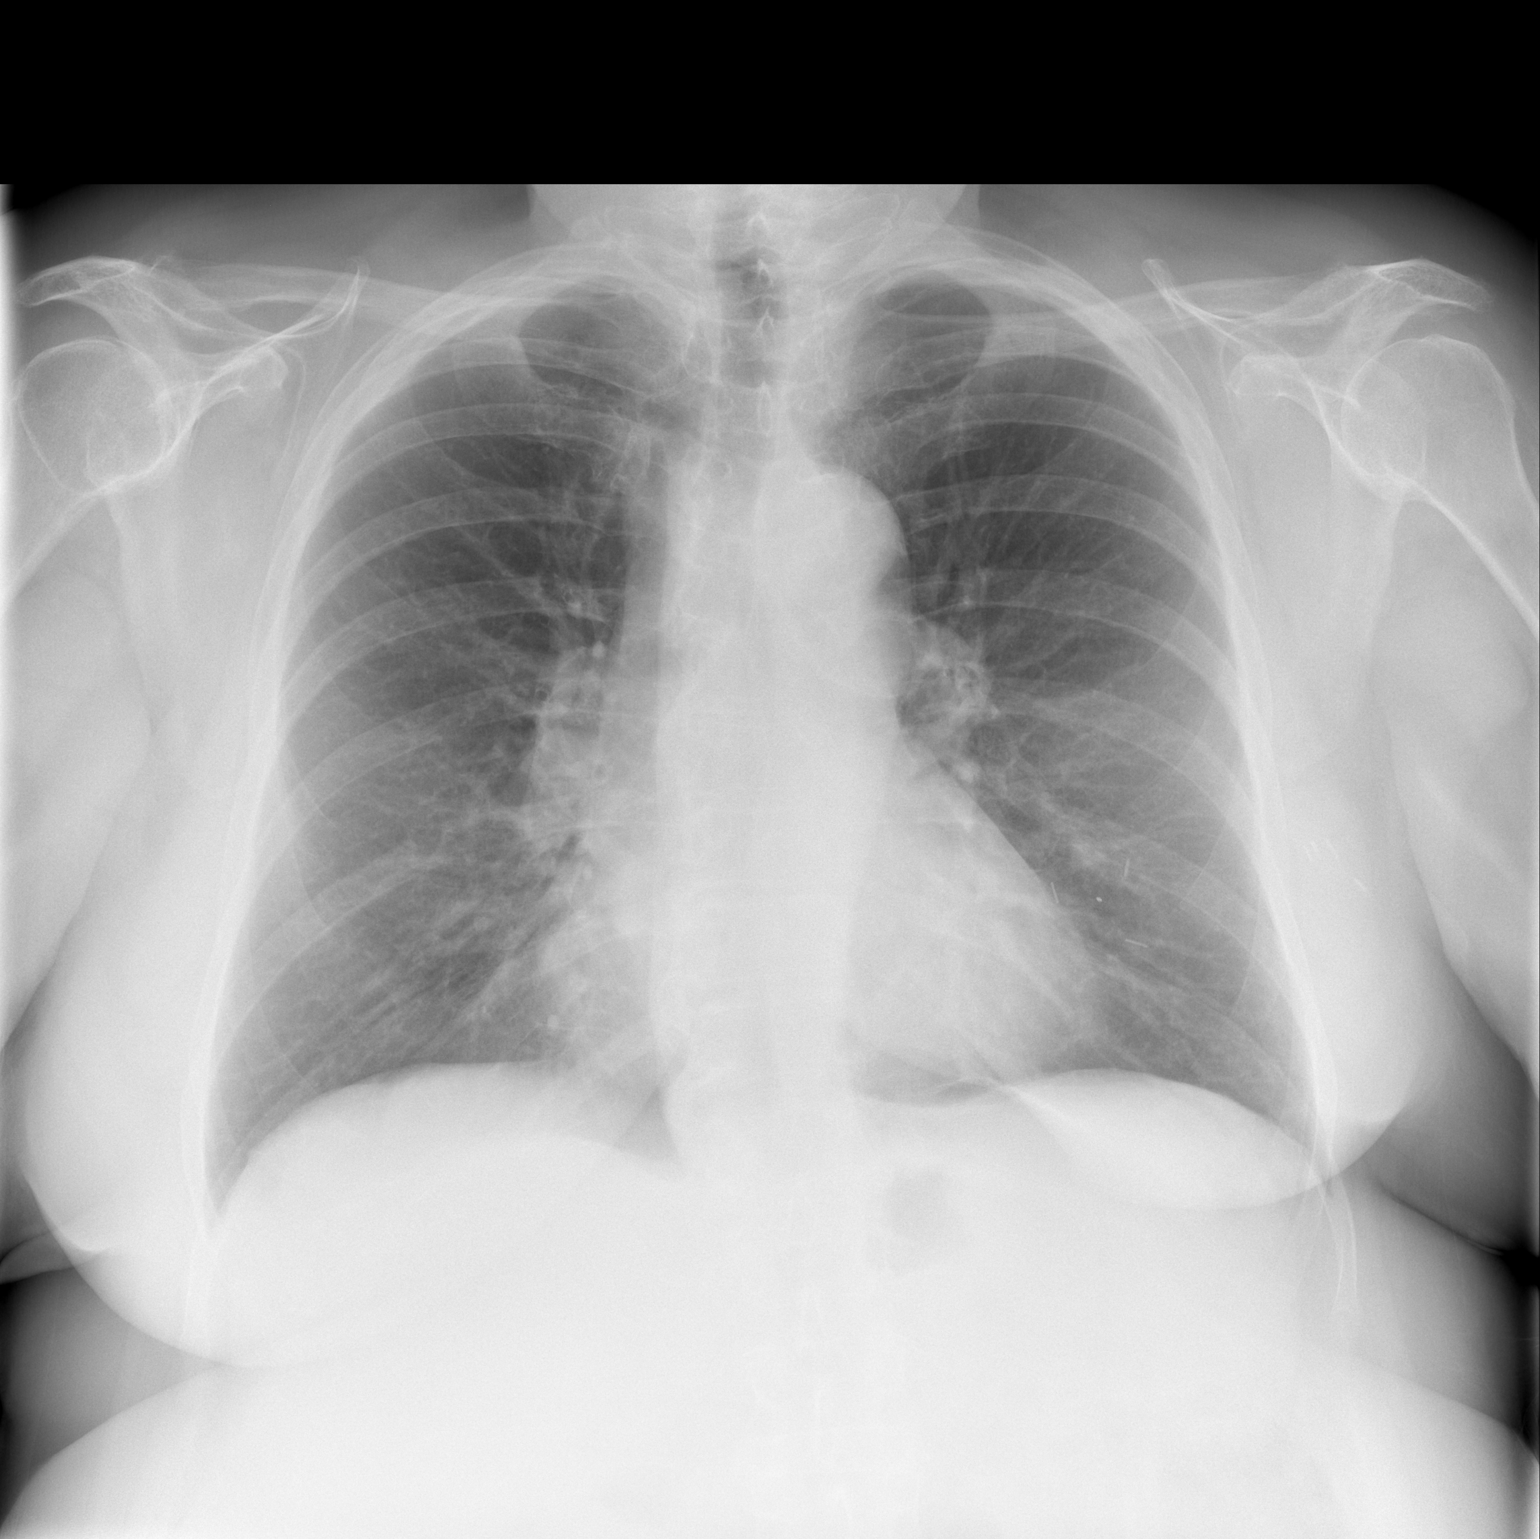

[w chest lat]
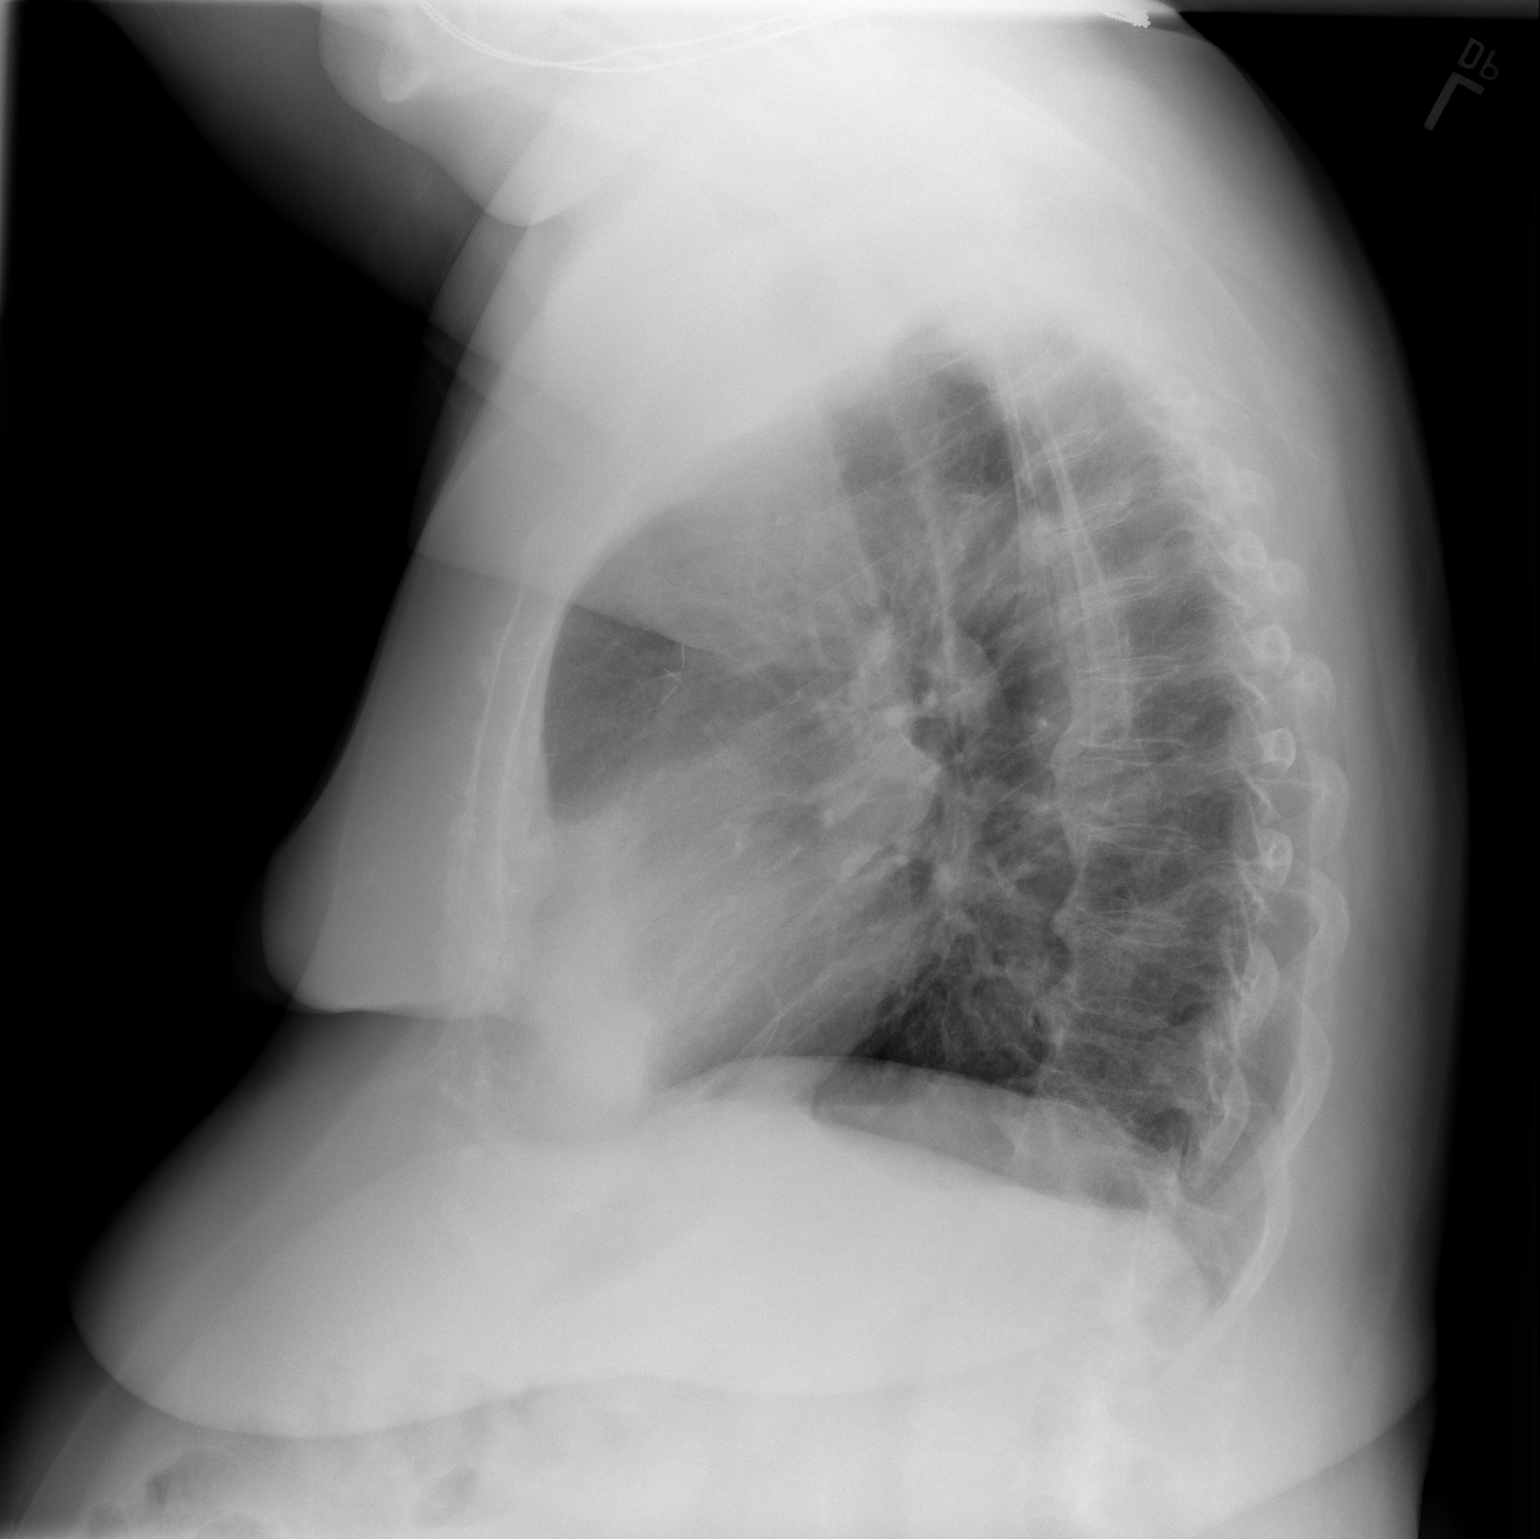

[2 of 2 positions shown; findings below may reference images not displayed]

FINDINGS: Heart size upper limits of normal. Tortuosity of the aorta. The
pulmonary vascularity is normal. Lungs are clear. No effusions.
Previous breast surgery on the left in the interim.
IMPRESSION: No active cardiopulmonary disease.

## 2019-02-23 DIAGNOSIS — E7849 Other hyperlipidemia: Secondary | ICD-10-CM | POA: Diagnosis not present

## 2019-02-23 DIAGNOSIS — E1169 Type 2 diabetes mellitus with other specified complication: Secondary | ICD-10-CM | POA: Diagnosis not present

## 2019-02-23 DIAGNOSIS — E559 Vitamin D deficiency, unspecified: Secondary | ICD-10-CM | POA: Diagnosis not present

## 2019-02-23 DIAGNOSIS — E038 Other specified hypothyroidism: Secondary | ICD-10-CM | POA: Diagnosis not present

## 2019-02-23 DIAGNOSIS — R82998 Other abnormal findings in urine: Secondary | ICD-10-CM | POA: Diagnosis not present

## 2019-02-23 DIAGNOSIS — Z20818 Contact with and (suspected) exposure to other bacterial communicable diseases: Secondary | ICD-10-CM | POA: Diagnosis not present

## 2019-03-03 DIAGNOSIS — G4700 Insomnia, unspecified: Secondary | ICD-10-CM | POA: Diagnosis not present

## 2019-03-03 DIAGNOSIS — E559 Vitamin D deficiency, unspecified: Secondary | ICD-10-CM | POA: Diagnosis not present

## 2019-03-03 DIAGNOSIS — R93421 Abnormal radiologic findings on diagnostic imaging of right kidney: Secondary | ICD-10-CM | POA: Diagnosis not present

## 2019-03-03 DIAGNOSIS — M1990 Unspecified osteoarthritis, unspecified site: Secondary | ICD-10-CM | POA: Diagnosis not present

## 2019-03-03 DIAGNOSIS — I719 Aortic aneurysm of unspecified site, without rupture: Secondary | ICD-10-CM | POA: Diagnosis not present

## 2019-03-03 DIAGNOSIS — E785 Hyperlipidemia, unspecified: Secondary | ICD-10-CM | POA: Diagnosis not present

## 2019-03-03 DIAGNOSIS — E1169 Type 2 diabetes mellitus with other specified complication: Secondary | ICD-10-CM | POA: Diagnosis not present

## 2019-03-03 DIAGNOSIS — E039 Hypothyroidism, unspecified: Secondary | ICD-10-CM | POA: Diagnosis not present

## 2019-03-03 DIAGNOSIS — Z20828 Contact with and (suspected) exposure to other viral communicable diseases: Secondary | ICD-10-CM | POA: Diagnosis not present

## 2019-03-03 DIAGNOSIS — N1330 Unspecified hydronephrosis: Secondary | ICD-10-CM | POA: Diagnosis not present

## 2019-03-03 DIAGNOSIS — E669 Obesity, unspecified: Secondary | ICD-10-CM | POA: Diagnosis not present

## 2019-03-03 DIAGNOSIS — I10 Essential (primary) hypertension: Secondary | ICD-10-CM | POA: Diagnosis not present

## 2019-03-14 DIAGNOSIS — Z23 Encounter for immunization: Secondary | ICD-10-CM | POA: Diagnosis not present

## 2019-05-11 DIAGNOSIS — D224 Melanocytic nevi of scalp and neck: Secondary | ICD-10-CM | POA: Diagnosis not present

## 2019-05-11 DIAGNOSIS — D225 Melanocytic nevi of trunk: Secondary | ICD-10-CM | POA: Diagnosis not present

## 2019-05-11 DIAGNOSIS — L723 Sebaceous cyst: Secondary | ICD-10-CM | POA: Diagnosis not present

## 2019-05-11 DIAGNOSIS — D2262 Melanocytic nevi of left upper limb, including shoulder: Secondary | ICD-10-CM | POA: Diagnosis not present

## 2019-05-11 DIAGNOSIS — L308 Other specified dermatitis: Secondary | ICD-10-CM | POA: Diagnosis not present

## 2019-05-11 DIAGNOSIS — D2271 Melanocytic nevi of right lower limb, including hip: Secondary | ICD-10-CM | POA: Diagnosis not present

## 2019-05-11 DIAGNOSIS — D1801 Hemangioma of skin and subcutaneous tissue: Secondary | ICD-10-CM | POA: Diagnosis not present

## 2019-05-11 DIAGNOSIS — L820 Inflamed seborrheic keratosis: Secondary | ICD-10-CM | POA: Diagnosis not present

## 2019-05-11 DIAGNOSIS — L821 Other seborrheic keratosis: Secondary | ICD-10-CM | POA: Diagnosis not present

## 2019-05-11 DIAGNOSIS — L853 Xerosis cutis: Secondary | ICD-10-CM | POA: Diagnosis not present

## 2019-05-11 DIAGNOSIS — D2272 Melanocytic nevi of left lower limb, including hip: Secondary | ICD-10-CM | POA: Diagnosis not present

## 2019-05-11 DIAGNOSIS — Z419 Encounter for procedure for purposes other than remedying health state, unspecified: Secondary | ICD-10-CM | POA: Diagnosis not present

## 2019-06-30 DIAGNOSIS — Z8554 Personal history of malignant neoplasm of ureter: Secondary | ICD-10-CM | POA: Diagnosis not present

## 2019-06-30 DIAGNOSIS — K838 Other specified diseases of biliary tract: Secondary | ICD-10-CM | POA: Diagnosis not present

## 2019-06-30 DIAGNOSIS — Z85528 Personal history of other malignant neoplasm of kidney: Secondary | ICD-10-CM | POA: Diagnosis not present

## 2019-06-30 DIAGNOSIS — N1339 Other hydronephrosis: Secondary | ICD-10-CM | POA: Diagnosis not present

## 2019-06-30 DIAGNOSIS — Z905 Acquired absence of kidney: Secondary | ICD-10-CM | POA: Diagnosis not present

## 2019-07-27 DIAGNOSIS — E039 Hypothyroidism, unspecified: Secondary | ICD-10-CM | POA: Diagnosis not present

## 2019-07-27 DIAGNOSIS — M1990 Unspecified osteoarthritis, unspecified site: Secondary | ICD-10-CM | POA: Diagnosis not present

## 2019-07-27 DIAGNOSIS — E1169 Type 2 diabetes mellitus with other specified complication: Secondary | ICD-10-CM | POA: Diagnosis not present

## 2019-07-27 DIAGNOSIS — Z1331 Encounter for screening for depression: Secondary | ICD-10-CM | POA: Diagnosis not present

## 2019-07-27 DIAGNOSIS — A09 Infectious gastroenteritis and colitis, unspecified: Secondary | ICD-10-CM | POA: Diagnosis not present

## 2019-07-27 DIAGNOSIS — R933 Abnormal findings on diagnostic imaging of other parts of digestive tract: Secondary | ICD-10-CM | POA: Diagnosis not present

## 2019-07-27 DIAGNOSIS — E785 Hyperlipidemia, unspecified: Secondary | ICD-10-CM | POA: Diagnosis not present

## 2019-07-27 DIAGNOSIS — I10 Essential (primary) hypertension: Secondary | ICD-10-CM | POA: Diagnosis not present

## 2019-08-05 ENCOUNTER — Ambulatory Visit: Payer: Medicare Other | Attending: Internal Medicine

## 2019-08-05 DIAGNOSIS — Z23 Encounter for immunization: Secondary | ICD-10-CM | POA: Diagnosis not present

## 2019-08-05 NOTE — Progress Notes (Signed)
   Covid-19 Vaccination Clinic  Name:  [name]    MRN: [id number] DOB: 1940/11/08  08/05/2019  [name] was observed post Covid-19 immunization for 30 minutes based on pre-vaccination screening without incidence. She was provided with Vaccine Information Sheet and instruction to access the V-Safe system.   [name] was instructed to call 911 with any severe reactions post vaccine: Marland Kitchen Difficulty breathing  . Swelling of your face and throat  . A fast heartbeat  . A bad rash all over your body  . Dizziness and weakness    Immunizations Administered    Name Date Dose VIS Date Route   Pfizer COVID-19 Vaccine 08/05/2019 11:03 AM 0.3 mL 07/01/2019 Intramuscular   Manufacturer: [place]   Lot: [id number]   [place]: [id number]

## 2019-08-08 DIAGNOSIS — T1512XA Foreign body in conjunctival sac, left eye, initial encounter: Secondary | ICD-10-CM | POA: Diagnosis not present

## 2019-08-15 IMAGING — US US AORTA
1 series · 14 of 23 positions shown · non-contrast
Comparison: 01/30/2016

CLINICAL DATA: Follow-up abdominal aortic ectasia.

EXAM:
ULTRASOUND OF ABDOMINAL AORTA
TECHNIQUE: Ultrasound examination of the abdominal aorta was performed to
evaluate for abdominal aortic aneurysm.

[Series 1: us aorta · 0.30mm/px · 14 of 23 slices shown]
[im 1/23]
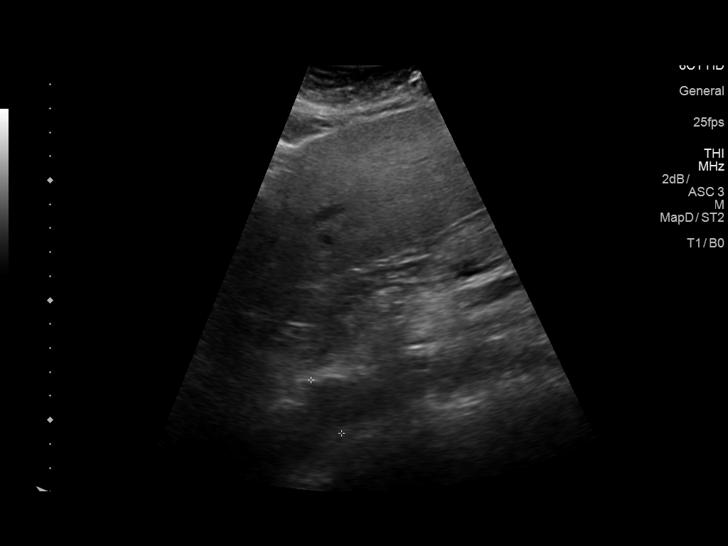
[im 3/23]
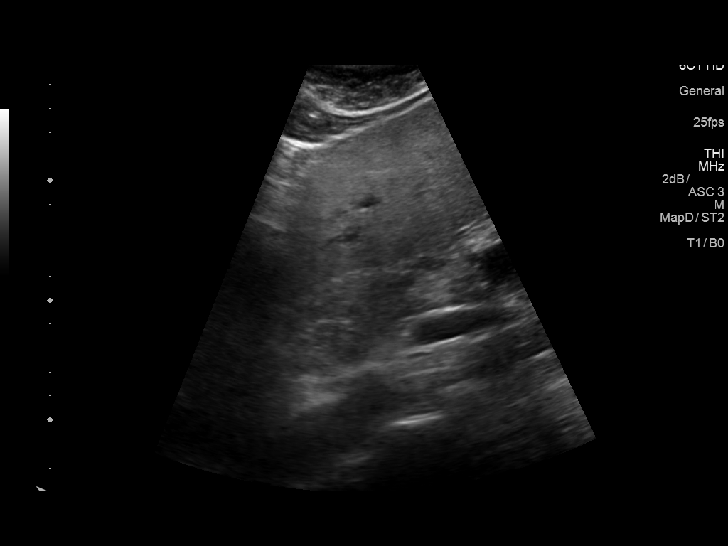
[im 5/23]
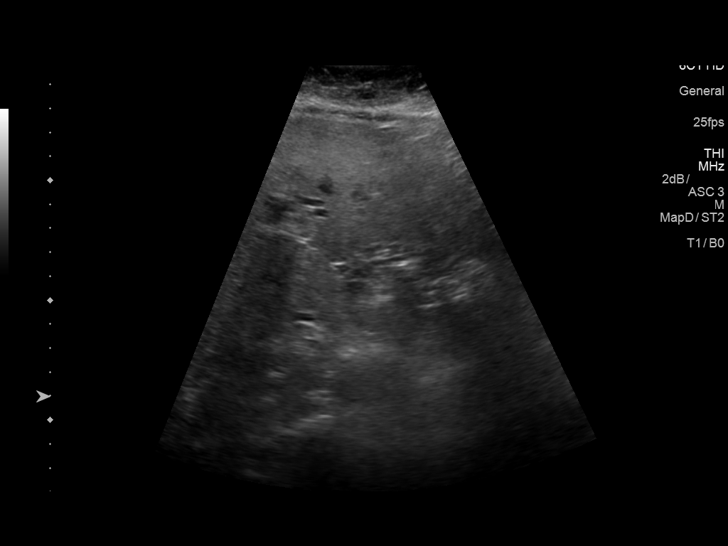
[im 6/23]
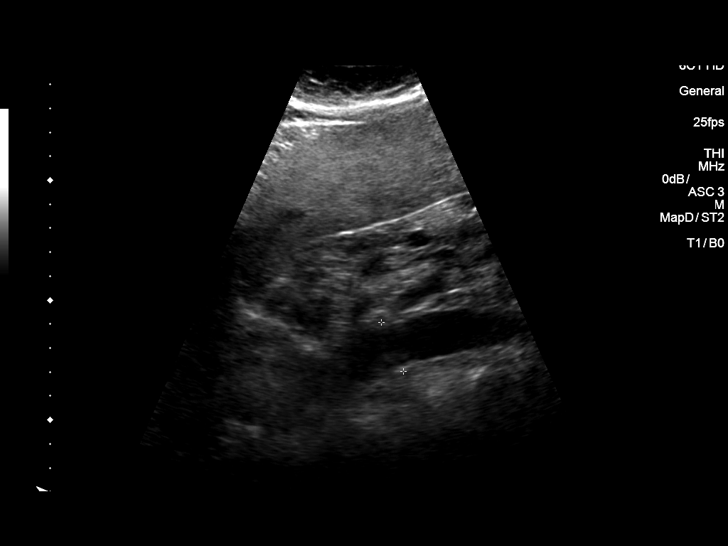
[im 8/23]
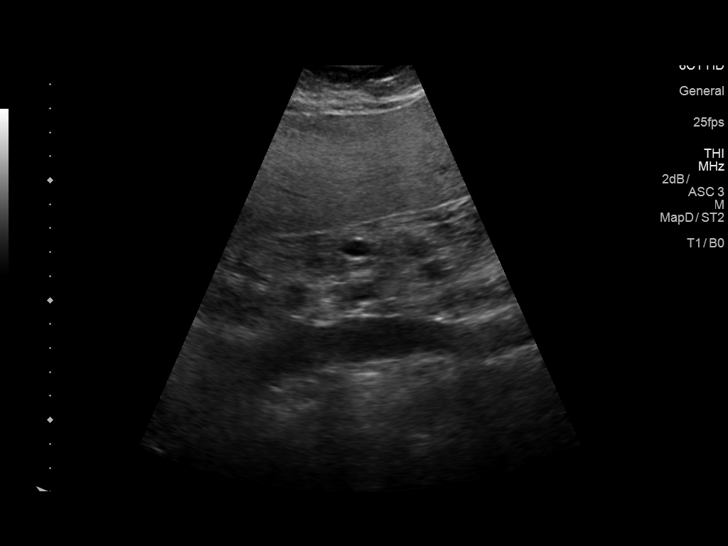
[im 10/23]
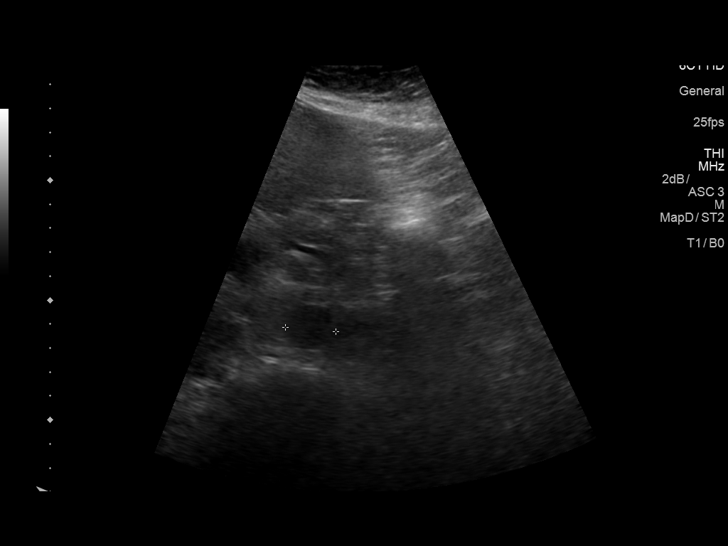
[im 11/23]
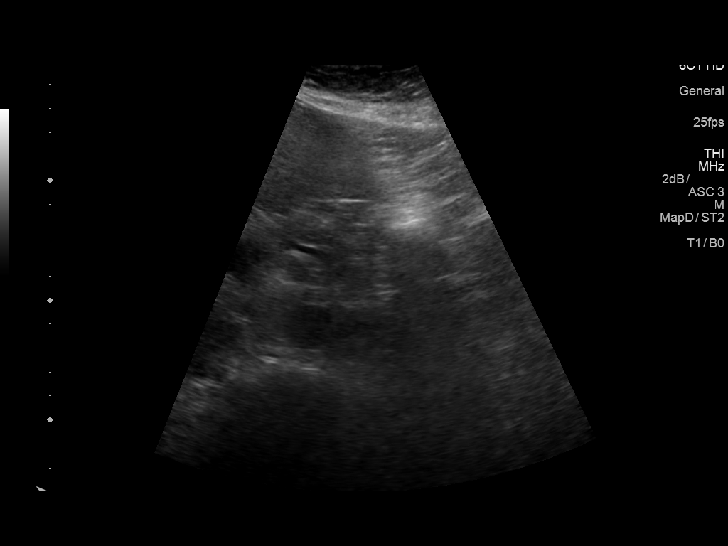
[im 13/23]
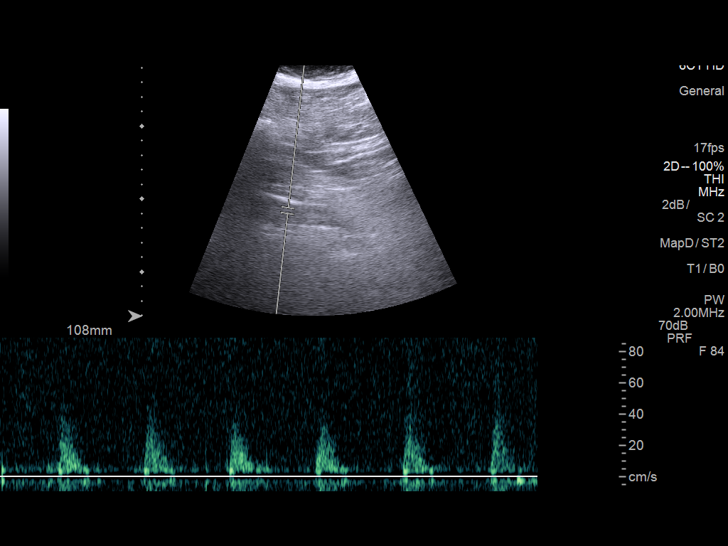
[im 14/23]
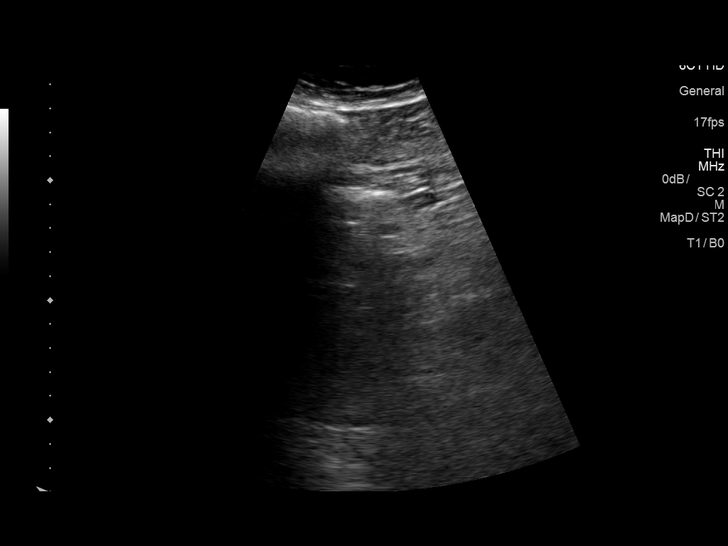
[im 16/23]
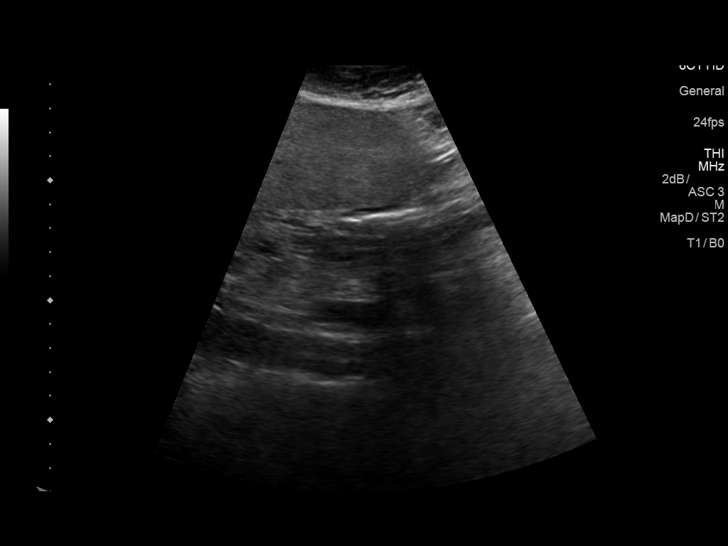
[im 18/23]
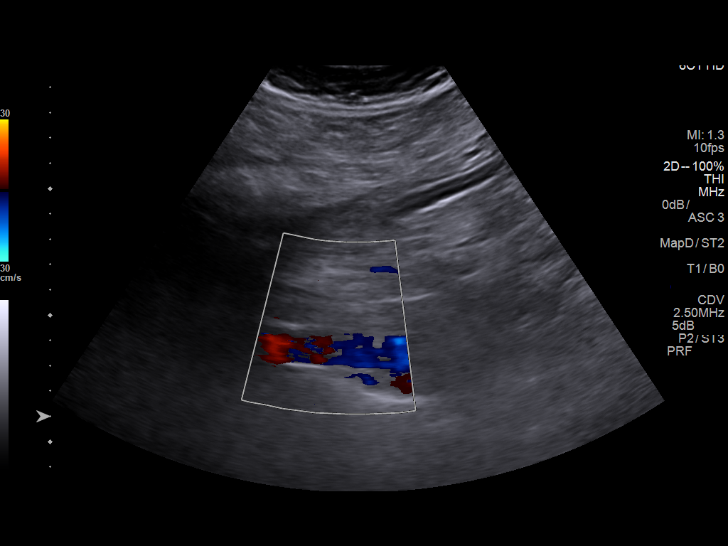
[im 19/23]
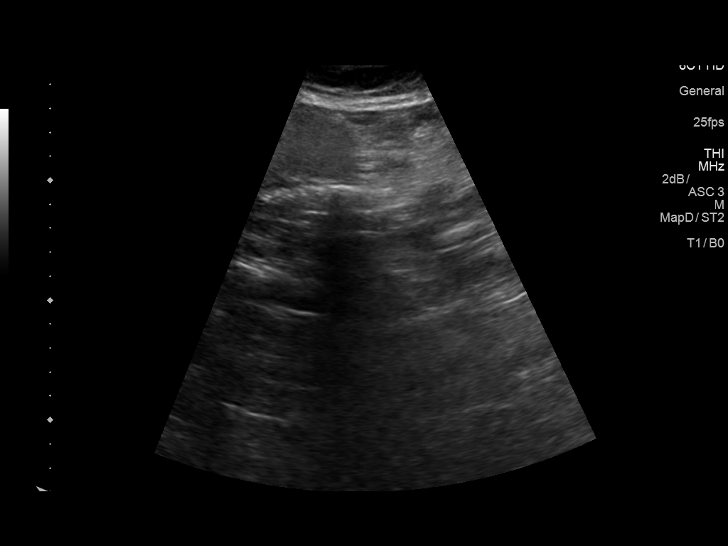
[im 21/23]
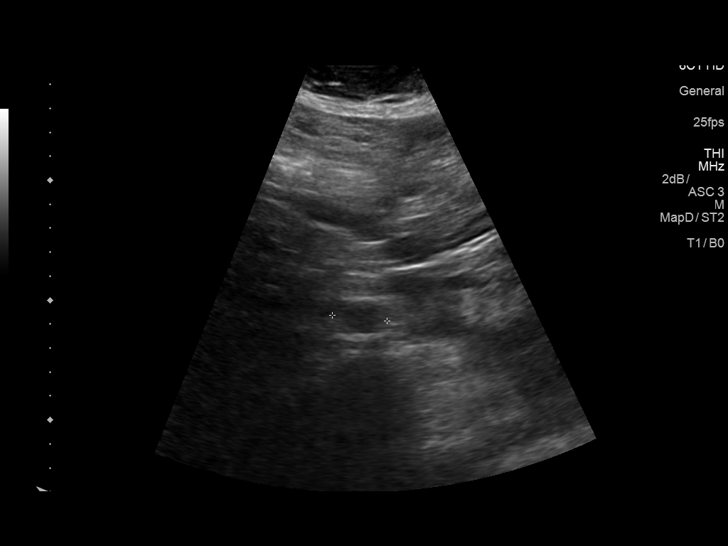
[im 23/23]
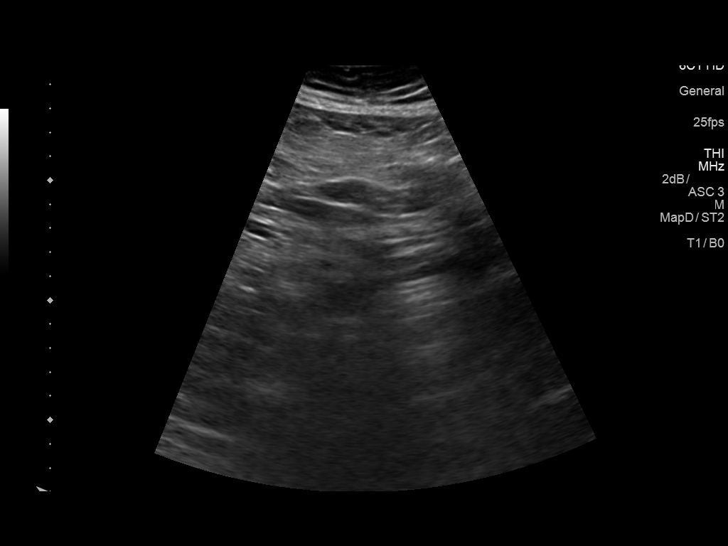

[14 of 23 positions shown; findings below may reference images not displayed]

FINDINGS: The examination was technically challenging due to bowel gas and
patient body habitus. The common iliac arteries were not visualized.

Abdominal aortic measurements as follows:

Proximal:  2.7 cm

Mid:  2.4 cm

Distal:  2.1 cm
IMPRESSION: Ectasia of the proximal abdominal aorta without significant interval
change.

## 2019-08-22 ENCOUNTER — Other Ambulatory Visit: Payer: Self-pay | Admitting: Internal Medicine

## 2019-08-22 DIAGNOSIS — Z1231 Encounter for screening mammogram for malignant neoplasm of breast: Secondary | ICD-10-CM

## 2019-08-24 ENCOUNTER — Ambulatory Visit: Payer: Medicare Other | Attending: Internal Medicine

## 2019-08-24 ENCOUNTER — Ambulatory Visit: Payer: Medicare Other

## 2019-08-24 DIAGNOSIS — Z23 Encounter for immunization: Secondary | ICD-10-CM | POA: Insufficient documentation

## 2019-08-24 NOTE — Progress Notes (Signed)
   Covid-19 Vaccination Clinic  Name:  Dawn Bradley    MRN: [id number] DOB: 23-Apr-1941  08/24/2019  [name] was observed post Covid-19 immunization for 15 minutes without incidence. She was provided with Vaccine Information Sheet and instruction to access the V-Safe system.   [name] was instructed to call 911 with any severe reactions post vaccine: Marland Kitchen Difficulty breathing  . Swelling of your face and throat  . A fast heartbeat  . A bad rash all over your body  . Dizziness and weakness    Immunizations Administered    Name Date Dose VIS Date Route   Pfizer COVID-19 Vaccine 08/24/2019 10:06 AM 0.3 mL 07/01/2019 Intramuscular   Manufacturer: [place]   Lot: [id number]   [place]: [id number]

## 2019-09-28 ENCOUNTER — Ambulatory Visit: Payer: Medicare Other

## 2019-10-05 ENCOUNTER — Ambulatory Visit: Payer: Medicare Other

## 2019-11-02 ENCOUNTER — Ambulatory Visit
Admission: RE | Admit: 2019-11-02 | Discharge: 2019-11-02 | Disposition: A | Discharge status: Home / Self Care | Payer: Medicare Other | Source: Ambulatory Visit | Attending: Internal Medicine | Admitting: Internal Medicine

## 2019-11-02 ENCOUNTER — Other Ambulatory Visit: Payer: Self-pay

## 2019-11-02 DIAGNOSIS — E1169 Type 2 diabetes mellitus with other specified complication: Secondary | ICD-10-CM | POA: Diagnosis not present

## 2019-11-02 DIAGNOSIS — E669 Obesity, unspecified: Secondary | ICD-10-CM | POA: Diagnosis not present

## 2019-11-02 DIAGNOSIS — Z1231 Encounter for screening mammogram for malignant neoplasm of breast: Secondary | ICD-10-CM

## 2019-11-02 DIAGNOSIS — G4700 Insomnia, unspecified: Secondary | ICD-10-CM | POA: Diagnosis not present

## 2019-11-02 DIAGNOSIS — I10 Essential (primary) hypertension: Secondary | ICD-10-CM | POA: Diagnosis not present

## 2019-11-03 DIAGNOSIS — H2513 Age-related nuclear cataract, bilateral: Secondary | ICD-10-CM | POA: Diagnosis not present

## 2019-11-17 DIAGNOSIS — E119 Type 2 diabetes mellitus without complications: Secondary | ICD-10-CM | POA: Diagnosis not present

## 2019-11-17 DIAGNOSIS — H25013 Cortical age-related cataract, bilateral: Secondary | ICD-10-CM | POA: Diagnosis not present

## 2019-11-17 DIAGNOSIS — H524 Presbyopia: Secondary | ICD-10-CM | POA: Diagnosis not present

## 2019-11-17 DIAGNOSIS — H2513 Age-related nuclear cataract, bilateral: Secondary | ICD-10-CM | POA: Diagnosis not present

## 2019-11-29 DIAGNOSIS — E1169 Type 2 diabetes mellitus with other specified complication: Secondary | ICD-10-CM | POA: Diagnosis not present

## 2019-12-29 DIAGNOSIS — Z905 Acquired absence of kidney: Secondary | ICD-10-CM | POA: Diagnosis not present

## 2019-12-29 DIAGNOSIS — Z8554 Personal history of malignant neoplasm of ureter: Secondary | ICD-10-CM | POA: Diagnosis not present

## 2020-03-07 DIAGNOSIS — Z0000 Encounter for general adult medical examination without abnormal findings: Secondary | ICD-10-CM | POA: Diagnosis not present

## 2020-03-07 DIAGNOSIS — C50919 Malignant neoplasm of unspecified site of unspecified female breast: Secondary | ICD-10-CM | POA: Diagnosis not present

## 2020-03-07 DIAGNOSIS — E669 Obesity, unspecified: Secondary | ICD-10-CM | POA: Diagnosis not present

## 2020-03-07 DIAGNOSIS — E039 Hypothyroidism, unspecified: Secondary | ICD-10-CM | POA: Diagnosis not present

## 2020-03-07 DIAGNOSIS — E559 Vitamin D deficiency, unspecified: Secondary | ICD-10-CM | POA: Diagnosis not present

## 2020-03-07 DIAGNOSIS — E1169 Type 2 diabetes mellitus with other specified complication: Secondary | ICD-10-CM | POA: Diagnosis not present

## 2020-03-07 DIAGNOSIS — G4700 Insomnia, unspecified: Secondary | ICD-10-CM | POA: Diagnosis not present

## 2020-03-07 DIAGNOSIS — I10 Essential (primary) hypertension: Secondary | ICD-10-CM | POA: Diagnosis not present

## 2020-03-07 DIAGNOSIS — E785 Hyperlipidemia, unspecified: Secondary | ICD-10-CM | POA: Diagnosis not present

## 2020-03-13 ENCOUNTER — Other Ambulatory Visit: Payer: Self-pay | Admitting: Internal Medicine

## 2020-03-13 ENCOUNTER — Ambulatory Visit
Admission: RE | Admit: 2020-03-13 | Discharge: 2020-03-13 | Disposition: A | Discharge status: Home / Self Care | Payer: Medicare Other | Source: Ambulatory Visit | Attending: Internal Medicine | Admitting: Internal Medicine

## 2020-03-13 DIAGNOSIS — I788 Other diseases of capillaries: Secondary | ICD-10-CM | POA: Diagnosis not present

## 2020-03-13 DIAGNOSIS — L304 Erythema intertrigo: Secondary | ICD-10-CM | POA: Diagnosis not present

## 2020-03-13 DIAGNOSIS — D2272 Melanocytic nevi of left lower limb, including hip: Secondary | ICD-10-CM | POA: Diagnosis not present

## 2020-03-13 DIAGNOSIS — I700 Atherosclerosis of aorta: Secondary | ICD-10-CM | POA: Diagnosis not present

## 2020-03-13 DIAGNOSIS — I259 Chronic ischemic heart disease, unspecified: Secondary | ICD-10-CM

## 2020-03-13 DIAGNOSIS — N644 Mastodynia: Secondary | ICD-10-CM | POA: Diagnosis not present

## 2020-03-13 DIAGNOSIS — L570 Actinic keratosis: Secondary | ICD-10-CM | POA: Diagnosis not present

## 2020-03-13 DIAGNOSIS — D2262 Melanocytic nevi of left upper limb, including shoulder: Secondary | ICD-10-CM | POA: Diagnosis not present

## 2020-03-13 DIAGNOSIS — D1801 Hemangioma of skin and subcutaneous tissue: Secondary | ICD-10-CM | POA: Diagnosis not present

## 2020-03-13 DIAGNOSIS — R079 Chest pain, unspecified: Secondary | ICD-10-CM

## 2020-03-13 DIAGNOSIS — C50919 Malignant neoplasm of unspecified site of unspecified female breast: Secondary | ICD-10-CM

## 2020-03-13 DIAGNOSIS — D225 Melanocytic nevi of trunk: Secondary | ICD-10-CM | POA: Diagnosis not present

## 2020-03-13 DIAGNOSIS — L821 Other seborrheic keratosis: Secondary | ICD-10-CM | POA: Diagnosis not present

## 2020-03-13 DIAGNOSIS — L720 Epidermal cyst: Secondary | ICD-10-CM | POA: Diagnosis not present

## 2020-03-13 DIAGNOSIS — J9811 Atelectasis: Secondary | ICD-10-CM | POA: Diagnosis not present

## 2020-03-13 DIAGNOSIS — D2271 Melanocytic nevi of right lower limb, including hip: Secondary | ICD-10-CM | POA: Diagnosis not present

## 2020-03-13 MED ORDER — IOPAMIDOL (ISOVUE-300) INJECTION 61%
75.0000 mL | Freq: Once | INTRAVENOUS | Status: AC | PRN
  Administered 2020-03-13: 75 mL via INTRAVENOUS

## 2020-03-21 DIAGNOSIS — R82998 Other abnormal findings in urine: Secondary | ICD-10-CM | POA: Diagnosis not present

## 2020-03-21 DIAGNOSIS — Z23 Encounter for immunization: Secondary | ICD-10-CM | POA: Diagnosis not present

## 2020-04-20 DIAGNOSIS — M2550 Pain in unspecified joint: Secondary | ICD-10-CM | POA: Diagnosis not present

## 2020-04-20 DIAGNOSIS — K529 Noninfective gastroenteritis and colitis, unspecified: Secondary | ICD-10-CM | POA: Diagnosis not present

## 2020-04-26 DIAGNOSIS — Z23 Encounter for immunization: Secondary | ICD-10-CM | POA: Diagnosis not present

## 2020-05-15 DIAGNOSIS — Z23 Encounter for immunization: Secondary | ICD-10-CM | POA: Diagnosis not present

## 2020-07-10 DIAGNOSIS — Z8554 Personal history of malignant neoplasm of ureter: Secondary | ICD-10-CM | POA: Diagnosis not present

## 2020-07-10 DIAGNOSIS — N281 Cyst of kidney, acquired: Secondary | ICD-10-CM | POA: Diagnosis not present

## 2020-07-10 DIAGNOSIS — Z905 Acquired absence of kidney: Secondary | ICD-10-CM | POA: Diagnosis not present

## 2020-07-18 DIAGNOSIS — R6883 Chills (without fever): Secondary | ICD-10-CM | POA: Diagnosis not present

## 2020-07-18 DIAGNOSIS — Z1152 Encounter for screening for COVID-19: Secondary | ICD-10-CM | POA: Diagnosis not present

## 2020-07-18 DIAGNOSIS — B001 Herpesviral vesicular dermatitis: Secondary | ICD-10-CM | POA: Diagnosis not present

## 2020-07-18 DIAGNOSIS — E1169 Type 2 diabetes mellitus with other specified complication: Secondary | ICD-10-CM | POA: Diagnosis not present

## 2020-09-19 DIAGNOSIS — E669 Obesity, unspecified: Secondary | ICD-10-CM | POA: Diagnosis not present

## 2020-09-19 DIAGNOSIS — N1330 Unspecified hydronephrosis: Secondary | ICD-10-CM | POA: Diagnosis not present

## 2020-09-19 DIAGNOSIS — Z1212 Encounter for screening for malignant neoplasm of rectum: Secondary | ICD-10-CM | POA: Diagnosis not present

## 2020-09-19 DIAGNOSIS — I129 Hypertensive chronic kidney disease with stage 1 through stage 4 chronic kidney disease, or unspecified chronic kidney disease: Secondary | ICD-10-CM | POA: Diagnosis not present

## 2020-09-19 DIAGNOSIS — E1169 Type 2 diabetes mellitus with other specified complication: Secondary | ICD-10-CM | POA: Diagnosis not present

## 2020-10-01 ENCOUNTER — Other Ambulatory Visit: Payer: Self-pay | Admitting: Internal Medicine

## 2020-10-01 DIAGNOSIS — Z1231 Encounter for screening mammogram for malignant neoplasm of breast: Secondary | ICD-10-CM

## 2020-10-02 DIAGNOSIS — L853 Xerosis cutis: Secondary | ICD-10-CM | POA: Diagnosis not present

## 2020-10-02 DIAGNOSIS — Z419 Encounter for procedure for purposes other than remedying health state, unspecified: Secondary | ICD-10-CM | POA: Diagnosis not present

## 2020-10-02 DIAGNOSIS — L821 Other seborrheic keratosis: Secondary | ICD-10-CM | POA: Diagnosis not present

## 2020-10-02 DIAGNOSIS — D1801 Hemangioma of skin and subcutaneous tissue: Secondary | ICD-10-CM | POA: Diagnosis not present

## 2020-10-02 DIAGNOSIS — L570 Actinic keratosis: Secondary | ICD-10-CM | POA: Diagnosis not present

## 2020-11-24 DIAGNOSIS — Z23 Encounter for immunization: Secondary | ICD-10-CM | POA: Diagnosis not present

## 2020-11-27 DIAGNOSIS — E119 Type 2 diabetes mellitus without complications: Secondary | ICD-10-CM | POA: Diagnosis not present

## 2020-11-27 DIAGNOSIS — H5203 Hypermetropia, bilateral: Secondary | ICD-10-CM | POA: Diagnosis not present

## 2020-11-27 DIAGNOSIS — H2513 Age-related nuclear cataract, bilateral: Secondary | ICD-10-CM | POA: Diagnosis not present

## 2020-11-27 DIAGNOSIS — H52203 Unspecified astigmatism, bilateral: Secondary | ICD-10-CM | POA: Diagnosis not present

## 2020-11-28 ENCOUNTER — Other Ambulatory Visit: Payer: Self-pay

## 2020-11-28 ENCOUNTER — Ambulatory Visit
Admission: RE | Admit: 2020-11-28 | Discharge: 2020-11-28 | Disposition: A | Discharge status: Home / Self Care | Payer: Medicare Other | Source: Ambulatory Visit | Attending: Internal Medicine | Admitting: Internal Medicine

## 2020-11-28 DIAGNOSIS — Z1231 Encounter for screening mammogram for malignant neoplasm of breast: Secondary | ICD-10-CM | POA: Diagnosis not present

## 2020-12-05 DIAGNOSIS — Z23 Encounter for immunization: Secondary | ICD-10-CM | POA: Diagnosis not present

## 2021-03-01 DIAGNOSIS — H25012 Cortical age-related cataract, left eye: Secondary | ICD-10-CM | POA: Diagnosis not present

## 2021-03-01 DIAGNOSIS — H169 Unspecified keratitis: Secondary | ICD-10-CM | POA: Diagnosis not present

## 2021-03-01 DIAGNOSIS — H2513 Age-related nuclear cataract, bilateral: Secondary | ICD-10-CM | POA: Diagnosis not present

## 2021-03-18 DIAGNOSIS — E669 Obesity, unspecified: Secondary | ICD-10-CM | POA: Diagnosis not present

## 2021-03-18 DIAGNOSIS — E039 Hypothyroidism, unspecified: Secondary | ICD-10-CM | POA: Diagnosis not present

## 2021-03-18 DIAGNOSIS — E785 Hyperlipidemia, unspecified: Secondary | ICD-10-CM | POA: Diagnosis not present

## 2021-03-18 DIAGNOSIS — R82998 Other abnormal findings in urine: Secondary | ICD-10-CM | POA: Diagnosis not present

## 2021-03-18 DIAGNOSIS — Z1331 Encounter for screening for depression: Secondary | ICD-10-CM | POA: Diagnosis not present

## 2021-03-18 DIAGNOSIS — M1990 Unspecified osteoarthritis, unspecified site: Secondary | ICD-10-CM | POA: Diagnosis not present

## 2021-03-18 DIAGNOSIS — E559 Vitamin D deficiency, unspecified: Secondary | ICD-10-CM | POA: Diagnosis not present

## 2021-03-18 DIAGNOSIS — I10 Essential (primary) hypertension: Secondary | ICD-10-CM | POA: Diagnosis not present

## 2021-03-18 DIAGNOSIS — I700 Atherosclerosis of aorta: Secondary | ICD-10-CM | POA: Diagnosis not present

## 2021-03-18 DIAGNOSIS — Z1339 Encounter for screening examination for other mental health and behavioral disorders: Secondary | ICD-10-CM | POA: Diagnosis not present

## 2021-03-18 DIAGNOSIS — C50919 Malignant neoplasm of unspecified site of unspecified female breast: Secondary | ICD-10-CM | POA: Diagnosis not present

## 2021-03-18 DIAGNOSIS — E1169 Type 2 diabetes mellitus with other specified complication: Secondary | ICD-10-CM | POA: Diagnosis not present

## 2021-03-18 DIAGNOSIS — Z0000 Encounter for general adult medical examination without abnormal findings: Secondary | ICD-10-CM | POA: Diagnosis not present

## 2021-04-17 DIAGNOSIS — D2272 Melanocytic nevi of left lower limb, including hip: Secondary | ICD-10-CM | POA: Diagnosis not present

## 2021-04-17 DIAGNOSIS — D2262 Melanocytic nevi of left upper limb, including shoulder: Secondary | ICD-10-CM | POA: Diagnosis not present

## 2021-04-17 DIAGNOSIS — D225 Melanocytic nevi of trunk: Secondary | ICD-10-CM | POA: Diagnosis not present

## 2021-04-17 DIAGNOSIS — D1801 Hemangioma of skin and subcutaneous tissue: Secondary | ICD-10-CM | POA: Diagnosis not present

## 2021-04-17 DIAGNOSIS — L820 Inflamed seborrheic keratosis: Secondary | ICD-10-CM | POA: Diagnosis not present

## 2021-04-17 DIAGNOSIS — Z419 Encounter for procedure for purposes other than remedying health state, unspecified: Secondary | ICD-10-CM | POA: Diagnosis not present

## 2021-04-17 DIAGNOSIS — L720 Epidermal cyst: Secondary | ICD-10-CM | POA: Diagnosis not present

## 2021-04-17 DIAGNOSIS — D485 Neoplasm of uncertain behavior of skin: Secondary | ICD-10-CM | POA: Diagnosis not present

## 2021-04-27 DIAGNOSIS — Z23 Encounter for immunization: Secondary | ICD-10-CM | POA: Diagnosis not present

## 2021-07-23 DIAGNOSIS — N281 Cyst of kidney, acquired: Secondary | ICD-10-CM | POA: Diagnosis not present

## 2021-07-23 DIAGNOSIS — R82998 Other abnormal findings in urine: Secondary | ICD-10-CM | POA: Diagnosis not present

## 2021-07-23 DIAGNOSIS — Z8554 Personal history of malignant neoplasm of ureter: Secondary | ICD-10-CM | POA: Diagnosis not present

## 2021-07-23 DIAGNOSIS — Z9889 Other specified postprocedural states: Secondary | ICD-10-CM | POA: Diagnosis not present

## 2021-07-23 DIAGNOSIS — Z905 Acquired absence of kidney: Secondary | ICD-10-CM | POA: Diagnosis not present

## 2021-07-23 DIAGNOSIS — C689 Malignant neoplasm of urinary organ, unspecified: Secondary | ICD-10-CM | POA: Diagnosis not present

## 2021-07-23 DIAGNOSIS — N1339 Other hydronephrosis: Secondary | ICD-10-CM | POA: Diagnosis not present

## 2021-08-20 DIAGNOSIS — Z419 Encounter for procedure for purposes other than remedying health state, unspecified: Secondary | ICD-10-CM | POA: Diagnosis not present

## 2021-08-20 DIAGNOSIS — L570 Actinic keratosis: Secondary | ICD-10-CM | POA: Diagnosis not present

## 2021-09-18 DIAGNOSIS — I10 Essential (primary) hypertension: Secondary | ICD-10-CM | POA: Diagnosis not present

## 2021-09-18 DIAGNOSIS — C50919 Malignant neoplasm of unspecified site of unspecified female breast: Secondary | ICD-10-CM | POA: Diagnosis not present

## 2021-09-18 DIAGNOSIS — E559 Vitamin D deficiency, unspecified: Secondary | ICD-10-CM | POA: Diagnosis not present

## 2021-09-18 DIAGNOSIS — E669 Obesity, unspecified: Secondary | ICD-10-CM | POA: Diagnosis not present

## 2021-09-18 DIAGNOSIS — I700 Atherosclerosis of aorta: Secondary | ICD-10-CM | POA: Diagnosis not present

## 2021-09-18 DIAGNOSIS — E1169 Type 2 diabetes mellitus with other specified complication: Secondary | ICD-10-CM | POA: Diagnosis not present

## 2021-11-01 ENCOUNTER — Other Ambulatory Visit: Payer: Self-pay | Admitting: Internal Medicine

## 2021-11-01 DIAGNOSIS — Z1231 Encounter for screening mammogram for malignant neoplasm of breast: Secondary | ICD-10-CM

## 2021-12-05 DIAGNOSIS — E119 Type 2 diabetes mellitus without complications: Secondary | ICD-10-CM | POA: Diagnosis not present

## 2021-12-05 DIAGNOSIS — H2513 Age-related nuclear cataract, bilateral: Secondary | ICD-10-CM | POA: Diagnosis not present

## 2021-12-05 DIAGNOSIS — H5203 Hypermetropia, bilateral: Secondary | ICD-10-CM | POA: Diagnosis not present

## 2022-01-14 DIAGNOSIS — H00015 Hordeolum externum left lower eyelid: Secondary | ICD-10-CM | POA: Diagnosis not present

## 2022-02-19 ENCOUNTER — Ambulatory Visit
Admission: RE | Admit: 2022-02-19 | Discharge: 2022-02-19 | Disposition: A | Discharge status: Home / Self Care | Payer: Medicare Other | Source: Ambulatory Visit | Attending: Internal Medicine | Admitting: Internal Medicine

## 2022-02-19 DIAGNOSIS — E559 Vitamin D deficiency, unspecified: Secondary | ICD-10-CM | POA: Diagnosis not present

## 2022-02-19 DIAGNOSIS — I700 Atherosclerosis of aorta: Secondary | ICD-10-CM | POA: Diagnosis not present

## 2022-02-19 DIAGNOSIS — M5430 Sciatica, unspecified side: Secondary | ICD-10-CM | POA: Diagnosis not present

## 2022-02-19 DIAGNOSIS — Z1231 Encounter for screening mammogram for malignant neoplasm of breast: Secondary | ICD-10-CM

## 2022-02-19 DIAGNOSIS — E1169 Type 2 diabetes mellitus with other specified complication: Secondary | ICD-10-CM | POA: Diagnosis not present

## 2022-02-19 DIAGNOSIS — I10 Essential (primary) hypertension: Secondary | ICD-10-CM | POA: Diagnosis not present

## 2022-02-19 DIAGNOSIS — C50919 Malignant neoplasm of unspecified site of unspecified female breast: Secondary | ICD-10-CM | POA: Diagnosis not present

## 2022-02-19 DIAGNOSIS — E669 Obesity, unspecified: Secondary | ICD-10-CM | POA: Diagnosis not present

## 2022-03-11 ENCOUNTER — Other Ambulatory Visit: Payer: Self-pay | Admitting: Internal Medicine

## 2022-03-11 DIAGNOSIS — M5430 Sciatica, unspecified side: Secondary | ICD-10-CM

## 2022-03-11 DIAGNOSIS — M5416 Radiculopathy, lumbar region: Secondary | ICD-10-CM

## 2022-03-12 ENCOUNTER — Ambulatory Visit
Admission: RE | Admit: 2022-03-12 | Discharge: 2022-03-12 | Disposition: A | Discharge status: Home / Self Care | Payer: Medicare Other | Source: Ambulatory Visit | Attending: Internal Medicine | Admitting: Internal Medicine

## 2022-03-12 DIAGNOSIS — M4699 Unspecified inflammatory spondylopathy, multiple sites in spine: Secondary | ICD-10-CM | POA: Diagnosis not present

## 2022-03-12 DIAGNOSIS — M5126 Other intervertebral disc displacement, lumbar region: Secondary | ICD-10-CM | POA: Diagnosis not present

## 2022-03-12 DIAGNOSIS — M5430 Sciatica, unspecified side: Secondary | ICD-10-CM

## 2022-03-12 DIAGNOSIS — M48061 Spinal stenosis, lumbar region without neurogenic claudication: Secondary | ICD-10-CM | POA: Diagnosis not present

## 2022-03-12 DIAGNOSIS — M5416 Radiculopathy, lumbar region: Secondary | ICD-10-CM

## 2022-03-12 DIAGNOSIS — M4316 Spondylolisthesis, lumbar region: Secondary | ICD-10-CM | POA: Diagnosis not present

## 2022-03-12 MED ORDER — GADOBENATE DIMEGLUMINE 529 MG/ML IV SOLN
20.0000 mL | Freq: Once | INTRAVENOUS | Status: AC | PRN
  Administered 2022-03-12: 20 mL via INTRAVENOUS

## 2022-03-24 DIAGNOSIS — S30860A Insect bite (nonvenomous) of lower back and pelvis, initial encounter: Secondary | ICD-10-CM | POA: Diagnosis not present

## 2022-03-24 DIAGNOSIS — T07XXXA Unspecified multiple injuries, initial encounter: Secondary | ICD-10-CM | POA: Diagnosis not present

## 2022-04-04 DIAGNOSIS — M25551 Pain in right hip: Secondary | ICD-10-CM | POA: Diagnosis not present

## 2022-04-07 DIAGNOSIS — M4316 Spondylolisthesis, lumbar region: Secondary | ICD-10-CM | POA: Diagnosis not present

## 2022-04-07 DIAGNOSIS — Z6837 Body mass index (BMI) 37.0-37.9, adult: Secondary | ICD-10-CM | POA: Diagnosis not present

## 2022-04-10 DIAGNOSIS — M5416 Radiculopathy, lumbar region: Secondary | ICD-10-CM | POA: Diagnosis not present

## 2022-04-10 DIAGNOSIS — M5116 Intervertebral disc disorders with radiculopathy, lumbar region: Secondary | ICD-10-CM | POA: Diagnosis not present

## 2022-04-16 DIAGNOSIS — R82998 Other abnormal findings in urine: Secondary | ICD-10-CM | POA: Diagnosis not present

## 2022-04-16 DIAGNOSIS — Z23 Encounter for immunization: Secondary | ICD-10-CM | POA: Diagnosis not present

## 2022-04-16 DIAGNOSIS — E785 Hyperlipidemia, unspecified: Secondary | ICD-10-CM | POA: Diagnosis not present

## 2022-04-16 DIAGNOSIS — Z1331 Encounter for screening for depression: Secondary | ICD-10-CM | POA: Diagnosis not present

## 2022-04-16 DIAGNOSIS — E559 Vitamin D deficiency, unspecified: Secondary | ICD-10-CM | POA: Diagnosis not present

## 2022-04-16 DIAGNOSIS — Z1339 Encounter for screening examination for other mental health and behavioral disorders: Secondary | ICD-10-CM | POA: Diagnosis not present

## 2022-04-16 DIAGNOSIS — I700 Atherosclerosis of aorta: Secondary | ICD-10-CM | POA: Diagnosis not present

## 2022-04-16 DIAGNOSIS — E669 Obesity, unspecified: Secondary | ICD-10-CM | POA: Diagnosis not present

## 2022-04-16 DIAGNOSIS — E1169 Type 2 diabetes mellitus with other specified complication: Secondary | ICD-10-CM | POA: Diagnosis not present

## 2022-04-16 DIAGNOSIS — R7989 Other specified abnormal findings of blood chemistry: Secondary | ICD-10-CM | POA: Diagnosis not present

## 2022-04-16 DIAGNOSIS — Z0000 Encounter for general adult medical examination without abnormal findings: Secondary | ICD-10-CM | POA: Diagnosis not present

## 2022-04-16 DIAGNOSIS — I10 Essential (primary) hypertension: Secondary | ICD-10-CM | POA: Diagnosis not present

## 2022-04-16 DIAGNOSIS — M48061 Spinal stenosis, lumbar region without neurogenic claudication: Secondary | ICD-10-CM | POA: Diagnosis not present

## 2022-04-16 DIAGNOSIS — E039 Hypothyroidism, unspecified: Secondary | ICD-10-CM | POA: Diagnosis not present

## 2022-04-22 DIAGNOSIS — M545 Low back pain, unspecified: Secondary | ICD-10-CM | POA: Diagnosis not present

## 2022-04-22 DIAGNOSIS — M4316 Spondylolisthesis, lumbar region: Secondary | ICD-10-CM | POA: Diagnosis not present

## 2022-04-23 DIAGNOSIS — Z23 Encounter for immunization: Secondary | ICD-10-CM | POA: Diagnosis not present

## 2022-04-30 DIAGNOSIS — M4316 Spondylolisthesis, lumbar region: Secondary | ICD-10-CM | POA: Diagnosis not present

## 2022-04-30 DIAGNOSIS — M545 Low back pain, unspecified: Secondary | ICD-10-CM | POA: Diagnosis not present

## 2022-05-01 ENCOUNTER — Other Ambulatory Visit (HOSPITAL_COMMUNITY): Payer: Self-pay | Admitting: Internal Medicine

## 2022-05-01 ENCOUNTER — Other Ambulatory Visit: Payer: Self-pay | Admitting: Internal Medicine

## 2022-05-01 ENCOUNTER — Encounter: Payer: Self-pay | Admitting: Radiology

## 2022-05-01 DIAGNOSIS — R0789 Other chest pain: Secondary | ICD-10-CM | POA: Diagnosis not present

## 2022-05-01 DIAGNOSIS — R2243 Localized swelling, mass and lump, lower limb, bilateral: Secondary | ICD-10-CM

## 2022-05-01 DIAGNOSIS — M48061 Spinal stenosis, lumbar region without neurogenic claudication: Secondary | ICD-10-CM | POA: Diagnosis not present

## 2022-05-01 DIAGNOSIS — I10 Essential (primary) hypertension: Secondary | ICD-10-CM | POA: Diagnosis not present

## 2022-05-01 DIAGNOSIS — R0600 Dyspnea, unspecified: Secondary | ICD-10-CM

## 2022-05-01 DIAGNOSIS — R079 Chest pain, unspecified: Secondary | ICD-10-CM

## 2022-05-01 DIAGNOSIS — R0609 Other forms of dyspnea: Secondary | ICD-10-CM | POA: Diagnosis not present

## 2022-05-01 DIAGNOSIS — M7989 Other specified soft tissue disorders: Secondary | ICD-10-CM | POA: Diagnosis not present

## 2022-05-02 ENCOUNTER — Ambulatory Visit (HOSPITAL_COMMUNITY)
Admission: RE | Admit: 2022-05-02 | Discharge: 2022-05-02 | Disposition: A | Discharge status: Home / Self Care | Payer: Medicare Other | Source: Ambulatory Visit | Attending: Internal Medicine | Admitting: Internal Medicine

## 2022-05-02 DIAGNOSIS — R2243 Localized swelling, mass and lump, lower limb, bilateral: Secondary | ICD-10-CM | POA: Diagnosis not present

## 2022-05-02 NOTE — Progress Notes (Signed)
Bilateral lower extremity venous duplex has been completed. Preliminary results can be found in CV Proc through chart review.  Results were faxed to Dr. Reynaldo Minium.  05/02/22 9:09 AM Carlos Levering RVT

## 2022-05-05 ENCOUNTER — Ambulatory Visit (HOSPITAL_COMMUNITY)
Admission: RE | Admit: 2022-05-05 | Discharge: 2022-05-05 | Disposition: A | Discharge status: Home / Self Care | Payer: Medicare Other | Source: Ambulatory Visit | Attending: Internal Medicine | Admitting: Internal Medicine

## 2022-05-05 ENCOUNTER — Encounter (HOSPITAL_COMMUNITY)
Admission: RE | Admit: 2022-05-05 | Discharge: 2022-05-05 | Disposition: A | Discharge status: Home / Self Care | Payer: Medicare Other | Source: Ambulatory Visit | Attending: Internal Medicine | Admitting: Internal Medicine

## 2022-05-05 DIAGNOSIS — R079 Chest pain, unspecified: Secondary | ICD-10-CM | POA: Insufficient documentation

## 2022-05-05 DIAGNOSIS — R0600 Dyspnea, unspecified: Secondary | ICD-10-CM

## 2022-05-05 MED ORDER — TECHNETIUM TO 99M ALBUMIN AGGREGATED
4.1000 | Freq: Once | INTRAVENOUS | Status: AC | PRN
  Administered 2022-05-05: 4.1 via INTRAVENOUS

## 2022-05-13 DIAGNOSIS — M4316 Spondylolisthesis, lumbar region: Secondary | ICD-10-CM | POA: Diagnosis not present

## 2022-05-13 DIAGNOSIS — M545 Low back pain, unspecified: Secondary | ICD-10-CM | POA: Diagnosis not present

## 2022-05-14 DIAGNOSIS — Z6836 Body mass index (BMI) 36.0-36.9, adult: Secondary | ICD-10-CM | POA: Diagnosis not present

## 2022-05-14 DIAGNOSIS — M4316 Spondylolisthesis, lumbar region: Secondary | ICD-10-CM | POA: Diagnosis not present

## 2022-05-30 ENCOUNTER — Ambulatory Visit: Payer: Medicare Other | Attending: Interventional Cardiology | Admitting: Interventional Cardiology

## 2022-05-30 VITALS — BP 132/64 | HR 70 | Ht 64.5 in | Wt 214.0 lb

## 2022-05-30 DIAGNOSIS — I700 Atherosclerosis of aorta: Secondary | ICD-10-CM | POA: Insufficient documentation

## 2022-05-30 DIAGNOSIS — R0609 Other forms of dyspnea: Secondary | ICD-10-CM | POA: Insufficient documentation

## 2022-05-30 DIAGNOSIS — N189 Chronic kidney disease, unspecified: Secondary | ICD-10-CM | POA: Diagnosis not present

## 2022-05-30 DIAGNOSIS — I10 Essential (primary) hypertension: Secondary | ICD-10-CM | POA: Diagnosis not present

## 2022-05-30 NOTE — Progress Notes (Signed)
Cardiology Office Note   Date:  05/30/2022   ID:  Itsel, Opfer [birth year], MRN [id number]  PCP:  Burnard Bunting, MD    No chief complaint on file.  DOE  [facility] Readings from Last 3 Encounters:  05/30/22 214 lb (97.1 kg)  11/04/16 223 lb 8 oz (101.4 kg)  11/06/15 219 lb (99.3 kg)       History of Present Illness: Dawn Bradley is a 81 y.o. female who is being seen today for the evaluation of chest discomfort and dyspnea on exertion at the request of Burnard Bunting, MD.   She has a history of aortic atherosclerosis, renal insufficiency and urologic malignancy. Had some lower extremity swelling in October 2023.  No evidence of lower extremity DVT by ultrasound in October 2023.  VQ scan negative for PE: "No evidence acute pulmonary embolism."  Chest CT in 2021 showed: "Cardiovascular: Atherosclerosis of thoracic aorta is noted without aneurysm or dissection. Normal cardiac size. No pericardial effusion." 2019 chest CT showed: " Normal heart size. No pericardial effusion. Aortic atherosclerosis. Slight coronary artery calcification."  She had a surgery to open spinal stenosis- in 9/23.  After the surgery, she had some DOE with walking up stairs.  Sx resolved.  She has been much more active.  Walking up stairs is much easier now. Walks in [facility].   Currently Denies : Chest pain. Dizziness. Leg edema. Nitroglycerin use. Orthopnea. Palpitations. Paroxysmal nocturnal dyspnea. Shortness of breath. Syncope.  Feels much better since 2 weeks passed after back surgery.   Past Medical History:  Diagnosis Date   Abnormal radiologic findings on diagnostic imaging of right kidney    Angina at rest    Atherosclerosis of aorta (H[facility])    Breast cancer ([place])    Cancer ([place])    lt breast   Chest discomfort    Diabetes mellitus    pre-diabetic   DOE (dyspnea on exertion)    GERD (gastroesophageal reflux disease)    Hyperlipidemia    Hypertension    Hypothyroid     Insomnia    Obesity    Osteoarthritis    Personal history of malignant neoplasm of breast    Personal history of radiation therapy    Spinal stenosis of lumbar region, unspecified whether neurogenic claudication present    Swelling of lower extremity    Vitamin D deficiency     Past Surgical History:  Procedure Laterality Date   ANTERIOR CRUCIATE LIGAMENT REPAIR  1960   APPENDECTOMY  1962   BREAST LUMPECTOMY  06/11/2011   Procedure: LUMPECTOMY;  Surgeon: Luella Cook III, MD;  Location: [facility];  Service: General;  Laterality: Left;  Left Needle localization lumpectomy   CHOLECYSTECTOMY  1971   JOINT REPLACEMENT     both knees 2/12   REPLACEMENT TOTAL KNEE BILATERAL  09/06/10   TUBAL LIGATION  1980     Current Outpatient Medications  Medication Sig Dispense Refill   acetaminophen (TYLENOL) 325 MG tablet Take 650 mg by mouth every 6 (six) hours as needed.     ALPRAZolam (XANAX) 0.25 MG tablet take 1 tablet Orally 65mns to 1 hr before MRI 30 days     glipiZIDE (GLUCOTROL) 10 MG tablet Take 5 mg by mouth daily before breakfast. Pt also takes 5 mg at night     Levothyroxine Sodium (SYNTHROID PO) Take 88 mcg by mouth daily.      lisinopril (PRINIVIL,ZESTRIL) 10 MG tablet Take 10 mg by  mouth 2 (two) times daily.     metformin (FORTAMET) 500 MG (OSM) 24 hr tablet Take 500 mg by mouth 2 (two) times daily with a meal.     simvastatin (ZOCOR) 10 MG tablet Take 20 mg by mouth at bedtime.      Vitamin D, Ergocalciferol, (DRISDOL) 50000 UNITS CAPS Take 50,000 Units by mouth. Pt takes on Saturdays.     zolpidem (AMBIEN) 5 MG tablet Take 5 mg by mouth at bedtime as needed.      amoxicillin (AMOXIL) 500 MG capsule TAKE 4 CAPSULES 1 HOUR PRIOR TO DENTAL PROCEDURE     letrozole (FEMARA) 2.5 MG tablet Take 1 tablet (2.5 mg total) by mouth daily. 90 tablet 3   valACYclovir (VALTREX) 1000 MG tablet Take 1,000 mg by mouth 2 (two) times daily as needed.     No current  facility-administered medications for this visit.    Allergies:   Morphine, Pholcodine, Shellfish allergy, Shellfish-derived products, Codeine, Latex, Other, and Tramadol    Social History:  The patient  reports that she has quit smoking. She does not have any smokeless tobacco history on file. She reports current alcohol use. She reports that she does not use drugs.   Family History:  The patient's family history includes Breast cancer in her sister; Cancer in her father and sister. No early CAD   ROS:  Please see the history of present illness.   Otherwise, review of systems are positive for improvement in .   All other systems are reviewed and negative.    PHYSICAL EXAM: VS:  BP 132/64   Pulse 70   Ht 5' 4.5" (1.638 m)   Wt 214 lb (97.1 kg)   SpO2 97%   BMI 36.17 kg/m  , BMI Body mass index is 36.17 kg/m. GEN: Well nourished, well developed, in no acute distress HEENT: normal Neck: no JVD, carotid bruits, or masses Cardiac: RRR; no murmurs, rubs, or gallops,no edema  Respiratory:  clear to auscultation bilaterally, normal work of breathing GI: soft, nontender, nondistended, + BS MS: no deformity or atrophy Skin: warm and dry, no rash Neuro:  Strength and sensation are intact Psych: euthymic mood, full affect   EKG:   The ekg ordered today demonstrates normal ECG   Recent Labs: No results found for requested labs within last 365 days.   Lipid Panel No results found for: "CHOL", "TRIG", "HDL", "CHOLHDL", "VLDL", "LDLCALC", "LDLDIRECT"   Other studies Reviewed: Additional studies/ records that were reviewed today with results demonstrating: Labs reviewed, creatinine 1.7 (has been as high as 2.0 in 2022) triglycerides 157, HDL 80, LDL 60 in September 2023.  In October 2023, hemoglobin 12.1.  TSH mildly decreased at 0.37.   ASSESSMENT AND PLAN:  Dyspnea on exertion/chest tightness: Minimal coronary calcification noted on prior chest CT.  Sx have improved.  We  discussed echo and stress test.  She declined given lack of symptoms.  She will let us know if any sx return and we can reconsider. Aortic atherosclerosis: She has been on low-dose statin. Hypertension: The current medical regimen is effective;  continue present plan and medications.  Home readings are typically in the 140/70s.  She is interested in trying to change her lisinopril to once a day dosing.  She will follow-up with Dr. Reynaldo Minium. Chronic renal insufficiency: s/p nephrectomy.  One functioning kidney.  This limits testing that would involve dye.  Would not do CTA of the coronaries.  Would try to avoid cardiac cath unless  she had a high risk noninvasive study.  Avoid nephrotoxins such as NSAIDs or PPIs.  Stay well-hydrated.   Current medicines are reviewed at length with the patient today.  The patient concerns regarding her medicines were addressed.  The following changes have been made:  No change  Labs/ tests ordered today include:  No orders of the defined types were placed in this encounter.   Recommend 150 minutes/week of aerobic exercise Low fat, low carb, high fiber diet recommended  Disposition:   FU as needed   Signed, Larae Grooms, MD  05/30/2022 1:53 PM    [facility] [address], [place], [place]  [address] Phone: [phone]; Fax: [phone]

## 2022-05-30 NOTE — Patient Instructions (Signed)
Medication Instructions:  Your physician recommends that you continue on your current medications as directed. Please refer to the Current Medication list given to you today.  *If you need a refill on your cardiac medications before your next appointment, please call your pharmacy*   Lab Work: none If you have labs (blood work) drawn today and your tests are completely normal, you will receive your results only by: [place] (if you have MyChart) OR A paper copy in the mail If you have any lab test that is abnormal or we need to change your treatment, we will call you to review the results.   Testing/Procedures: none   Follow-Up: At [facility], you and your health needs are our priority.  As part of our continuing mission to provide you with exceptional heart care, we have created designated Provider Care Teams.  These Care Teams include your primary Cardiologist (physician) and Advanced Practice Providers (APPs -  Physician Assistants and Nurse Practitioners) who all work together to provide you with the care you need, when you need it.  We recommend signing up for the patient portal called "MyChart".  Sign up information is provided on this After Visit Summary.  MyChart is used to connect with patients for Virtual Visits (Telemedicine).  Patients are able to view lab/test results, encounter notes, upcoming appointments, etc.  Non-urgent messages can be sent to your provider as well.   To learn more about what you can do with MyChart, go to [email or web address].    Your next appointment:   As needed  The format for your next appointment:   In Person  Provider:   Larae Grooms, MD     Other Instructions   Important Information About Sugar

## 2022-06-03 DIAGNOSIS — M545 Low back pain, unspecified: Secondary | ICD-10-CM | POA: Diagnosis not present

## 2022-06-03 DIAGNOSIS — M4316 Spondylolisthesis, lumbar region: Secondary | ICD-10-CM | POA: Diagnosis not present

## 2022-06-16 DIAGNOSIS — M4316 Spondylolisthesis, lumbar region: Secondary | ICD-10-CM | POA: Diagnosis not present

## 2022-06-16 DIAGNOSIS — M545 Low back pain, unspecified: Secondary | ICD-10-CM | POA: Diagnosis not present

## 2022-06-17 DIAGNOSIS — D1801 Hemangioma of skin and subcutaneous tissue: Secondary | ICD-10-CM | POA: Diagnosis not present

## 2022-06-17 DIAGNOSIS — D225 Melanocytic nevi of trunk: Secondary | ICD-10-CM | POA: Diagnosis not present

## 2022-06-17 DIAGNOSIS — L570 Actinic keratosis: Secondary | ICD-10-CM | POA: Diagnosis not present

## 2022-06-17 DIAGNOSIS — D2262 Melanocytic nevi of left upper limb, including shoulder: Secondary | ICD-10-CM | POA: Diagnosis not present

## 2022-06-17 DIAGNOSIS — L814 Other melanin hyperpigmentation: Secondary | ICD-10-CM | POA: Diagnosis not present

## 2022-06-17 DIAGNOSIS — L821 Other seborrheic keratosis: Secondary | ICD-10-CM | POA: Diagnosis not present

## 2022-06-17 DIAGNOSIS — D2271 Melanocytic nevi of right lower limb, including hip: Secondary | ICD-10-CM | POA: Diagnosis not present

## 2022-06-17 DIAGNOSIS — D2272 Melanocytic nevi of left lower limb, including hip: Secondary | ICD-10-CM | POA: Diagnosis not present

## 2022-06-17 DIAGNOSIS — D692 Other nonthrombocytopenic purpura: Secondary | ICD-10-CM | POA: Diagnosis not present

## 2022-06-17 DIAGNOSIS — D2261 Melanocytic nevi of right upper limb, including shoulder: Secondary | ICD-10-CM | POA: Diagnosis not present

## 2022-06-17 DIAGNOSIS — I10 Essential (primary) hypertension: Secondary | ICD-10-CM | POA: Diagnosis not present

## 2022-06-23 DIAGNOSIS — M4316 Spondylolisthesis, lumbar region: Secondary | ICD-10-CM | POA: Diagnosis not present

## 2022-06-23 DIAGNOSIS — M545 Low back pain, unspecified: Secondary | ICD-10-CM | POA: Diagnosis not present

## 2022-06-23 DIAGNOSIS — I10 Essential (primary) hypertension: Secondary | ICD-10-CM | POA: Diagnosis not present

## 2022-07-10 DIAGNOSIS — M545 Low back pain, unspecified: Secondary | ICD-10-CM | POA: Diagnosis not present

## 2022-07-10 DIAGNOSIS — M4316 Spondylolisthesis, lumbar region: Secondary | ICD-10-CM | POA: Diagnosis not present

## 2022-07-29 DIAGNOSIS — Z85528 Personal history of other malignant neoplasm of kidney: Secondary | ICD-10-CM | POA: Diagnosis not present

## 2022-07-29 DIAGNOSIS — N281 Cyst of kidney, acquired: Secondary | ICD-10-CM | POA: Diagnosis not present

## 2022-07-29 DIAGNOSIS — C689 Malignant neoplasm of urinary organ, unspecified: Secondary | ICD-10-CM | POA: Diagnosis not present

## 2022-07-29 DIAGNOSIS — I700 Atherosclerosis of aorta: Secondary | ICD-10-CM | POA: Diagnosis not present

## 2022-07-29 DIAGNOSIS — K5730 Diverticulosis of large intestine without perforation or abscess without bleeding: Secondary | ICD-10-CM | POA: Diagnosis not present

## 2022-07-29 DIAGNOSIS — Z905 Acquired absence of kidney: Secondary | ICD-10-CM | POA: Diagnosis not present

## 2022-07-30 DIAGNOSIS — M545 Low back pain, unspecified: Secondary | ICD-10-CM | POA: Diagnosis not present

## 2022-07-30 DIAGNOSIS — M4316 Spondylolisthesis, lumbar region: Secondary | ICD-10-CM | POA: Diagnosis not present

## 2022-08-14 DIAGNOSIS — M5416 Radiculopathy, lumbar region: Secondary | ICD-10-CM | POA: Diagnosis not present

## 2022-08-14 DIAGNOSIS — M5116 Intervertebral disc disorders with radiculopathy, lumbar region: Secondary | ICD-10-CM | POA: Diagnosis not present

## 2022-08-27 DIAGNOSIS — M545 Low back pain, unspecified: Secondary | ICD-10-CM | POA: Diagnosis not present

## 2022-08-27 DIAGNOSIS — M4316 Spondylolisthesis, lumbar region: Secondary | ICD-10-CM | POA: Diagnosis not present

## 2022-09-01 DIAGNOSIS — Z419 Encounter for procedure for purposes other than remedying health state, unspecified: Secondary | ICD-10-CM | POA: Diagnosis not present

## 2022-09-01 DIAGNOSIS — L820 Inflamed seborrheic keratosis: Secondary | ICD-10-CM | POA: Diagnosis not present

## 2022-09-03 DIAGNOSIS — M545 Low back pain, unspecified: Secondary | ICD-10-CM | POA: Diagnosis not present

## 2022-09-03 DIAGNOSIS — M4316 Spondylolisthesis, lumbar region: Secondary | ICD-10-CM | POA: Diagnosis not present

## 2022-09-08 DIAGNOSIS — A09 Infectious gastroenteritis and colitis, unspecified: Secondary | ICD-10-CM | POA: Diagnosis not present

## 2022-09-08 DIAGNOSIS — Z86010 Personal history of colonic polyps: Secondary | ICD-10-CM | POA: Diagnosis not present

## 2022-09-22 DIAGNOSIS — M545 Low back pain, unspecified: Secondary | ICD-10-CM | POA: Diagnosis not present

## 2022-09-22 DIAGNOSIS — M4316 Spondylolisthesis, lumbar region: Secondary | ICD-10-CM | POA: Diagnosis not present

## 2022-10-01 DIAGNOSIS — I10 Essential (primary) hypertension: Secondary | ICD-10-CM | POA: Diagnosis not present

## 2022-10-01 DIAGNOSIS — E669 Obesity, unspecified: Secondary | ICD-10-CM | POA: Diagnosis not present

## 2022-10-01 DIAGNOSIS — E1169 Type 2 diabetes mellitus with other specified complication: Secondary | ICD-10-CM | POA: Diagnosis not present

## 2022-10-02 DIAGNOSIS — E1169 Type 2 diabetes mellitus with other specified complication: Secondary | ICD-10-CM | POA: Diagnosis not present

## 2022-10-02 DIAGNOSIS — M545 Low back pain, unspecified: Secondary | ICD-10-CM | POA: Diagnosis not present

## 2022-10-02 DIAGNOSIS — I10 Essential (primary) hypertension: Secondary | ICD-10-CM | POA: Diagnosis not present

## 2022-10-02 DIAGNOSIS — E785 Hyperlipidemia, unspecified: Secondary | ICD-10-CM | POA: Diagnosis not present

## 2022-10-02 DIAGNOSIS — M4316 Spondylolisthesis, lumbar region: Secondary | ICD-10-CM | POA: Diagnosis not present

## 2022-10-02 DIAGNOSIS — E669 Obesity, unspecified: Secondary | ICD-10-CM | POA: Diagnosis not present

## 2022-10-09 DIAGNOSIS — M545 Low back pain, unspecified: Secondary | ICD-10-CM | POA: Diagnosis not present

## 2022-10-09 DIAGNOSIS — M4316 Spondylolisthesis, lumbar region: Secondary | ICD-10-CM | POA: Diagnosis not present

## 2022-10-13 DIAGNOSIS — M4316 Spondylolisthesis, lumbar region: Secondary | ICD-10-CM | POA: Diagnosis not present

## 2022-10-13 DIAGNOSIS — Z6835 Body mass index (BMI) 35.0-35.9, adult: Secondary | ICD-10-CM | POA: Diagnosis not present

## 2022-10-23 ENCOUNTER — Other Ambulatory Visit (HOSPITAL_BASED_OUTPATIENT_CLINIC_OR_DEPARTMENT_OTHER): Payer: Self-pay

## 2022-10-23 MED ORDER — MOUNJARO 5 MG/0.5ML ~~LOC~~ SOAJ
5.0000 mg | SUBCUTANEOUS | 3 refills | Status: DC
  Filled 2022-10-23: qty 2, 28d supply, fill #0
  Filled 2022-11-06: qty 2, 28d supply, fill #1
  Filled 2022-12-05: qty 2, 28d supply, fill #2

## 2022-10-27 DIAGNOSIS — M4316 Spondylolisthesis, lumbar region: Secondary | ICD-10-CM | POA: Diagnosis not present

## 2022-10-27 DIAGNOSIS — M545 Low back pain, unspecified: Secondary | ICD-10-CM | POA: Diagnosis not present

## 2022-10-30 DIAGNOSIS — M4316 Spondylolisthesis, lumbar region: Secondary | ICD-10-CM | POA: Diagnosis not present

## 2022-10-30 DIAGNOSIS — M545 Low back pain, unspecified: Secondary | ICD-10-CM | POA: Diagnosis not present

## 2022-11-03 DIAGNOSIS — M4316 Spondylolisthesis, lumbar region: Secondary | ICD-10-CM | POA: Diagnosis not present

## 2022-11-03 DIAGNOSIS — M545 Low back pain, unspecified: Secondary | ICD-10-CM | POA: Diagnosis not present

## 2022-11-04 ENCOUNTER — Other Ambulatory Visit (HOSPITAL_BASED_OUTPATIENT_CLINIC_OR_DEPARTMENT_OTHER): Payer: Self-pay

## 2022-11-11 ENCOUNTER — Other Ambulatory Visit (HOSPITAL_BASED_OUTPATIENT_CLINIC_OR_DEPARTMENT_OTHER): Payer: Self-pay

## 2022-11-11 ENCOUNTER — Other Ambulatory Visit: Payer: Self-pay

## 2022-11-11 ENCOUNTER — Other Ambulatory Visit (HOSPITAL_COMMUNITY): Payer: Self-pay

## 2022-11-11 MED ORDER — MOUNJARO 2.5 MG/0.5ML ~~LOC~~ SOAJ
SUBCUTANEOUS | 3 refills | Status: DC
  Filled 2022-11-11: qty 2, 28d supply, fill #0

## 2022-11-17 DIAGNOSIS — M5416 Radiculopathy, lumbar region: Secondary | ICD-10-CM | POA: Diagnosis not present

## 2022-11-17 DIAGNOSIS — M5116 Intervertebral disc disorders with radiculopathy, lumbar region: Secondary | ICD-10-CM | POA: Diagnosis not present

## 2022-11-20 ENCOUNTER — Other Ambulatory Visit (HOSPITAL_BASED_OUTPATIENT_CLINIC_OR_DEPARTMENT_OTHER): Payer: Self-pay

## 2022-11-21 ENCOUNTER — Other Ambulatory Visit (HOSPITAL_BASED_OUTPATIENT_CLINIC_OR_DEPARTMENT_OTHER): Payer: Self-pay

## 2022-12-02 DIAGNOSIS — E669 Obesity, unspecified: Secondary | ICD-10-CM | POA: Diagnosis not present

## 2022-12-02 DIAGNOSIS — E785 Hyperlipidemia, unspecified: Secondary | ICD-10-CM | POA: Diagnosis not present

## 2022-12-02 DIAGNOSIS — I10 Essential (primary) hypertension: Secondary | ICD-10-CM | POA: Diagnosis not present

## 2022-12-02 DIAGNOSIS — E1169 Type 2 diabetes mellitus with other specified complication: Secondary | ICD-10-CM | POA: Diagnosis not present

## 2022-12-03 ENCOUNTER — Emergency Department (HOSPITAL_COMMUNITY): Payer: Medicare Other

## 2022-12-03 ENCOUNTER — Inpatient Hospital Stay (HOSPITAL_COMMUNITY)
Admission: EM | Admit: 2022-12-03 | Discharge: 2022-12-08 | DRG: 552 | Disposition: A | Discharge status: Skilled Nursing Facility | Payer: Medicare Other | Attending: Internal Medicine | Admitting: Internal Medicine

## 2022-12-03 ENCOUNTER — Encounter (HOSPITAL_COMMUNITY): Payer: Self-pay

## 2022-12-03 ENCOUNTER — Other Ambulatory Visit: Payer: Self-pay

## 2022-12-03 DIAGNOSIS — Z7984 Long term (current) use of oral hypoglycemic drugs: Secondary | ICD-10-CM | POA: Diagnosis not present

## 2022-12-03 DIAGNOSIS — Z79811 Long term (current) use of aromatase inhibitors: Secondary | ICD-10-CM

## 2022-12-03 DIAGNOSIS — Z6834 Body mass index (BMI) 34.0-34.9, adult: Secondary | ICD-10-CM | POA: Diagnosis not present

## 2022-12-03 DIAGNOSIS — E785 Hyperlipidemia, unspecified: Secondary | ICD-10-CM | POA: Diagnosis present

## 2022-12-03 DIAGNOSIS — M5431 Sciatica, right side: Secondary | ICD-10-CM | POA: Diagnosis present

## 2022-12-03 DIAGNOSIS — N179 Acute kidney failure, unspecified: Secondary | ICD-10-CM | POA: Diagnosis not present

## 2022-12-03 DIAGNOSIS — R2689 Other abnormalities of gait and mobility: Secondary | ICD-10-CM | POA: Diagnosis not present

## 2022-12-03 DIAGNOSIS — E669 Obesity, unspecified: Secondary | ICD-10-CM | POA: Diagnosis present

## 2022-12-03 DIAGNOSIS — E875 Hyperkalemia: Secondary | ICD-10-CM | POA: Diagnosis present

## 2022-12-03 DIAGNOSIS — Z91040 Latex allergy status: Secondary | ICD-10-CM

## 2022-12-03 DIAGNOSIS — Z87891 Personal history of nicotine dependence: Secondary | ICD-10-CM

## 2022-12-03 DIAGNOSIS — Z803 Family history of malignant neoplasm of breast: Secondary | ICD-10-CM

## 2022-12-03 DIAGNOSIS — S0990XA Unspecified injury of head, initial encounter: Secondary | ICD-10-CM | POA: Diagnosis not present

## 2022-12-03 DIAGNOSIS — K5900 Constipation, unspecified: Secondary | ICD-10-CM | POA: Diagnosis present

## 2022-12-03 DIAGNOSIS — S72002A Fracture of unspecified part of neck of left femur, initial encounter for closed fracture: Secondary | ICD-10-CM | POA: Diagnosis present

## 2022-12-03 DIAGNOSIS — Y92019 Unspecified place in single-family (private) house as the place of occurrence of the external cause: Secondary | ICD-10-CM | POA: Diagnosis not present

## 2022-12-03 DIAGNOSIS — T1490XA Injury, unspecified, initial encounter: Secondary | ICD-10-CM | POA: Diagnosis not present

## 2022-12-03 DIAGNOSIS — Z043 Encounter for examination and observation following other accident: Secondary | ICD-10-CM | POA: Diagnosis not present

## 2022-12-03 DIAGNOSIS — E872 Acidosis, unspecified: Secondary | ICD-10-CM | POA: Diagnosis present

## 2022-12-03 DIAGNOSIS — Z8550 Personal history of malignant neoplasm of unspecified urinary tract organ: Secondary | ICD-10-CM

## 2022-12-03 DIAGNOSIS — E119 Type 2 diabetes mellitus without complications: Secondary | ICD-10-CM | POA: Diagnosis present

## 2022-12-03 DIAGNOSIS — S22088A Other fracture of T11-T12 vertebra, initial encounter for closed fracture: Secondary | ICD-10-CM

## 2022-12-03 DIAGNOSIS — M48061 Spinal stenosis, lumbar region without neurogenic claudication: Secondary | ICD-10-CM | POA: Diagnosis not present

## 2022-12-03 DIAGNOSIS — R531 Weakness: Secondary | ICD-10-CM | POA: Diagnosis not present

## 2022-12-03 DIAGNOSIS — D72829 Elevated white blood cell count, unspecified: Secondary | ICD-10-CM

## 2022-12-03 DIAGNOSIS — S3992XA Unspecified injury of lower back, initial encounter: Secondary | ICD-10-CM | POA: Diagnosis not present

## 2022-12-03 DIAGNOSIS — M4986 Spondylopathy in diseases classified elsewhere, lumbar region: Secondary | ICD-10-CM | POA: Diagnosis not present

## 2022-12-03 DIAGNOSIS — Z853 Personal history of malignant neoplasm of breast: Secondary | ICD-10-CM | POA: Diagnosis not present

## 2022-12-03 DIAGNOSIS — Z96653 Presence of artificial knee joint, bilateral: Secondary | ICD-10-CM | POA: Diagnosis present

## 2022-12-03 DIAGNOSIS — M2578 Osteophyte, vertebrae: Secondary | ICD-10-CM | POA: Diagnosis present

## 2022-12-03 DIAGNOSIS — S22009A Unspecified fracture of unspecified thoracic vertebra, initial encounter for closed fracture: Principal | ICD-10-CM

## 2022-12-03 DIAGNOSIS — E871 Hypo-osmolality and hyponatremia: Secondary | ICD-10-CM | POA: Diagnosis present

## 2022-12-03 DIAGNOSIS — D849 Immunodeficiency, unspecified: Secondary | ICD-10-CM | POA: Diagnosis not present

## 2022-12-03 DIAGNOSIS — R41841 Cognitive communication deficit: Secondary | ICD-10-CM | POA: Diagnosis not present

## 2022-12-03 DIAGNOSIS — R58 Hemorrhage, not elsewhere classified: Secondary | ICD-10-CM | POA: Diagnosis not present

## 2022-12-03 DIAGNOSIS — W01190A Fall on same level from slipping, tripping and stumbling with subsequent striking against furniture, initial encounter: Secondary | ICD-10-CM | POA: Diagnosis present

## 2022-12-03 DIAGNOSIS — Z7401 Bed confinement status: Secondary | ICD-10-CM | POA: Diagnosis not present

## 2022-12-03 DIAGNOSIS — Z923 Personal history of irradiation: Secondary | ICD-10-CM

## 2022-12-03 DIAGNOSIS — Z859 Personal history of malignant neoplasm, unspecified: Secondary | ICD-10-CM | POA: Diagnosis not present

## 2022-12-03 DIAGNOSIS — W19XXXA Unspecified fall, initial encounter: Secondary | ICD-10-CM

## 2022-12-03 DIAGNOSIS — E861 Hypovolemia: Secondary | ICD-10-CM | POA: Diagnosis present

## 2022-12-03 DIAGNOSIS — D649 Anemia, unspecified: Secondary | ICD-10-CM | POA: Diagnosis present

## 2022-12-03 DIAGNOSIS — M5432 Sciatica, left side: Secondary | ICD-10-CM | POA: Diagnosis present

## 2022-12-03 DIAGNOSIS — Z79890 Hormone replacement therapy (postmenopausal): Secondary | ICD-10-CM

## 2022-12-03 DIAGNOSIS — E1169 Type 2 diabetes mellitus with other specified complication: Secondary | ICD-10-CM | POA: Diagnosis not present

## 2022-12-03 DIAGNOSIS — Z885 Allergy status to narcotic agent status: Secondary | ICD-10-CM

## 2022-12-03 DIAGNOSIS — Y9301 Activity, walking, marching and hiking: Secondary | ICD-10-CM | POA: Diagnosis present

## 2022-12-03 DIAGNOSIS — S22080A Wedge compression fracture of T11-T12 vertebra, initial encounter for closed fracture: Secondary | ICD-10-CM | POA: Diagnosis not present

## 2022-12-03 DIAGNOSIS — S22009D Unspecified fracture of unspecified thoracic vertebra, subsequent encounter for fracture with routine healing: Secondary | ICD-10-CM | POA: Diagnosis not present

## 2022-12-03 DIAGNOSIS — Z79899 Other long term (current) drug therapy: Secondary | ICD-10-CM

## 2022-12-03 DIAGNOSIS — I10 Essential (primary) hypertension: Secondary | ICD-10-CM | POA: Diagnosis not present

## 2022-12-03 DIAGNOSIS — Z905 Acquired absence of kidney: Secondary | ICD-10-CM

## 2022-12-03 DIAGNOSIS — K219 Gastro-esophageal reflux disease without esophagitis: Secondary | ICD-10-CM | POA: Diagnosis present

## 2022-12-03 DIAGNOSIS — M5489 Other dorsalgia: Secondary | ICD-10-CM | POA: Diagnosis present

## 2022-12-03 DIAGNOSIS — M6281 Muscle weakness (generalized): Secondary | ICD-10-CM | POA: Diagnosis not present

## 2022-12-03 DIAGNOSIS — E039 Hypothyroidism, unspecified: Secondary | ICD-10-CM

## 2022-12-03 DIAGNOSIS — M549 Dorsalgia, unspecified: Secondary | ICD-10-CM | POA: Diagnosis not present

## 2022-12-03 DIAGNOSIS — Z888 Allergy status to other drugs, medicaments and biological substances status: Secondary | ICD-10-CM

## 2022-12-03 DIAGNOSIS — Z9049 Acquired absence of other specified parts of digestive tract: Secondary | ICD-10-CM

## 2022-12-03 DIAGNOSIS — C649 Malignant neoplasm of unspecified kidney, except renal pelvis: Secondary | ICD-10-CM | POA: Diagnosis not present

## 2022-12-03 LAB — CBC WITH DIFFERENTIAL/PLATELET
Abs Immature Granulocytes: 0.07 10*3/uL (ref 0.00–0.07)
Basophils Absolute: 0 10*3/uL (ref 0.0–0.1)
Basophils Relative: 0 %
Eosinophils Absolute: 0 10*3/uL (ref 0.0–0.5)
Eosinophils Relative: 0 %
HCT: 33.2 % — ABNORMAL LOW (ref 36.0–46.0)
Hemoglobin: 11.3 g/dL — ABNORMAL LOW (ref 12.0–15.0)
Immature Granulocytes: 1 %
Lymphocytes Relative: 14 %
Lymphs Abs: 1.7 10*3/uL (ref 0.7–4.0)
MCH: 31.7 pg (ref 26.0–34.0)
MCHC: 34 g/dL (ref 30.0–36.0)
MCV: 93.3 fL (ref 80.0–100.0)
Monocytes Absolute: 1.2 10*3/uL — ABNORMAL HIGH (ref 0.1–1.0)
Monocytes Relative: 9 %
Neutro Abs: 9.9 10*3/uL — ABNORMAL HIGH (ref 1.7–7.7)
Neutrophils Relative %: 76 %
Platelets: 247 10*3/uL (ref 150–400)
RBC: 3.56 MIL/uL — ABNORMAL LOW (ref 3.87–5.11)
RDW: 13.2 % (ref 11.5–15.5)
WBC: 12.9 10*3/uL — ABNORMAL HIGH (ref 4.0–10.5)
nRBC: 0 % (ref 0.0–0.2)

## 2022-12-03 LAB — BASIC METABOLIC PANEL
Anion gap: 12 (ref 5–15)
BUN: 40 mg/dL — ABNORMAL HIGH (ref 8–23)
CO2: 16 mmol/L — ABNORMAL LOW (ref 22–32)
Calcium: 9.1 mg/dL (ref 8.9–10.3)
Chloride: 97 mmol/L — ABNORMAL LOW (ref 98–111)
Creatinine, Ser: 1.69 mg/dL — ABNORMAL HIGH (ref 0.44–1.00)
GFR, Estimated: 30 mL/min — ABNORMAL LOW (ref >60)
Glucose, Bld: 136 mg/dL — ABNORMAL HIGH (ref 70–99)
Potassium: 5.6 mmol/L — ABNORMAL HIGH (ref 3.5–5.1)
Sodium: 125 mmol/L — ABNORMAL LOW (ref 135–145)

## 2022-12-03 MED ORDER — LEVOTHYROXINE SODIUM 100 MCG PO TABS
100.0000 ug | ORAL_TABLET | Freq: Every day | ORAL | Status: DC
  Administered 2022-12-04 – 2022-12-08 (×5): 100 ug via ORAL
  Filled 2022-12-03 (×5): qty 1

## 2022-12-03 MED ORDER — ONDANSETRON HCL 4 MG/2ML IJ SOLN
4.0000 mg | Freq: Four times a day (QID) | INTRAMUSCULAR | Status: DC | PRN
  Administered 2022-12-04 – 2022-12-07 (×3): 4 mg via INTRAVENOUS
  Filled 2022-12-03 (×3): qty 2

## 2022-12-03 MED ORDER — HYDROCODONE-ACETAMINOPHEN 5-325 MG PO TABS: 1.0000 | ORAL_TABLET | Freq: Four times a day (QID) | ORAL | Status: DC | PRN

## 2022-12-03 MED ORDER — IBUPROFEN 400 MG PO TABS
600.0000 mg | ORAL_TABLET | Freq: Once | ORAL | Status: DC
  Filled 2022-12-03: qty 3

## 2022-12-03 MED ORDER — SODIUM CHLORIDE 0.9 % IV BOLUS
1000.0000 mL | Freq: Once | INTRAVENOUS | Status: AC
  Administered 2022-12-03: 1000 mL via INTRAVENOUS

## 2022-12-03 MED ORDER — SIMVASTATIN 20 MG PO TABS
20.0000 mg | ORAL_TABLET | Freq: Every day | ORAL | Status: DC
  Administered 2022-12-03 – 2022-12-07 (×5): 20 mg via ORAL
  Filled 2022-12-03 (×5): qty 1

## 2022-12-03 MED ORDER — SODIUM ZIRCONIUM CYCLOSILICATE 10 G PO PACK
10.0000 g | PACK | Freq: Once | ORAL | Status: AC
  Administered 2022-12-03: 10 g via ORAL
  Filled 2022-12-03: qty 1

## 2022-12-03 MED ORDER — HYDROMORPHONE HCL 1 MG/ML IJ SOLN
0.5000 mg | INTRAMUSCULAR | Status: DC | PRN
  Administered 2022-12-04 – 2022-12-07 (×4): 0.5 mg via INTRAVENOUS
  Filled 2022-12-03 (×4): qty 0.5

## 2022-12-03 MED ORDER — POLYETHYLENE GLYCOL 3350 17 G PO PACK: 17.0000 g | PACK | Freq: Every day | ORAL | Status: DC

## 2022-12-03 MED ORDER — ENOXAPARIN SODIUM 30 MG/0.3ML IJ SOSY
30.0000 mg | PREFILLED_SYRINGE | INTRAMUSCULAR | Status: DC
  Administered 2022-12-03: 30 mg via SUBCUTANEOUS
  Filled 2022-12-03: qty 0.3

## 2022-12-03 MED ORDER — HYDROCODONE-ACETAMINOPHEN 5-325 MG PO TABS
1.0000 | ORAL_TABLET | Freq: Once | ORAL | Status: AC
  Administered 2022-12-03: 1 via ORAL
  Filled 2022-12-03: qty 1

## 2022-12-03 MED ORDER — ZOLPIDEM TARTRATE 5 MG PO TABS
5.0000 mg | ORAL_TABLET | Freq: Every evening | ORAL | Status: DC | PRN
  Administered 2022-12-04 – 2022-12-07 (×3): 5 mg via ORAL
  Filled 2022-12-03 (×3): qty 1

## 2022-12-03 MED ORDER — ACETAMINOPHEN 325 MG PO TABS
650.0000 mg | ORAL_TABLET | Freq: Four times a day (QID) | ORAL | Status: DC | PRN
  Administered 2022-12-05: 650 mg via ORAL
  Filled 2022-12-03: qty 2

## 2022-12-03 MED ORDER — LIDOCAINE 5 % EX PTCH
1.0000 | MEDICATED_PATCH | CUTANEOUS | Status: DC
  Administered 2022-12-03 – 2022-12-07 (×5): 1 via TRANSDERMAL
  Filled 2022-12-03 (×5): qty 1

## 2022-12-03 MED ORDER — ONDANSETRON HCL 4 MG/2ML IJ SOLN
4.0000 mg | Freq: Once | INTRAMUSCULAR | Status: AC
  Administered 2022-12-03: 4 mg via INTRAVENOUS
  Filled 2022-12-03: qty 2

## 2022-12-03 MED ORDER — HYDROMORPHONE HCL 1 MG/ML IJ SOLN
1.0000 mg | Freq: Once | INTRAMUSCULAR | Status: AC
  Administered 2022-12-03: 1 mg via INTRAVENOUS
  Filled 2022-12-03: qty 1

## 2022-12-03 MED ORDER — SENNOSIDES-DOCUSATE SODIUM 8.6-50 MG PO TABS: 1.0000 | ORAL_TABLET | Freq: Every day | ORAL | Status: DC

## 2022-12-03 MED ORDER — METHOCARBAMOL 500 MG PO TABS
500.0000 mg | ORAL_TABLET | Freq: Three times a day (TID) | ORAL | Status: DC
  Administered 2022-12-03 – 2022-12-08 (×14): 500 mg via ORAL
  Filled 2022-12-03 (×14): qty 1

## 2022-12-03 MED ORDER — SODIUM CHLORIDE 0.9 % IV SOLN: INTRAVENOUS | Status: DC

## 2022-12-03 MED ORDER — SENNOSIDES-DOCUSATE SODIUM 8.6-50 MG PO TABS
1.0000 | ORAL_TABLET | Freq: Every day | ORAL | Status: DC
  Administered 2022-12-04 – 2022-12-07 (×4): 1 via ORAL
  Filled 2022-12-03 (×4): qty 1

## 2022-12-03 NOTE — Assessment & Plan Note (Addendum)
-  unclear baseline with no recent prior for comparison. 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

## 2022-12-03 NOTE — Progress Notes (Signed)
Orthopedic Tech Progress Note Patient Details:  Dawn Bradley [birth year] [id number]  Spoke with RN , patient is getting admitted tonight, tomorrow ORTHO [facility] will apply LSO and not TLSO per MD CABBELL NEURO for comfort.     Patient ID: Dawn Bradley, female   DOB: 12-09-1940, 82 y.o.   MRN: [id number]  Donald Pore 12/03/2022, 9:52 PM

## 2022-12-03 NOTE — ED Provider Notes (Signed)
[place] EMERGENCY DEPARTMENT AT [facility] Provider Note   CSN: [id number] Arrival date & time: 12/03/22  1910     History  Chief Complaint  Patient presents with   Dawn Bradley is a 82 y.o. female presented to the ER with a mechanical fall, burning she struck her forehead on a chair, and also injured her back.  She is having pain in her mid back and lower back, including on the left posterior hip.  Pain is worse with any movement.  Not on blood thinners.  HPI     Home Medications Prior to Admission medications   Medication Sig Start Date End Date Taking? Authorizing Provider  acetaminophen (TYLENOL) 325 MG tablet Take 650 mg by mouth every 6 (six) hours as needed.   Yes [provider]  amoxicillin (AMOXIL) 500 MG capsule TAKE 4 CAPSULES 1 HOUR PRIOR TO DENTAL PROCEDURE   Yes [provider]  HYDROcodone-acetaminophen (NORCO/VICODIN) 5-325 MG tablet Take 1 tablet by mouth every 6 (six) hours as needed for moderate pain. 11/28/22  Yes [provider]  levothyroxine (SYNTHROID) 100 MCG tablet Take 100 mcg by mouth daily before breakfast.   Yes [provider]  lisinopril-hydrochlorothiazide (ZESTORETIC) 20-12.5 MG tablet Take 1 tablet by mouth 2 (two) times daily. 09/09/22  Yes [provider]  metformin (FORTAMET) 500 MG (OSM) 24 hr tablet Take 500 mg by mouth 2 (two) times daily with a meal.   Yes [provider]  methocarbamol (ROBAXIN) 500 MG tablet Take 500 mg by mouth 3 (three) times daily. 11/28/22  Yes [provider]  simvastatin (ZOCOR) 20 MG tablet Take 20 mg by mouth at bedtime.   Yes [provider]  tirzepatide Greggory Keen) 2.5 MG/0.5ML Pen Inject 2.5mg  Subcutaneous once weekly 30 days 11/11/22  Yes   valACYclovir (VALTREX) 1000 MG tablet Take 1,000 mg by mouth 2 (two) times daily as needed.   Yes [provider]  Vitamin D, Ergocalciferol, (DRISDOL) 50000 UNITS CAPS  Take 50,000 Units by mouth. Pt takes on Saturdays.   Yes [provider]  zolpidem (AMBIEN) 10 MG tablet Take 10 mg by mouth at bedtime as needed for sleep.   Yes [provider]  letrozole (FEMARA) 2.5 MG tablet Take 1 tablet (2.5 mg total) by mouth daily. Patient not taking: Reported on 12/03/2022 11/06/15   Boelter, Gwendolyn Fill, NP  tirzepatide [facility]) 5 MG/0.5ML Pen Inject 5 mg into the skin once a week. 10/02/22         Allergies    Morphine, Pholcodine, Shellfish allergy, Shellfish-derived products, Codeine, Latex, Other, and Tramadol    Review of Systems   Review of Systems  Physical Exam Updated Vital Signs BP (!) 133/50 (BP Location: Right Arm)   Pulse 81   Temp (!) 97.4 F (36.3 C) (Oral)   Resp 11   Ht 5\' 4"  (1.626 m)   Wt 90.7 kg   SpO2 96%   BMI 34.33 kg/m  Physical Exam Constitutional:      General: She is not in acute distress. HENT:     Head: Normocephalic.  Eyes:     Conjunctiva/sclera: Conjunctivae normal.     Pupils: Pupils are equal, round, and reactive to light.  Cardiovascular:     Rate and Rhythm: Normal rate and regular rhythm.  Pulmonary:     Effort: Pulmonary effort is normal. No respiratory distress.  Abdominal:     General: There is no distension.  Tenderness: There is no abdominal tenderness.  Musculoskeletal:     Comments: No tenderness range of motion testing at the hip, patient does have T-spine and L-spine midline tenderness, also paraspinal tenderness including the left posterior pelvis  Skin:    General: Skin is warm and dry.  Neurological:     General: No focal deficit present.     Mental Status: She is alert. Mental status is at baseline.  Psychiatric:        Mood and Affect: Mood normal.        Behavior: Behavior normal.     ED Results / Procedures / Treatments   Labs (all labs ordered are listed, but only abnormal results are displayed) Labs Reviewed  BASIC METABOLIC PANEL - Abnormal; Notable for the  following components:      Result Value   Sodium 125 (*)    Potassium 5.6 (*)    Chloride 97 (*)    CO2 16 (*)    Glucose, Bld 136 (*)    BUN 40 (*)    Creatinine, Ser 1.69 (*)    GFR, Estimated 30 (*)    All other components within normal limits  CBC WITH DIFFERENTIAL/PLATELET - Abnormal; Notable for the following components:   WBC 12.9 (*)    RBC 3.56 (*)    Hemoglobin 11.3 (*)    HCT 33.2 (*)    Neutro Abs 9.9 (*)    Monocytes Absolute 1.2 (*)    All other components within normal limits  BASIC METABOLIC PANEL  CBC    EKG None  Radiology CT Thoracic Spine Wo Contrast  Result Date: 12/03/2022 CLINICAL DATA:  Back trauma, no prior imaging (Age >= 16y) mid-thoracic spinal tenderness after fall; Back trauma, no prior imaging (Age >= 16y) EXAM: CT Thoracic and Lumbar spine none contrast TECHNIQUE: Multiplanar CT images of the thoracic and lumbar spine were reconstructed from contemporary CT of the Chest, Abdomen, and Pelvis. RADIATION DOSE REDUCTION: This exam was performed according to the departmental dose-optimization program which includes automated exposure control, adjustment of the mA and/or kV according to patient size and/or use of iterative reconstruction technique. CONTRAST:  None or No additional COMPARISON:  MRI lumbar spine 03/12/2022, CT abdomen pelvis 07/26/2014 FINDINGS: CT THORACIC SPINE FINDINGS Alignment: Normal. Vertebrae: Multilevel degenerative changes spine with continues osteophyte formation with a cortical break through the T11 vertebral body osteophyte and fractures of the vertebral body. Paraspinal and other soft tissues: Negative. Disc levels: Grossly maintained CT LUMBAR SPINE FINDINGS Segmentation: 5 lumbar type vertebrae. Alignment: Stable grade 1 anterolisthesis of L5 on S1. Vertebrae: No acute fracture or focal pathologic process. Paraspinal and other soft tissues: Negative. Disc levels: Intervertebral disc space vacuum phenomenon at the L4-L5 and L5-S1  levels. Other: Atherosclerotic plaque of the aorta.  Colonic diverticulosis. IMPRESSION: 1. Acute T11 fracture through the continuous osteophytes and vertebral body. Recommend MRI for further evaluation of the ligaments. 2. No acute displaced fracture or traumatic listhesis of the lumbar spine. 3.  Aortic Atherosclerosis (ICD10-I70.0). Electronically Signed   By: Tish Frederickson M.D.   On: 12/03/2022 20:41   CT Lumbar Spine Wo Contrast  Result Date: 12/03/2022 CLINICAL DATA:  Back trauma, no prior imaging (Age >= 16y) mid-thoracic spinal tenderness after fall; Back trauma, no prior imaging (Age >= 16y) EXAM: CT Thoracic and Lumbar spine none contrast TECHNIQUE: Multiplanar CT images of the thoracic and lumbar spine were reconstructed from contemporary CT of the Chest, Abdomen, and Pelvis. RADIATION DOSE REDUCTION: This  exam was performed according to the departmental dose-optimization program which includes automated exposure control, adjustment of the mA and/or kV according to patient size and/or use of iterative reconstruction technique. CONTRAST:  None or No additional COMPARISON:  MRI lumbar spine 03/12/2022, CT abdomen pelvis 07/26/2014 FINDINGS: CT THORACIC SPINE FINDINGS Alignment: Normal. Vertebrae: Multilevel degenerative changes spine with continues osteophyte formation with a cortical break through the T11 vertebral body osteophyte and fractures of the vertebral body. Paraspinal and other soft tissues: Negative. Disc levels: Grossly maintained CT LUMBAR SPINE FINDINGS Segmentation: 5 lumbar type vertebrae. Alignment: Stable grade 1 anterolisthesis of L5 on S1. Vertebrae: No acute fracture or focal pathologic process. Paraspinal and other soft tissues: Negative. Disc levels: Intervertebral disc space vacuum phenomenon at the L4-L5 and L5-S1 levels. Other: Atherosclerotic plaque of the aorta.  Colonic diverticulosis. IMPRESSION: 1. Acute T11 fracture through the continuous osteophytes and vertebral  body. Recommend MRI for further evaluation of the ligaments. 2. No acute displaced fracture or traumatic listhesis of the lumbar spine. 3.  Aortic Atherosclerosis (ICD10-I70.0). Electronically Signed   By: Tish Frederickson M.D.   On: 12/03/2022 20:41   DG HIP UNILAT WITH PELVIS 2-3 VIEWS RIGHT  Result Date: 12/03/2022 CLINICAL DATA:  Trauma, fall EXAM: DG HIP (WITH OR WITHOUT PELVIS) 2-3V RIGHT COMPARISON:  None Available. FINDINGS: No recent fracture or dislocation is seen. Small smooth marginated calcification is seen in the soft tissues lateral to the right iliac bone, possibly residual from previous ligament injury. Small bony spurs are seen in right hip. Degenerative changes are noted in lower lumbar spine. IMPRESSION: No recent fracture or dislocation is seen. Small bony spurs are seen in right hip. Lumbar spondylosis. Electronically Signed   By: Ernie Avena M.D.   On: 12/03/2022 20:41   CT Head Wo Contrast  Result Date: 12/03/2022 CLINICAL DATA:  Head trauma, moderate-severe; Polytrauma, blunt EXAM: CT HEAD WITHOUT CONTRAST CT CERVICAL SPINE WITHOUT CONTRAST TECHNIQUE: Multidetector CT imaging of the head and cervical spine was performed following the standard protocol without intravenous contrast. Multiplanar CT image reconstructions of the cervical spine were also generated. RADIATION DOSE REDUCTION: This exam was performed according to the departmental dose-optimization program which includes automated exposure control, adjustment of the mA and/or kV according to patient size and/or use of iterative reconstruction technique. COMPARISON:  None Available. FINDINGS: CT HEAD FINDINGS Brain: Cerebral ventricle sizes are concordant with the degree of cerebral volume loss. Patchy and confluent areas of decreased attenuation are noted throughout the deep and periventricular white matter of the cerebral hemispheres bilaterally, compatible with chronic microvascular ischemic disease. No evidence of  large-territorial acute infarction. No parenchymal hemorrhage. No mass lesion. No extra-axial collection. No mass effect or midline shift. No hydrocephalus. Basilar cisterns are patent. Vascular: No hyperdense vessel. Atherosclerotic calcifications are present within the cavernous internal carotid and vertebral arteries. Skull: No acute fracture or focal lesion. Sinuses/Orbits: Paranasal sinuses and mastoid air cells are clear. The orbits are unremarkable. Other: None. CT CERVICAL SPINE FINDINGS Alignment: Grade 1 anterolisthesis of C4 on C5. Skull base and vertebrae: Multilevel moderate degenerative change of the spine. No acute fracture. No aggressive appearing focal osseous lesion or focal pathologic process. Soft tissues and spinal canal: No prevertebral fluid or swelling. No visible canal hematoma. Upper chest: Unremarkable. Other: None. IMPRESSION: 1. No acute intracranial abnormality. 2. No acute displaced fracture or traumatic listhesis of the cervical spine. Electronically Signed   By: Tish Frederickson M.D.   On: 12/03/2022 20:36   CT  Cervical Spine Wo Contrast  Result Date: 12/03/2022 CLINICAL DATA:  Head trauma, moderate-severe; Polytrauma, blunt EXAM: CT HEAD WITHOUT CONTRAST CT CERVICAL SPINE WITHOUT CONTRAST TECHNIQUE: Multidetector CT imaging of the head and cervical spine was performed following the standard protocol without intravenous contrast. Multiplanar CT image reconstructions of the cervical spine were also generated. RADIATION DOSE REDUCTION: This exam was performed according to the departmental dose-optimization program which includes automated exposure control, adjustment of the mA and/or kV according to patient size and/or use of iterative reconstruction technique. COMPARISON:  None Available. FINDINGS: CT HEAD FINDINGS Brain: Cerebral ventricle sizes are concordant with the degree of cerebral volume loss. Patchy and confluent areas of decreased attenuation are noted throughout the deep  and periventricular white matter of the cerebral hemispheres bilaterally, compatible with chronic microvascular ischemic disease. No evidence of large-territorial acute infarction. No parenchymal hemorrhage. No mass lesion. No extra-axial collection. No mass effect or midline shift. No hydrocephalus. Basilar cisterns are patent. Vascular: No hyperdense vessel. Atherosclerotic calcifications are present within the cavernous internal carotid and vertebral arteries. Skull: No acute fracture or focal lesion. Sinuses/Orbits: Paranasal sinuses and mastoid air cells are clear. The orbits are unremarkable. Other: None. CT CERVICAL SPINE FINDINGS Alignment: Grade 1 anterolisthesis of C4 on C5. Skull base and vertebrae: Multilevel moderate degenerative change of the spine. No acute fracture. No aggressive appearing focal osseous lesion or focal pathologic process. Soft tissues and spinal canal: No prevertebral fluid or swelling. No visible canal hematoma. Upper chest: Unremarkable. Other: None. IMPRESSION: 1. No acute intracranial abnormality. 2. No acute displaced fracture or traumatic listhesis of the cervical spine. Electronically Signed   By: Tish Frederickson M.D.   On: 12/03/2022 20:36    Procedures Procedures    Medications Ordered in ED Medications  ibuprofen (ADVIL) tablet 600 mg (600 mg Oral Not Given 12/03/22 2004)  acetaminophen (TYLENOL) tablet 650 mg (has no administration in time range)  simvastatin (ZOCOR) tablet 20 mg (20 mg Oral Given 12/03/22 2206)  zolpidem (AMBIEN) tablet 5 mg (has no administration in time range)  levothyroxine (SYNTHROID) tablet 100 mcg (has no administration in time range)  methocarbamol (ROBAXIN) tablet 500 mg (500 mg Oral Given 12/03/22 2200)  HYDROcodone-acetaminophen (NORCO/VICODIN) 5-325 MG per tablet 1-2 tablet (has no administration in time range)  enoxaparin (LOVENOX) injection 30 mg (30 mg Subcutaneous Given 12/03/22 2206)  lidocaine (LIDODERM) 5 % 1 patch (has no  administration in time range)  HYDROcodone-acetaminophen (NORCO/VICODIN) 5-325 MG per tablet 1 tablet (1 tablet Oral Given 12/03/22 2004)  HYDROmorphone (DILAUDID) injection 1 mg (1 mg Intravenous Given 12/03/22 2109)  ondansetron (ZOFRAN) injection 4 mg (4 mg Intravenous Given 12/03/22 2110)  sodium chloride 0.9 % bolus 1,000 mL (1,000 mLs Intravenous New Bag/Given 12/03/22 2201)  sodium zirconium cyclosilicate (LOKELMA) packet 10 g (10 g Oral Given 12/03/22 2200)    ED Course/ Medical Decision Making/ A&P Clinical Course as of 12/03/22 2226  Wed Dec 03, 2022  2104 T11 compression fx - will discuss with [facility] - MRI ordered per my discussion with radiologist - overextension mechanism with potential injury to ligament.  Pt and husband updated. She is needing IV pain medications now, and likely admission for pain control  [MT]  2133 Dr Franky Macho from NS[facility] reviewed the patient's images and does not feel the patient is requiring an emergent MRI, has lower concern for ligament injury based on this presentation.  He advises this fracture is stable for outpatient f/u - patient may benefit from TSLO brace. [  MT]  2145 Admitted to hospitalist DR Cyndia Bent [MT]    Clinical Course User Index [MT] Terald Sleeper, MD                             Medical Decision Making Amount and/or Complexity of Data Reviewed Labs: ordered. Radiology: ordered. ECG/medicine tests: ordered.  Risk OTC drugs. Prescription drug management. Decision regarding hospitalization.   Patient is here with mechanical fall and several isolated injuries, but really involving the forehead and the mid and lower back.  Her husband at the bedside provides supplemental history.  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  Oral pain medications were ordered here, including Norco, show patient takes chronically at home for pain.  She does not tolerate  many other forms of opioids  CT imaging and x-ray imaging was personally reviewed and interpreted, notable for thoracic compression fracture  Additional IV pain medications were given.  Labs were reviewed showing likely prerenal AKI, hyponatremia and hyperkalemia.  IV saline fluids ordered for this.  This was discussed with the neurosurgeon on phone, please see ED course.  Patient is stable for medical admission at this time.  Patient's EKG per my interpretation shows a sinus rhythm without acute ischemic findings, no evidence of significant hyperkalemic changes.        Final Clinical Impression(s) / ED Diagnoses Final diagnoses:  Closed fracture of body of posterior thoracic vertebra, initial encounter (HCC)  Fall, initial encounter  Hyponatremia  Hyperkalemia    Rx / DC Orders ED Discharge Orders     None         Tiyona Desouza, Kermit Balo, MD 12/03/22 2227

## 2022-12-03 NOTE — Assessment & Plan Note (Signed)
-  5.6 on presentation -received Lokelma in ED -follow repeat in the morning

## 2022-12-03 NOTE — H&P (Signed)
History and Physical    Patient: Dawn Bradley AVW:[id number] DOB: 01-11-1941 DOA: 12/03/2022 DOS: the patient was seen and examined on 12/03/2022 PCP: Geoffry Paradise, MD  Patient coming from: Home  Chief Complaint:  Chief Complaint  Patient presents with   Fall   HPI: [name] is a 82 y.o. 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   Pt was walking and tripped on her cane. Hit her nose on a chair and landed on her side. Has mid-back pain. Denies any weakness, dizziness. Although she has right sciatica pain and has been taking hydrocodone PRN.  She was otherwise in her normal state of health without any symptoms. No nausea, vomiting, diarrhea. Has been constipated for 2 days.  Denies any hx of recurrent falls.    In the ED, she was afebrile, normotensive on room air.   Leukocytosis of 12.9 with left shift. Hgb of 11.3.  Hyponatremia of 125, hyperkalemia of 5.6, Cl of 97, CO2 of 16, Creatinine of 1.69, CBG of 136 with no increase anion gap.   Imaging reviewing for T11 acute fracture. ED physician discussed with neurosurgery Dr. Franky Macho who does not recommend any further imaging or intervention. TLSO as needed. Hospitalist consulted for pain management.   Review of Systems: As mentioned in the history of present illness. All other systems reviewed and are negative. Past Medical History:  Diagnosis Date   Abnormal radiologic findings on diagnostic imaging of right kidney    Angina at rest    Atherosclerosis of aorta (HCC)    Breast cancer (HCC)    Cancer (HCC)    lt breast   Chest discomfort    Diabetes mellitus    pre-diabetic   DOE (dyspnea on exertion)    GERD (gastroesophageal reflux disease)    Hyperlipidemia    Hypertension    Hypothyroid    Insomnia     Obesity    Osteoarthritis    Personal history of malignant neoplasm of breast    Personal history of radiation therapy    Spinal stenosis of lumbar region, unspecified whether neurogenic claudication present    Swelling of lower extremity    Vitamin D deficiency    Past Surgical History:  Procedure Laterality Date   ANTERIOR CRUCIATE LIGAMENT REPAIR  1960   APPENDECTOMY  1962   BREAST LUMPECTOMY  06/11/2011   Procedure: LUMPECTOMY;  Surgeon: Robyne Askew, MD;  Location: [facility];  Service: General;  Laterality: Left;  Left Needle localization lumpectomy   CHOLECYSTECTOMY  1971   JOINT REPLACEMENT     both knees 2/12   REPLACEMENT TOTAL KNEE BILATERAL  09/06/10   TUBAL LIGATION  1980   Social History:  reports that she has quit smoking. She does not have any smokeless tobacco history on file. She reports current alcohol use. She reports that she does not use drugs.  Allergies  Allergen Reactions   Morphine Nausea And Vomiting, Other (See Comments) and Shortness Of Breath    Reaction: Heart races irregularly.  Patient states she is not allergic to Morphine 03-06-16.   Pholcodine     Other Reaction(s): respiratory distress   Shellfish Allergy Hives and Swelling   Shellfish-Derived Products Hives, Other (See Comments) and Swelling    Reaction: Red face   Codeine Other (See Comments)  REACTION: heart races  REACTION: heart races  REACTION: Heart races irregularly, vomiting and shortness of breath   Latex Itching   Other Other (See Comments)    Faux gold causes itching and skin to turn green   Tramadol Nausea And Vomiting    Family History  Problem Relation Age of Onset   Cancer Father        kidney   Breast cancer Sister    Cancer Sister        breast    Prior to Admission medications   Medication Sig Start Date End Date Taking? Authorizing Provider  acetaminophen (TYLENOL) 325 MG tablet Take 650 mg by mouth every 6 (six) hours as needed.   Yes  [provider]  HYDROcodone-acetaminophen (NORCO/VICODIN) 5-325 MG tablet Take 1 tablet by mouth every 6 (six) hours as needed for moderate pain. 11/28/22  Yes [provider]  Levothyroxine Sodium (SYNTHROID PO) Take 88 mcg by mouth daily.    Yes [provider]  lisinopril-hydrochlorothiazide (ZESTORETIC) 20-12.5 MG tablet Take 1 tablet by mouth 2 (two) times daily. 09/09/22  Yes [provider]  metformin (FORTAMET) 500 MG (OSM) 24 hr tablet Take 500 mg by mouth 2 (two) times daily with a meal.   Yes [provider]  methocarbamol (ROBAXIN) 500 MG tablet Take 500 mg by mouth 3 (three) times daily. 11/28/22  Yes [provider]  simvastatin (ZOCOR) 10 MG tablet Take 20 mg by mouth at bedtime.    Yes [provider]  valACYclovir (VALTREX) 1000 MG tablet Take 1,000 mg by mouth 2 (two) times daily as needed.   Yes [provider]  Vitamin D, Ergocalciferol, (DRISDOL) 50000 UNITS CAPS Take 50,000 Units by mouth. Pt takes on Saturdays.   Yes [provider]  amoxicillin (AMOXIL) 500 MG capsule TAKE 4 CAPSULES 1 HOUR PRIOR TO DENTAL PROCEDURE    [provider]  gabapentin (NEURONTIN) 300 MG capsule Take 300 mg by mouth 3 (three) times daily. 11/18/22   [provider]  letrozole (FEMARA) 2.5 MG tablet Take 1 tablet (2.5 mg total) by mouth daily. Patient not taking: Reported on 12/03/2022 11/06/15   Sheffield Slider, NP  tirzepatide [facility]) 2.5 MG/0.5ML Pen Inject 2.5mg  Subcutaneous once weekly 30 days 11/11/22     tirzepatide [facility]) 5 MG/0.5ML Pen Inject 5 mg into the skin once a week. 10/02/22     zolpidem (AMBIEN) 5 MG tablet Take 5 mg by mouth at bedtime as needed.     [provider]    Physical Exam: Vitals:   12/03/22 1943 12/03/22 2045 12/03/22 2152 12/03/22 2301  BP:  (!) 144/106 (!) 133/50 (!) 147/86  Pulse:  70 81 83  Resp:  15 11 16   Temp: 98 F (36.7 C)  (!) 97.4 F (36.3  C) 98.1 F (36.7 C)  TempSrc:   Oral Oral  SpO2:  100% 96% 99%  Weight:      Height:       Constitutional: NAD, calm, comfortable, obese female laying flat in bed Eyes: lids and conjunctivae normal ENMT: Mucous membranes are moist. Neck: normal, supple Respiratory: clear to auscultation bilaterally, no wheezing, no crackles. Normal respiratory effort. No accessory muscle use.  Cardiovascular: Regular rate and rhythm, no murmurs / rubs / gallops. No extremity edema. 2+ pedal pulses. Abdomen:soft, no tenderness,  Bowel sounds positive.  Musculoskeletal: no clubbing / cyanosis. No joint deformity upper and lower extremities.  Normal muscle tone.  Skin: no rashes,  lesions, ulcers.  Neurologic: CN 2-12 grossly intact. Sensation intact, Strength 5/5 in all 4.  Psychiatric: Normal judgment and insight. Alert and oriented x 3. Normal mood.  Data Reviewed:  See HPI  Assessment and Plan: * Thoracic spine fracture (HCC) -Acute T11 vertebral body fracture following fall -imaging has been evaluated by neurosurgery Dr. Franky Macho who does not feel she would benefit from further MRI imaging or any intervention as fracture is stable. Needs follow up with neurosurgery outpatient -recommends LSO brace as needed -PT evaluation in the morning -PRN hydrocodone and IV dilaudid for pain -PRN IV antiemetics   Leukocytosis -possibly reactive. Denies any symptoms of potential illness.  History of carcinoma -ight upper tract Urothelial carcinoma  s/p radial right nephroureterectomy (07/2018) -follows with [facility] and recently had surveillance cystoscopy in 07/2022   Obesity (BMI 30-39.9) -BMI of 34  Hypothyroidism -continue levothyroxine  AKI (acute kidney injury) (HCC) -unclear baseline with no recent prior for comparison. Has hx of right nephrectomy due to urothelial malignancy -creatinine at 1.69 on presentation -will follow after IV fluids overnight  -hold home  Lisinopril-HCTZ  Hyperkalemia -5.6 on presentation -received Lokelma in ED -follow repeat in the morning  Hyponatremia -Na of 125 likely hypovolemia -keep on continuous IV fluid overnight and follow in the morning -hold HCTZ      Advance Care Planning:   Code Status: Full Code   Consults: NEUROSURGERY  Family Communication: husband at bedside  Severity of Illness: The appropriate patient status for this patient is OBSERVATION. Observation status is judged to be reasonable and necessary in order to provide the required intensity of service to ensure the patient's safety. The patient's presenting symptoms, physical exam findings, and initial radiographic and laboratory data in the context of their medical condition is felt to place them at decreased risk for further clinical deterioration. Furthermore, it is anticipated that the patient will be medically stable for discharge from the hospital within 2 midnights of admission.   Author: Anselm Jungling, DO 12/03/2022 11:28 PM  For on call review [email or web address].

## 2022-12-03 NOTE — Assessment & Plan Note (Signed)
-  Na of 125 likely hypovolemia -keep on continuous IV fluid overnight and follow in the morning -hold HCTZ

## 2022-12-03 NOTE — Assessment & Plan Note (Addendum)
-  Acute T11 vertebral body fracture following fall -imaging has been evaluated by neurosurgery Dr. Franky Macho who does not feel she would benefit from further MRI imaging or any intervention as fracture is stable. Needs follow up with neurosurgery outpatient -recommends LSO brace as needed -PT evaluation in the morning -lidocaine patch -PRN Tylenol mild pain  -PRN hydrocodone and IV dilaudid for moderate/severe pain -PRN IV antiemetics

## 2022-12-03 NOTE — Assessment & Plan Note (Signed)
BMI of 34 

## 2022-12-03 NOTE — ED Triage Notes (Addendum)
BIBA from home for mechanical fall 30 min prior to hospital, tripped over cane fell head first onto a chair and hit floor, Rt side back pain, hurt nose, no LOC, no blood thinners 152/90 68 99% RA CBG 137

## 2022-12-03 NOTE — Assessment & Plan Note (Signed)
-  ight upper tract Urothelial carcinoma  s/p radial right nephroureterectomy (07/2018) -follows with [facility] and recently had surveillance cystoscopy in 07/2022

## 2022-12-03 NOTE — Progress Notes (Signed)
Patient ID: Dawn Bradley, female   DOB: 06-24-1941, 82 y.o.   MRN: [id number] Films reviewed. I do not believe the fracture is unstable, the large lateral right osteophyte is solidly fused, the fracture line extends 3mm into the osteophyte. I do not understand how an MRI would provide information that is needed. Her exam belies a cord injury.  May use an LSO if it helps with her comfort.

## 2022-12-03 NOTE — Assessment & Plan Note (Signed)
-  possibly reactive. Denies any symptoms of potential illness.

## 2022-12-03 NOTE — Assessment & Plan Note (Signed)
continue levothyroxine

## 2022-12-04 DIAGNOSIS — S72002A Fracture of unspecified part of neck of left femur, initial encounter for closed fracture: Secondary | ICD-10-CM | POA: Diagnosis present

## 2022-12-04 DIAGNOSIS — Z7401 Bed confinement status: Secondary | ICD-10-CM | POA: Diagnosis not present

## 2022-12-04 DIAGNOSIS — E871 Hypo-osmolality and hyponatremia: Secondary | ICD-10-CM | POA: Diagnosis present

## 2022-12-04 DIAGNOSIS — Z96653 Presence of artificial knee joint, bilateral: Secondary | ICD-10-CM | POA: Diagnosis present

## 2022-12-04 DIAGNOSIS — Z7984 Long term (current) use of oral hypoglycemic drugs: Secondary | ICD-10-CM | POA: Diagnosis not present

## 2022-12-04 DIAGNOSIS — E039 Hypothyroidism, unspecified: Secondary | ICD-10-CM | POA: Diagnosis present

## 2022-12-04 DIAGNOSIS — R2689 Other abnormalities of gait and mobility: Secondary | ICD-10-CM | POA: Diagnosis not present

## 2022-12-04 DIAGNOSIS — E872 Acidosis, unspecified: Secondary | ICD-10-CM | POA: Diagnosis present

## 2022-12-04 DIAGNOSIS — S22009A Unspecified fracture of unspecified thoracic vertebra, initial encounter for closed fracture: Secondary | ICD-10-CM

## 2022-12-04 DIAGNOSIS — Y9301 Activity, walking, marching and hiking: Secondary | ICD-10-CM | POA: Diagnosis present

## 2022-12-04 DIAGNOSIS — M5489 Other dorsalgia: Secondary | ICD-10-CM | POA: Diagnosis present

## 2022-12-04 DIAGNOSIS — W19XXXA Unspecified fall, initial encounter: Secondary | ICD-10-CM

## 2022-12-04 DIAGNOSIS — K5900 Constipation, unspecified: Secondary | ICD-10-CM | POA: Diagnosis present

## 2022-12-04 DIAGNOSIS — D72829 Elevated white blood cell count, unspecified: Secondary | ICD-10-CM | POA: Diagnosis present

## 2022-12-04 DIAGNOSIS — S22009D Unspecified fracture of unspecified thoracic vertebra, subsequent encounter for fracture with routine healing: Secondary | ICD-10-CM | POA: Diagnosis not present

## 2022-12-04 DIAGNOSIS — Z8550 Personal history of malignant neoplasm of unspecified urinary tract organ: Secondary | ICD-10-CM | POA: Diagnosis not present

## 2022-12-04 DIAGNOSIS — M4986 Spondylopathy in diseases classified elsewhere, lumbar region: Secondary | ICD-10-CM | POA: Diagnosis not present

## 2022-12-04 DIAGNOSIS — Z803 Family history of malignant neoplasm of breast: Secondary | ICD-10-CM | POA: Diagnosis not present

## 2022-12-04 DIAGNOSIS — E669 Obesity, unspecified: Secondary | ICD-10-CM | POA: Diagnosis present

## 2022-12-04 DIAGNOSIS — E119 Type 2 diabetes mellitus without complications: Secondary | ICD-10-CM | POA: Diagnosis present

## 2022-12-04 DIAGNOSIS — M5432 Sciatica, left side: Secondary | ICD-10-CM | POA: Diagnosis present

## 2022-12-04 DIAGNOSIS — M5431 Sciatica, right side: Secondary | ICD-10-CM | POA: Diagnosis present

## 2022-12-04 DIAGNOSIS — N179 Acute kidney failure, unspecified: Secondary | ICD-10-CM | POA: Diagnosis present

## 2022-12-04 DIAGNOSIS — R531 Weakness: Secondary | ICD-10-CM | POA: Diagnosis not present

## 2022-12-04 DIAGNOSIS — M6281 Muscle weakness (generalized): Secondary | ICD-10-CM | POA: Diagnosis not present

## 2022-12-04 DIAGNOSIS — Y92019 Unspecified place in single-family (private) house as the place of occurrence of the external cause: Secondary | ICD-10-CM | POA: Diagnosis not present

## 2022-12-04 DIAGNOSIS — S22088A Other fracture of T11-T12 vertebra, initial encounter for closed fracture: Secondary | ICD-10-CM | POA: Diagnosis not present

## 2022-12-04 DIAGNOSIS — R41841 Cognitive communication deficit: Secondary | ICD-10-CM | POA: Diagnosis not present

## 2022-12-04 DIAGNOSIS — E785 Hyperlipidemia, unspecified: Secondary | ICD-10-CM | POA: Diagnosis present

## 2022-12-04 DIAGNOSIS — W01190A Fall on same level from slipping, tripping and stumbling with subsequent striking against furniture, initial encounter: Secondary | ICD-10-CM | POA: Diagnosis present

## 2022-12-04 DIAGNOSIS — E861 Hypovolemia: Secondary | ICD-10-CM | POA: Diagnosis present

## 2022-12-04 DIAGNOSIS — Z853 Personal history of malignant neoplasm of breast: Secondary | ICD-10-CM | POA: Diagnosis not present

## 2022-12-04 DIAGNOSIS — Z6834 Body mass index (BMI) 34.0-34.9, adult: Secondary | ICD-10-CM | POA: Diagnosis not present

## 2022-12-04 DIAGNOSIS — E875 Hyperkalemia: Secondary | ICD-10-CM | POA: Diagnosis present

## 2022-12-04 DIAGNOSIS — K219 Gastro-esophageal reflux disease without esophagitis: Secondary | ICD-10-CM | POA: Diagnosis present

## 2022-12-04 DIAGNOSIS — M2578 Osteophyte, vertebrae: Secondary | ICD-10-CM | POA: Diagnosis present

## 2022-12-04 DIAGNOSIS — D649 Anemia, unspecified: Secondary | ICD-10-CM | POA: Diagnosis present

## 2022-12-04 DIAGNOSIS — D849 Immunodeficiency, unspecified: Secondary | ICD-10-CM | POA: Diagnosis not present

## 2022-12-04 DIAGNOSIS — S22080A Wedge compression fracture of T11-T12 vertebra, initial encounter for closed fracture: Secondary | ICD-10-CM | POA: Diagnosis present

## 2022-12-04 DIAGNOSIS — I10 Essential (primary) hypertension: Secondary | ICD-10-CM | POA: Diagnosis present

## 2022-12-04 LAB — BASIC METABOLIC PANEL
Anion gap: 7 (ref 5–15)
BUN: 36 mg/dL — ABNORMAL HIGH (ref 8–23)
CO2: 19 mmol/L — ABNORMAL LOW (ref 22–32)
Calcium: 8.7 mg/dL — ABNORMAL LOW (ref 8.9–10.3)
Chloride: 98 mmol/L (ref 98–111)
Creatinine, Ser: 1.48 mg/dL — ABNORMAL HIGH (ref 0.44–1.00)
GFR, Estimated: 35 mL/min — ABNORMAL LOW (ref >60)
Glucose, Bld: 147 mg/dL — ABNORMAL HIGH (ref 70–99)
Potassium: 5.9 mmol/L — ABNORMAL HIGH (ref 3.5–5.1)
Sodium: 124 mmol/L — ABNORMAL LOW (ref 135–145)

## 2022-12-04 LAB — CBC
HCT: 30.7 % — ABNORMAL LOW (ref 36.0–46.0)
Hemoglobin: 10.4 g/dL — ABNORMAL LOW (ref 12.0–15.0)
MCH: 32.1 pg (ref 26.0–34.0)
MCHC: 33.9 g/dL (ref 30.0–36.0)
MCV: 94.8 fL (ref 80.0–100.0)
Platelets: 199 10*3/uL (ref 150–400)
RBC: 3.24 MIL/uL — ABNORMAL LOW (ref 3.87–5.11)
RDW: 13 % (ref 11.5–15.5)
WBC: 10.8 10*3/uL — ABNORMAL HIGH (ref 4.0–10.5)
nRBC: 0 % (ref 0.0–0.2)

## 2022-12-04 MED ORDER — HYDROCODONE-ACETAMINOPHEN 5-325 MG PO TABS
2.0000 | ORAL_TABLET | Freq: Four times a day (QID) | ORAL | Status: DC | PRN
  Administered 2022-12-04 – 2022-12-08 (×11): 2 via ORAL
  Filled 2022-12-04 (×12): qty 2

## 2022-12-04 MED ORDER — MELATONIN 3 MG PO TABS
3.0000 mg | ORAL_TABLET | Freq: Every day | ORAL | Status: DC
  Administered 2022-12-04 – 2022-12-07 (×4): 3 mg via ORAL
  Filled 2022-12-04 (×4): qty 1

## 2022-12-04 MED ORDER — PROCHLORPERAZINE EDISYLATE 10 MG/2ML IJ SOLN
10.0000 mg | Freq: Four times a day (QID) | INTRAMUSCULAR | Status: DC | PRN
  Administered 2022-12-04: 10 mg via INTRAVENOUS
  Filled 2022-12-04: qty 2

## 2022-12-04 MED ORDER — POLYETHYLENE GLYCOL 3350 17 G PO PACK
17.0000 g | PACK | Freq: Two times a day (BID) | ORAL | Status: AC
  Administered 2022-12-04 – 2022-12-05 (×4): 17 g via ORAL
  Filled 2022-12-04 (×4): qty 1

## 2022-12-04 MED ORDER — TIRZEPATIDE 5 MG/0.5ML ~~LOC~~ SOAJ
5.0000 mg | SUBCUTANEOUS | Status: DC
  Administered 2022-12-04: 5 mg via SUBCUTANEOUS

## 2022-12-04 MED ORDER — QUETIAPINE FUMARATE 25 MG PO TABS
25.0000 mg | ORAL_TABLET | Freq: Every evening | ORAL | Status: DC | PRN
  Administered 2022-12-07: 25 mg via ORAL
  Filled 2022-12-04: qty 1

## 2022-12-04 MED ORDER — POLYETHYLENE GLYCOL 3350 17 G PO PACK: 17.0000 g | PACK | Freq: Every day | ORAL | Status: DC | PRN

## 2022-12-04 MED ORDER — SODIUM CHLORIDE 0.9 % IV SOLN: INTRAVENOUS | Status: AC

## 2022-12-04 MED ORDER — ENOXAPARIN SODIUM 40 MG/0.4ML IJ SOSY
40.0000 mg | PREFILLED_SYRINGE | INTRAMUSCULAR | Status: DC
  Administered 2022-12-04 – 2022-12-07 (×4): 40 mg via SUBCUTANEOUS
  Filled 2022-12-04 (×4): qty 0.4

## 2022-12-04 MED ORDER — SODIUM ZIRCONIUM CYCLOSILICATE 10 G PO PACK
10.0000 g | PACK | Freq: Two times a day (BID) | ORAL | Status: AC
  Administered 2022-12-04 (×2): 10 g via ORAL
  Filled 2022-12-04 (×2): qty 1

## 2022-12-04 NOTE — NC FL2 (Signed)
[place] MEDICAID FL2 LEVEL OF CARE FORM     IDENTIFICATION  Patient Name: Dawn Bradley Birthdate: 07-May-1941 Sex: female Admission Date (Current Location): 12/03/2022  [facility] and [place] Number:  Producer, television/film/video and Address:  [facility],  [address] [place], [place] [address]      Provider Number: [id number]  Attending Physician Name and Address:  Marinda Elk, MD  Relative Name and Phone Number:  Terriah Zielsdorf (spouse) Ph: [phone]    Current Level of Care: Hospital Recommended Level of Care: Skilled Nursing Facility Prior Approval Number:    Date Approved/Denied:   PASRR Number: [id number]  Discharge Plan: SNF    Current Diagnoses: Patient Active Problem List   Diagnosis Date Noted   Closed left hip fracture (HCC) 12/04/2022   Thoracic spine fracture (HCC) 12/03/2022   Hyponatremia 12/03/2022   Hyperkalemia 12/03/2022   AKI (acute kidney injury) (HCC) 12/03/2022   Hypothyroidism 12/03/2022   Obesity (BMI 30-39.9) 12/03/2022   History of carcinoma 12/03/2022   Leukocytosis 12/03/2022   Ductal carcinoma in situ (DCIS) of left breast 10/31/2014   Steatosis of liver 10/31/2014   BMI 37.0-37.9, adult 10/31/2014   Acute mastitis of left breast 09/13/2012    Orientation RESPIRATION BLADDER Height & Weight     Self, Time, Situation, Place  Normal Continent Weight: 200 lb (90.7 kg) Height:  5\' 4"  (162.6 cm)  BEHAVIORAL SYMPTOMS/MOOD NEUROLOGICAL BOWEL NUTRITION STATUS   (N/A)  (N/A) Continent Diet (Regular diet)  AMBULATORY STATUS COMMUNICATION OF NEEDS Skin   Limited Assist Verbally Other (Comment) (Ecchymosis: left shoulder and leg)                       Personal Care Assistance Level of Assistance  Bathing, Feeding, Dressing Bathing Assistance: Limited assistance Feeding assistance: Independent Dressing Assistance: Limited assistance     Functional Limitations Info  Sight, Hearing, Speech Sight Info:  Impaired Hearing Info: Adequate Speech Info: Adequate    SPECIAL CARE FACTORS FREQUENCY  PT (By licensed PT), OT (By licensed OT)     PT Frequency: 5x's/week OT Frequency: 5x's/week            Contractures Contractures Info: Not present    Additional Factors Info  Code Status, Allergies Code Status Info: Full Allergies Info: Morphine, Pholcodine, Shellfish Allergy, Shellfish-derived Products, Codeine, Latex, Other, Tramadol           Current Medications (12/04/2022):  This is the current hospital active medication list Current Facility-Administered Medications  Medication Dose Route Frequency Provider Last Rate Last Admin   0.9 %  sodium chloride infusion   Intravenous Continuous Marinda Elk, MD 75 mL/hr at 12/04/22 0804 New Bag at 12/04/22 0804   acetaminophen (TYLENOL) tablet 650 mg  650 mg Oral Q6H PRN Tu, Ching T, DO       enoxaparin (LOVENOX) injection 30 mg  30 mg Subcutaneous Q24H Tu, Ching T, DO   30 mg at 12/03/22 2206   HYDROcodone-acetaminophen (NORCO/VICODIN) 5-325 MG per tablet 2 tablet  2 tablet Oral Q6H PRN Marinda Elk, MD   2 tablet at 12/04/22 1109   HYDROmorphone (DILAUDID) injection 0.5 mg  0.5 mg Intravenous Q4H PRN Tu, Ching T, DO   0.5 mg at 12/04/22 0925   levothyroxine (SYNTHROID) tablet 100 mcg  100 mcg Oral Q0600 Tu, Ching T, DO   100 mcg at 12/04/22 0526   lidocaine (LIDODERM) 5 % 1 patch  1 patch Transdermal Q24H  Benita Gutter T, DO   1 patch at 12/03/22 2332   methocarbamol (ROBAXIN) tablet 500 mg  500 mg Oral TID Tu, Ching T, DO   500 mg at 12/04/22 1109   ondansetron (ZOFRAN) injection 4 mg  4 mg Intravenous Q6H PRN Tu, Ching T, DO   4 mg at 12/04/22 1109   polyethylene glycol (MIRALAX / GLYCOLAX) packet 17 g  17 g Oral BID Marinda Elk, MD   17 g at 12/04/22 1109   polyethylene glycol (MIRALAX / GLYCOLAX) packet 17 g  17 g Oral Daily PRN Marinda Elk, MD       senna-docusate (Senokot-S) tablet 1 tablet  1 tablet  Oral QHS Tu, Ching T, DO       simvastatin (ZOCOR) tablet 20 mg  20 mg Oral QHS Tu, Ching T, DO   20 mg at 12/03/22 2206   sodium zirconium cyclosilicate (LOKELMA) packet 10 g  10 g Oral BID Marinda Elk, MD   10 g at 12/04/22 0758   zolpidem (AMBIEN) tablet 5 mg  5 mg Oral QHS PRN Tu, Ching T, DO   5 mg at 12/04/22 0149     Discharge Medications: Please see discharge summary for a list of discharge medications.  Relevant Imaging Results:  Relevant Lab Results:   Additional Information SSN: [id number]  Ewing Schlein, LCSW

## 2022-12-04 NOTE — Plan of Care (Signed)
  Problem: Education: Goal: Knowledge of General Education information will improve Description: Including pain rating scale, medication(s)/side effects and non-pharmacologic comfort measures Outcome: Progressing   Problem: Pain Managment: Goal: General experience of comfort will improve Outcome: Progressing   Problem: Safety: Goal: Ability to remain free from injury will improve Outcome: Progressing   

## 2022-12-04 NOTE — Evaluation (Signed)
Physical Therapy Evaluation Patient Details Name: Dawn Bradley MRN: [id number] DOB: 01-27-1941 Today's Date: 12/04/2022  History of Present Illness  82 y.o. 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  Larey Seat face first and landed on her side and mid back denies any prodromal symptoms.  Imaging showed a T11 acute fracture  Clinical Impression  Pt admitted with above diagnosis. Mod assist for log roll and then for sidelying to sit, Min assist sit to stand from elevated surface. Pt ambulated 48' with RW, distance limited by pain and nausea.  Pt currently with functional limitations due to the deficits listed below (see PT Problem List). Pt will benefit from acute skilled PT to increase their independence and safety with mobility to allow discharge.          Recommendations for follow up therapy are one component of a multi-disciplinary discharge planning process, led by the attending physician.  Recommendations may be updated based on patient status, additional functional criteria and insurance authorization.  Follow Up Recommendations Can patient physically be transported by private vehicle: No     Assistance Recommended at Discharge Intermittent Supervision/Assistance  Patient can return home with the following  A lot of help with walking and/or transfers;A lot of help with bathing/dressing/bathroom;Assistance with cooking/housework;Assist for transportation;Help with stairs or ramp for entrance    Equipment Recommendations BSC/3in1  Recommendations for Other Services       Functional Status Assessment Patient has had a recent decline in their functional status and demonstrates the ability to make significant improvements in function in a reasonable and predictable  amount of time.     Precautions / Restrictions Precautions Precautions: Fall;Back Precaution Comments: fell just prior to admission, denies other falls in past 6 months; instructed pt in log roll Required Braces or Orthoses: Spinal Brace Spinal Brace: Lumbar corset Restrictions Weight Bearing Restrictions: No      Mobility  Bed Mobility Overal bed mobility: Needs Assistance Bed Mobility: Rolling, Sidelying to Sit Rolling: Mod assist Sidelying to sit: Mod assist       General bed mobility comments: VCs technique for log roll, assist to initiate roll, then assist to raise trunk    Transfers Overall transfer level: Needs assistance Equipment used: Rolling walker (2 wheels) Transfers: Sit to/from Stand Sit to Stand: Min assist, From elevated surface           General transfer comment: VCs hand placement    Ambulation/Gait Ambulation/Gait assistance: Min guard Gait Distance (Feet): 65 Feet Assistive device: Rolling walker (2 wheels) Gait Pattern/deviations: Step-through pattern, Decreased stride length Gait velocity: decr     General Gait Details: steady, no loss of balance, distance limited by pain and nausea  Stairs            Wheelchair Mobility    Modified Rankin (Stroke Patients Only)       Balance Overall balance assessment: Needs assistance, History of Falls Sitting-balance support: Feet supported, No upper extremity supported Sitting balance-Leahy Scale: Good     Standing balance support: Bilateral upper extremity supported, During functional activity, No upper extremity supported Standing balance-Leahy Scale: Poor                               Pertinent Vitals/Pain Pain Assessment Pain Assessment:  0-10 Pain Score: 8  Pain Location: back Pain Descriptors / Indicators: Sore Pain Intervention(s): Limited activity within patient's tolerance, Monitored during session, Premedicated before session    Home Living Family/patient  expects to be discharged to:: Private residence Living Arrangements: Spouse/significant other Available Help at Discharge: Family;Available 24 hours/day   Home Access: Stairs to enter   Entrance Stairs-Number of Steps: 5 Alternate Level Stairs-Number of Steps: flight Home Layout: Two level;Able to live on main level with bedroom/bathroom Home Equipment: Rolling Walker (2 wheels);Cane - single point      Prior Function Prior Level of Function : Independent/Modified Independent             Mobility Comments: was using cane or RW 2* sciatica pain in RLE; denies falls in past 6 months ADLs Comments: independent     Hand Dominance        Extremity/Trunk Assessment   Upper Extremity Assessment Upper Extremity Assessment: Overall WFL for tasks assessed    Lower Extremity Assessment Lower Extremity Assessment: Overall WFL for tasks assessed    Cervical / Trunk Assessment Cervical / Trunk Assessment: Normal  Communication   Communication: No difficulties  Cognition Arousal/Alertness: Awake/alert Behavior During Therapy: WFL for tasks assessed/performed Overall Cognitive Status: Within Functional Limits for tasks assessed                                          General Comments      Exercises     Assessment/Plan    PT Assessment Patient needs continued PT services  PT Problem List Decreased activity tolerance;Decreased balance;Decreased mobility;Pain;Obesity;Decreased knowledge of use of DME;Decreased knowledge of precautions       PT Treatment Interventions Gait training;Therapeutic activities;Functional mobility training;Patient/family education;Therapeutic exercise;DME instruction    PT Goals (Current goals can be found in the Care Plan section)  Acute Rehab PT Goals Patient Stated Goal: to get stronger PT Goal Formulation: With patient Time For Goal Achievement: 12/18/22 Potential to Achieve Goals: Good    Frequency Min 1X/week      Co-evaluation               AM-PAC PT "6 Clicks" Mobility  Outcome Measure Help needed turning from your back to your side while in a flat bed without using bedrails?: A Lot Help needed moving from lying on your back to sitting on the side of a flat bed without using bedrails?: A Lot Help needed moving to and from a bed to a chair (including a wheelchair)?: A Lot Help needed standing up from a chair using your arms (e.g., wheelchair or bedside chair)?: A Lot Help needed to walk in hospital room?: A Little Help needed climbing 3-5 steps with a railing? : A Lot 6 Click Score: 13    End of Session Equipment Utilized During Treatment: Gait belt;Back brace Activity Tolerance: Patient limited by pain Patient left: with chair alarm set;in chair;with call bell/phone within reach;with nursing/sitter in room Nurse Communication: Mobility status PT Visit Diagnosis: Difficulty in walking, not elsewhere classified (R26.2);Pain    Time: 1025-1057 PT Time Calculation (min) (ACUTE ONLY): 32 min   Charges:   PT Evaluation $PT Eval Moderate Complexity: 1 Mod PT Treatments $Gait Training: 8-22 mins       Ralene Bathe Kistler PT 12/04/2022  Acute Rehabilitation Services  Office [phone]

## 2022-12-04 NOTE — TOC Initial Note (Signed)
Transition of Care [facility]) - Initial/Assessment Note   Patient Details  Name: Dawn Bradley MRN: [id number] Date of Birth: [birth year]  Transition of Care [facility]) CM/SW Contact:    Ewing Schlein, LCSW Phone Number: 12/04/2022, 11:15 AM  Clinical Narrative: PT evaluation recommended SNF. Patient is agreeable to rehab and is aware she will need to go under the [facility] waiver, which will limit the number of facilities she can be referred to as she is under observation. FL2 done; PASRR confirmed. Initial referral faxed out. TOC awaiting bed offers.                 Expected Discharge Plan: Skilled Nursing Facility Barriers to Discharge: Continued Medical Work up, SNF Pending bed offer  Patient Goals and CMS Choice Patient states their goals for this hospitalization and ongoing recovery are:: Go to rehab before returning home CMS Medicare.gov Compare Post Acute Care list provided to:: Patient Choice offered to / list presented to : Patient  Expected Discharge Plan and Services In-house Referral: Clinical Social Work Post Acute Care Choice: Skilled Nursing Facility Living arrangements for the past 2 months: Single Family Home           DME Arranged: N/A DME Agency: NA  Prior Living Arrangements/Services Living arrangements for the past 2 months: Single Family Home Lives with:: Spouse Patient language and need for interpreter reviewed:: Yes Do you feel safe going back to the place where you live?: Yes      Need for Family Participation in Patient Care: No (Comment) Care giver support system in place?: Yes (comment) Criminal Activity/Legal Involvement Pertinent to Current Situation/Hospitalization: No - Comment as needed  Activities of Daily Living Home Assistive Devices/Equipment: Cane (specify quad or straight), Walker (specify type) (two front wheel) ADL Screening (condition at time of admission) Patient's cognitive ability adequate to safely complete daily activities?: Yes Is the  patient deaf or have difficulty hearing?: No Does the patient have difficulty seeing, even when wearing glasses/contacts?: No Does the patient have difficulty concentrating, remembering, or making decisions?: No Patient able to express need for assistance with ADLs?: Yes Does the patient have difficulty dressing or bathing?: Yes Independently performs ADLs?: No Communication: Independent Dressing (OT): Needs assistance Is this a change from baseline?: Change from baseline, expected to last >3 days Grooming: Independent Feeding: Independent Bathing: Needs assistance Is this a change from baseline?: Change from baseline, expected to last >3 days Toileting: Needs assistance Is this a change from baseline?: Change from baseline, expected to last >3days In/Out Bed: Needs assistance Is this a change from baseline?: Change from baseline, expected to last >3 days Walks in Home: Needs assistance Is this a change from baseline?: Change from baseline, expected to last >3 days Does the patient have difficulty walking or climbing stairs?: Yes Weakness of Legs: Both Weakness of Arms/Hands: None  Permission Sought/Granted Permission sought to share information with : Facility Industrial/product designer granted to share information with : Yes, Verbal Permission Granted Permission granted to share info w AGENCY: SNFs  Emotional Assessment Appearance:: Appears stated age Attitude/Demeanor/Rapport: Engaged Affect (typically observed): Accepting Orientation: : Oriented to Self, Oriented to Place, Oriented to  Time, Oriented to Situation Alcohol / Substance Use: Not Applicable Psych Involvement: No (comment)  Admission diagnosis:  Hyperkalemia [E87.5] Hyponatremia [E87.1] Thoracic spine fracture (HCC) [S22.009A] Fall, initial encounter [W19.XXXA] Closed fracture of body of posterior thoracic vertebra, initial encounter (HCC) [S22.009A] Closed left hip fracture (HCC) [S72.002A] Patient  Active Problem List  Diagnosis Date Noted   Closed left hip fracture (HCC) 12/04/2022   Thoracic spine fracture (HCC) 12/03/2022   Hyponatremia 12/03/2022   Hyperkalemia 12/03/2022   AKI (acute kidney injury) (HCC) 12/03/2022   Hypothyroidism 12/03/2022   Obesity (BMI 30-39.9) 12/03/2022   History of carcinoma 12/03/2022   Leukocytosis 12/03/2022   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 [facility] [address] [place] [address] Phone: [phone] Fax: [phone]  Social Determinants of Health (SDOH) Social History: SDOH Screenings   Food Insecurity: No Food Insecurity (12/03/2022)  Housing: Low Risk  (12/03/2022)  Transportation Needs: No Transportation Needs (12/04/2022)  Utilities: Not At Risk (12/04/2022)  Tobacco Use: Medium Risk (12/03/2022)   SDOH Interventions:    Readmission Risk Interventions     No data to display

## 2022-12-04 NOTE — Progress Notes (Signed)
[facility] PROGRESS NOTE    Progress Note  Dawn Bradley  NWG:[id number] DOB: [birth year] DOA: 12/03/2022 PCP: Geoffry Paradise, MD     Brief Narrative:   Dawn Bradley is an 82 y.o. 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  Larey Seat face first and landed on her side and mid back denies any prodromal symptoms.  Imaging showed a T11 acute fracture discussed with neurosurgery who does not recommend any further intervention DLCO pain manage and physical therapy   Assessment/Plan:   Thoracic spine fracture (HCC) Acute T11 fracture. Evaluated by Dr. Franky Macho does not feel she would benefit from MRI or intervention. Needs to follow-up with neurosurgery as an outpatient. Recommended LCO brace. Pain control and physical therapy evaluation.  Hyperkalemia: Started on IV fluids and Lokelma. Potassium is still elevated give 2 doses of Lokelma recheck basic metabolic panel in the morning.  Acute kidney injury/with a solitary kidney status post nephrectomy: Holding ACE inhibitor and diuretic therapy. Started on IV fluids creatinine this morning is 1.4, unknown baseline.  Normal anion gap metabolic acidosis: Probably contributing to her hyperkalemia as it has normalized with normal saline. Continue IV fluids.  Leukocytosis: Likely reactive. History of carcinoma: Follows at [facility] as surveillance cystoscopy in January 2024.  Obesity: Noted.  Hypothyroidism: Continue Synthroid.  Hypovolemic hyponatremia: Improving with IV fluid hydration.  Normocytic anemia: Follow-up with PCP as an outpatient.   DVT prophylaxis: lovenox Family Communication: Husband Status is: Observation The patient remains OBS appropriate and will d/c before 2  midnights.    Code Status:     Code Status Orders  (From admission, onward)           Start     Ordered   12/03/22 2156  Full code  Continuous       Question:  By:  Answer:  Consent: discussion documented in EHR   12/03/22 2156           Code Status History     This patient has a current code status but no historical code status.         IV Access:   Peripheral IV   Procedures and diagnostic studies:   CT Thoracic Spine Wo Contrast  Result Date: 12/03/2022 CLINICAL DATA:  Back trauma, no prior imaging (Age >= 16y) mid-thoracic spinal tenderness after fall; Back trauma, no prior imaging (Age >= 16y) EXAM: CT Thoracic and Lumbar spine none contrast TECHNIQUE: Multiplanar CT images of the thoracic and lumbar spine were reconstructed from contemporary CT of the Chest, Abdomen, and Pelvis. RADIATION DOSE REDUCTION: This exam was performed according to the departmental dose-optimization program which includes automated exposure control, adjustment of the mA and/or kV according to patient size and/or use of iterative reconstruction technique. CONTRAST:  None or No additional COMPARISON:  MRI lumbar spine 03/12/2022, CT abdomen pelvis 07/26/2014 FINDINGS: CT THORACIC SPINE FINDINGS Alignment: Normal. Vertebrae: Multilevel degenerative changes spine with continues osteophyte formation with a cortical break through the T11 vertebral body osteophyte and fractures of the vertebral body. Paraspinal and other soft tissues: Negative. Disc levels: Grossly maintained CT LUMBAR SPINE FINDINGS Segmentation: 5 lumbar type vertebrae. Alignment: Stable grade 1 anterolisthesis of L5 on S1. Vertebrae: No acute fracture or focal pathologic process. Paraspinal and other soft tissues: Negative. Disc levels: Intervertebral disc space vacuum  phenomenon at the L4-L5 and L5-S1 levels. Other: Atherosclerotic plaque of the aorta.  Colonic diverticulosis. IMPRESSION: 1. Acute T11 fracture through the  continuous osteophytes and vertebral body. Recommend MRI for further evaluation of the ligaments. 2. No acute displaced fracture or traumatic listhesis of the lumbar spine. 3.  Aortic Atherosclerosis (ICD10-I70.0). Electronically Signed   By: Tish Frederickson M.D.   On: 12/03/2022 20:41   CT Lumbar Spine Wo Contrast  Result Date: 12/03/2022 CLINICAL DATA:  Back trauma, no prior imaging (Age >= 16y) mid-thoracic spinal tenderness after fall; Back trauma, no prior imaging (Age >= 16y) EXAM: CT Thoracic and Lumbar spine none contrast TECHNIQUE: Multiplanar CT images of the thoracic and lumbar spine were reconstructed from contemporary CT of the Chest, Abdomen, and Pelvis. RADIATION DOSE REDUCTION: This exam was performed according to the departmental dose-optimization program which includes automated exposure control, adjustment of the mA and/or kV according to patient size and/or use of iterative reconstruction technique. CONTRAST:  None or No additional COMPARISON:  MRI lumbar spine 03/12/2022, CT abdomen pelvis 07/26/2014 FINDINGS: CT THORACIC SPINE FINDINGS Alignment: Normal. Vertebrae: Multilevel degenerative changes spine with continues osteophyte formation with a cortical break through the T11 vertebral body osteophyte and fractures of the vertebral body. Paraspinal and other soft tissues: Negative. Disc levels: Grossly maintained CT LUMBAR SPINE FINDINGS Segmentation: 5 lumbar type vertebrae. Alignment: Stable grade 1 anterolisthesis of L5 on S1. Vertebrae: No acute fracture or focal pathologic process. Paraspinal and other soft tissues: Negative. Disc levels: Intervertebral disc space vacuum phenomenon at the L4-L5 and L5-S1 levels. Other: Atherosclerotic plaque of the aorta.  Colonic diverticulosis. IMPRESSION: 1. Acute T11 fracture through the continuous osteophytes and vertebral body. Recommend MRI for further evaluation of the ligaments. 2. No acute displaced fracture or traumatic listhesis of the  lumbar spine. 3.  Aortic Atherosclerosis (ICD10-I70.0). Electronically Signed   By: Tish Frederickson M.D.   On: 12/03/2022 20:41   DG HIP UNILAT WITH PELVIS 2-3 VIEWS RIGHT  Result Date: 12/03/2022 CLINICAL DATA:  Trauma, fall EXAM: DG HIP (WITH OR WITHOUT PELVIS) 2-3V RIGHT COMPARISON:  None Available. FINDINGS: No recent fracture or dislocation is seen. Small smooth marginated calcification is seen in the soft tissues lateral to the right iliac bone, possibly residual from previous ligament injury. Small bony spurs are seen in right hip. Degenerative changes are noted in lower lumbar spine. IMPRESSION: No recent fracture or dislocation is seen. Small bony spurs are seen in right hip. Lumbar spondylosis. Electronically Signed   By: Ernie Avena M.D.   On: 12/03/2022 20:41   CT Head Wo Contrast  Result Date: 12/03/2022 CLINICAL DATA:  Head trauma, moderate-severe; Polytrauma, blunt EXAM: CT HEAD WITHOUT CONTRAST CT CERVICAL SPINE WITHOUT CONTRAST TECHNIQUE: Multidetector CT imaging of the head and cervical spine was performed following the standard protocol without intravenous contrast. Multiplanar CT image reconstructions of the cervical spine were also generated. RADIATION DOSE REDUCTION: This exam was performed according to the departmental dose-optimization program which includes automated exposure control, adjustment of the mA and/or kV according to patient size and/or use of iterative reconstruction technique. COMPARISON:  None Available. FINDINGS: CT HEAD FINDINGS Brain: Cerebral ventricle sizes are concordant with the degree of cerebral volume loss. Patchy and confluent areas of decreased attenuation are noted throughout the deep and periventricular white matter of the cerebral hemispheres bilaterally, compatible with chronic microvascular ischemic disease. No evidence of large-territorial acute infarction. No parenchymal hemorrhage. No mass lesion. No extra-axial collection. No mass effect or  midline shift. No hydrocephalus. Basilar cisterns are patent. Vascular: No hyperdense vessel. Atherosclerotic calcifications are present within the cavernous internal carotid and vertebral arteries. Skull: No acute fracture or focal lesion. Sinuses/Orbits: Paranasal sinuses and mastoid air cells are clear. The orbits are unremarkable. Other: None. CT CERVICAL SPINE FINDINGS Alignment: Grade 1 anterolisthesis of C4 on C5. Skull base and vertebrae: Multilevel moderate degenerative change of the spine. No acute fracture. No aggressive appearing focal osseous lesion or focal pathologic process. Soft tissues and spinal canal: No prevertebral fluid or swelling. No visible canal hematoma. Upper chest: Unremarkable. Other: None. IMPRESSION: 1. No acute intracranial abnormality. 2. No acute displaced fracture or traumatic listhesis of the cervical spine. Electronically Signed   By: Tish Frederickson M.D.   On: 12/03/2022 20:36   CT Cervical Spine Wo Contrast  Result Date: 12/03/2022 CLINICAL DATA:  Head trauma, moderate-severe; Polytrauma, blunt EXAM: CT HEAD WITHOUT CONTRAST CT CERVICAL SPINE WITHOUT CONTRAST TECHNIQUE: Multidetector CT imaging of the head and cervical spine was performed following the standard protocol without intravenous contrast. Multiplanar CT image reconstructions of the cervical spine were also generated. RADIATION DOSE REDUCTION: This exam was performed according to the departmental dose-optimization program which includes automated exposure control, adjustment of the mA and/or kV according to patient size and/or use of iterative reconstruction technique. COMPARISON:  None Available. FINDINGS: CT HEAD FINDINGS Brain: Cerebral ventricle sizes are concordant with the degree of cerebral volume loss. Patchy and confluent areas of decreased attenuation are noted throughout the deep and periventricular white matter of the cerebral hemispheres bilaterally, compatible with chronic microvascular ischemic  disease. No evidence of large-territorial acute infarction. No parenchymal hemorrhage. No mass lesion. No extra-axial collection. No mass effect or midline shift. No hydrocephalus. Basilar cisterns are patent. Vascular: No hyperdense vessel. Atherosclerotic calcifications are present within the cavernous internal carotid and vertebral arteries. Skull: No acute fracture or focal lesion. Sinuses/Orbits: Paranasal sinuses and mastoid air cells are clear. The orbits are unremarkable. Other: None. CT CERVICAL SPINE FINDINGS Alignment: Grade 1 anterolisthesis of C4 on C5. Skull base and vertebrae: Multilevel moderate degenerative change of the spine. No acute fracture. No aggressive appearing focal osseous lesion or focal pathologic process. Soft tissues and spinal canal: No prevertebral fluid or swelling. No visible canal hematoma. Upper chest: Unremarkable. Other: None. IMPRESSION: 1. No acute intracranial abnormality. 2. No acute displaced fracture or traumatic listhesis of the cervical spine. Electronically Signed   By: Tish Frederickson M.D.   On: 12/03/2022 20:36     Medical Consultants:   None.   Subjective:    Dawn Bradley pain is controlled has not had a bowel movement 2 days  Objective:    Vitals:   12/03/22 2152 12/03/22 2301 12/04/22 0113 12/04/22 0600  BP: (!) 133/50 (!) 147/86 (!) 158/71 (!) 123/55  Pulse: 81 83 71 66  Resp: 11 16 15 15   Temp: (!) 97.4 F (36.3 C) 98.1 F (36.7 C) 98 F (36.7 C) 98.2 F (36.8 C)  TempSrc: Oral Oral Oral Oral  SpO2: 96% 99% 100% 99%  Weight:      Height:       SpO2: 99 %   Intake/Output Summary (Last 24 hours) at 12/04/2022 0738 Last data filed at 12/04/2022 0500 Gross per 24 hour  Intake 607.87 ml  Output 500 ml  Net 107.87 ml   Filed Weights   12/03/22 1915  Weight: 90.7 kg    Exam: General exam: In no acute distress. Respiratory system: Good air  movement and clear to auscultation. Cardiovascular system: S1 & S2 heard, RRR.  No JVD.  Gastrointestinal system: Abdomen is nondistended, soft and nontender.  Extremities: No pedal edema. Skin: No rashes, lesions or ulcers Psychiatry: Judgement and insight appear normal. Mood & affect appropriate.    Data Reviewed:    Labs: Basic Metabolic Panel: Recent Labs  Lab 12/03/22 2117 12/04/22 0348  NA 125* 124*  K 5.6* 5.9*  CL 97* 98  CO2 16* 19*  GLUCOSE 136* 147*  BUN 40* 36*  CREATININE 1.69* 1.48*  CALCIUM 9.1 8.7*   GFR Estimated Creatinine Clearance: 31.4 mL/min (A) (by C-G formula based on SCr of 1.48 mg/dL (H)). Liver Function Tests: No results for input(s): "AST", "ALT", "ALKPHOS", "BILITOT", "PROT", "ALBUMIN" in the last 168 hours. No results for input(s): "LIPASE", "AMYLASE" in the last 168 hours. No results for input(s): "AMMONIA" in the last 168 hours. Coagulation profile No results for input(s): "INR", "PROTIME" in the last 168 hours. COVID-19 Labs  No results for input(s): "DDIMER", "FERRITIN", "LDH", "CRP" in the last 72 hours.  No results found for: "SARSCOV2NAA"  CBC: Recent Labs  Lab 12/03/22 2117 12/04/22 0348  WBC 12.9* 10.8*  NEUTROABS 9.9*  --   HGB 11.3* 10.4*  HCT 33.2* 30.7*  MCV 93.3 94.8  PLT 247 199   Cardiac Enzymes: No results for input(s): "CKTOTAL", "CKMB", "CKMBINDEX", "TROPONINI" in the last 168 hours. BNP (last 3 results) No results for input(s): "PROBNP" in the last 8760 hours. CBG: No results for input(s): "GLUCAP" in the last 168 hours. D-Dimer: No results for input(s): "DDIMER" in the last 72 hours. Hgb A1c: No results for input(s): "HGBA1C" in the last 72 hours. Lipid Profile: No results for input(s): "CHOL", "HDL", "LDLCALC", "TRIG", "CHOLHDL", "LDLDIRECT" in the last 72 hours. Thyroid function studies: No results for input(s): "TSH", "T4TOTAL", "T3FREE", "THYROIDAB" in the last 72 hours.  Invalid input(s): "FREET3" Anemia work up: No results for input(s): "VITAMINB12", "FOLATE", "FERRITIN",  "TIBC", "IRON", "RETICCTPCT" in the last 72 hours. Sepsis Labs: Recent Labs  Lab 12/03/22 2117 12/04/22 0348  WBC 12.9* 10.8*   Microbiology No results found for this or any previous visit (from the past 240 hour(s)).   Medications:    enoxaparin (LOVENOX) injection  30 mg Subcutaneous Q24H   levothyroxine  100 mcg Oral Q0600   lidocaine  1 patch Transdermal Q24H   methocarbamol  500 mg Oral TID   polyethylene glycol  17 g Oral Daily   senna-docusate  1 tablet Oral QHS   simvastatin  20 mg Oral QHS   Continuous Infusions:  sodium chloride 75 mL/hr at 12/03/22 2348      LOS: 0 days   Marinda Elk  [facility]  12/04/2022, 7:38 AM

## 2022-12-04 NOTE — Progress Notes (Signed)
Orthopedic Tech Progress Note Patient Details:  Dawn Bradley [birth year] [id number]  Patient ID: Dawn Bradley, female   DOB: October 26, 1940, 82 y.o.   MRN: [id number]  Dawn Bradley 12/04/2022, 9:18 AM LSO per Neuro Dr placed in room for patient when OOB

## 2022-12-04 NOTE — Plan of Care (Signed)
  Problem: Education: Goal: Knowledge of General Education information will improve Description: Including pain rating scale, medication(s)/side effects and non-pharmacologic comfort measures Outcome: Progressing   Problem: Clinical Measurements: Goal: Ability to maintain clinical measurements within normal limits will improve Outcome: Progressing   Problem: Nutrition: Goal: Adequate nutrition will be maintained Outcome: Progressing   Problem: Pain Managment: Goal: General experience of comfort will improve Outcome: Progressing   Problem: Safety: Goal: Ability to remain free from injury will improve Outcome: Progressing   

## 2022-12-05 ENCOUNTER — Other Ambulatory Visit (HOSPITAL_BASED_OUTPATIENT_CLINIC_OR_DEPARTMENT_OTHER): Payer: Self-pay

## 2022-12-05 DIAGNOSIS — S22009A Unspecified fracture of unspecified thoracic vertebra, initial encounter for closed fracture: Secondary | ICD-10-CM | POA: Diagnosis not present

## 2022-12-05 DIAGNOSIS — S22088A Other fracture of T11-T12 vertebra, initial encounter for closed fracture: Secondary | ICD-10-CM | POA: Diagnosis not present

## 2022-12-05 DIAGNOSIS — W19XXXA Unspecified fall, initial encounter: Secondary | ICD-10-CM | POA: Diagnosis not present

## 2022-12-05 DIAGNOSIS — E875 Hyperkalemia: Secondary | ICD-10-CM | POA: Diagnosis not present

## 2022-12-05 LAB — BASIC METABOLIC PANEL
Anion gap: 5 (ref 5–15)
BUN: 24 mg/dL — ABNORMAL HIGH (ref 8–23)
CO2: 20 mmol/L — ABNORMAL LOW (ref 22–32)
Calcium: 8.3 mg/dL — ABNORMAL LOW (ref 8.9–10.3)
Chloride: 105 mmol/L (ref 98–111)
Creatinine, Ser: 1.41 mg/dL — ABNORMAL HIGH (ref 0.44–1.00)
GFR, Estimated: 37 mL/min — ABNORMAL LOW (ref >60)
Glucose, Bld: 131 mg/dL — ABNORMAL HIGH (ref 70–99)
Potassium: 5.5 mmol/L — ABNORMAL HIGH (ref 3.5–5.1)
Sodium: 130 mmol/L — ABNORMAL LOW (ref 135–145)

## 2022-12-05 MED ORDER — SODIUM ZIRCONIUM CYCLOSILICATE 10 G PO PACK
10.0000 g | PACK | Freq: Two times a day (BID) | ORAL | Status: AC
  Administered 2022-12-05 (×2): 10 g via ORAL
  Filled 2022-12-05 (×2): qty 1

## 2022-12-05 NOTE — Plan of Care (Signed)

## 2022-12-05 NOTE — TOC Progression Note (Signed)
Transition of Care [facility]) - Progression Note   Patient Details  Name: Dawn Bradley MRN: [id number] Date of Birth: 1941/07/14  Transition of Care [facility]) CM/SW Contact  Ewing Schlein, LCSW Phone Number: 12/05/2022, 1:02 PM  Clinical Narrative: CSW provided patient and husband, Oletha Emshoff, with bed offers. Patient chose [facility]. CSW confirmed bed with [facility] in admissions. CSW followed up with [facility] regarding the waiver, but patient is not enrolled in [facility] at this time. [facility] will still be able to offer a bed after the 3 midnight inpatient stay; patient and husband aware. Hospitalist updated.  Expected Discharge Plan: Skilled Nursing Facility Barriers to Discharge: Continued Medical Work up, SNF Pending bed offer  Expected Discharge Plan and Services In-house Referral: Clinical Social Work Post Acute Care Choice: Skilled Nursing Facility Living arrangements for the past 2 months: Single Family Home           DME Arranged: N/A DME Agency: NA  Social Determinants of Health (SDOH) Interventions SDOH Screenings   Food Insecurity: No Food Insecurity (12/03/2022)  Housing: Low Risk  (12/03/2022)  Transportation Needs: No Transportation Needs (12/04/2022)  Utilities: Not At Risk (12/04/2022)  Tobacco Use: Medium Risk (12/03/2022)   Readmission Risk Interventions     No data to display

## 2022-12-05 NOTE — Progress Notes (Signed)
[facility] PROGRESS NOTE    Progress Note  Dawn Bradley  ZOX:[id number] DOB: 01/01/1941 DOA: 12/03/2022 PCP: Geoffry Paradise, MD     Brief Narrative:   Dawn Bradley is an 82 y.o. 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  Larey Seat face first and landed on her side and mid back denies any prodromal symptoms.  Imaging showed a T11 acute fracture discussed with neurosurgery who does not recommend any further intervention DLCO pain manage and physical therapy.   Assessment/Plan:   Cortical breakthrough T11 spine fracture (HCC) Acute T11 fracture. Needs to follow-up as an outpatient with neurosurgery as an outpatient. Recommended LCO brace. Pain control and physical therapy evaluation, she will need skilled nursing facility placement.  Hyperkalemia: Follow strict I's and O's, KVO IV fluids.   Continue Lokelma orally. Potassium still 5.5, recheck potassium in the morning.  Acute kidney injury/with a solitary kidney status post nephrectomy: Holding ACE inhibitor and diuretic therapy. Started on IV fluids creatinine this morning is 1.4, unknown baseline.  Normal anion gap metabolic acidosis: Probably contributing to her hyperkalemia as it has normalized with normal saline. Continue IV fluids.  Leukocytosis: Likely reactive.  History of carcinoma: Follows at [facility] as surveillance cystoscopy in January 2024.  Obesity: Noted.  Hypothyroidism: Continue Synthroid.  Hypovolemic hyponatremia: Improved with IV fluids.  Normocytic anemia: Follow-up with PCP as an outpatient.   DVT prophylaxis: lovenox Family Communication: Husband Status is: Observation The patient remains OBS appropriate and will d/c before 2 midnights.    Code  Status:     Code Status Orders  (From admission, onward)           Start     Ordered   12/03/22 2156  Full code  Continuous       Question:  By:  Answer:  Consent: discussion documented in EHR   12/03/22 2156           Code Status History     This patient has a current code status but no historical code status.         IV Access:   Peripheral IV   Procedures and diagnostic studies:   CT Thoracic Spine Wo Contrast  Result Date: 12/03/2022 CLINICAL DATA:  Back trauma, no prior imaging (Age >= 16y) mid-thoracic spinal tenderness after fall; Back trauma, no prior imaging (Age >= 16y) EXAM: CT Thoracic and Lumbar spine none contrast TECHNIQUE: Multiplanar CT images of the thoracic and lumbar spine were reconstructed from contemporary CT of the Chest, Abdomen, and Pelvis. RADIATION DOSE REDUCTION: This exam was performed according to the departmental dose-optimization program which includes automated exposure control, adjustment of the mA and/or kV according to patient size and/or use of iterative reconstruction technique. CONTRAST:  None or No additional COMPARISON:  MRI lumbar spine 03/12/2022, CT abdomen pelvis 07/26/2014 FINDINGS: CT THORACIC SPINE FINDINGS Alignment: Normal. Vertebrae: Multilevel degenerative changes spine with continues osteophyte formation with a cortical break through the T11 vertebral body osteophyte and fractures of the vertebral body. Paraspinal and other soft tissues: Negative. Disc levels: Grossly maintained CT LUMBAR SPINE FINDINGS Segmentation: 5 lumbar type vertebrae. Alignment: Stable grade 1 anterolisthesis of L5 on S1. Vertebrae: No acute fracture or focal pathologic process. Paraspinal and other soft tissues: Negative. Disc levels: Intervertebral disc space vacuum phenomenon at the  L4-L5 and L5-S1 levels. Other: Atherosclerotic plaque of the aorta.  Colonic diverticulosis. IMPRESSION: 1. Acute T11 fracture through the continuous osteophytes and  vertebral body. Recommend MRI for further evaluation of the ligaments. 2. No acute displaced fracture or traumatic listhesis of the lumbar spine. 3.  Aortic Atherosclerosis (ICD10-I70.0). Electronically Signed   By: Tish Frederickson M.D.   On: 12/03/2022 20:41   CT Lumbar Spine Wo Contrast  Result Date: 12/03/2022 CLINICAL DATA:  Back trauma, no prior imaging (Age >= 16y) mid-thoracic spinal tenderness after fall; Back trauma, no prior imaging (Age >= 16y) EXAM: CT Thoracic and Lumbar spine none contrast TECHNIQUE: Multiplanar CT images of the thoracic and lumbar spine were reconstructed from contemporary CT of the Chest, Abdomen, and Pelvis. RADIATION DOSE REDUCTION: This exam was performed according to the departmental dose-optimization program which includes automated exposure control, adjustment of the mA and/or kV according to patient size and/or use of iterative reconstruction technique. CONTRAST:  None or No additional COMPARISON:  MRI lumbar spine 03/12/2022, CT abdomen pelvis 07/26/2014 FINDINGS: CT THORACIC SPINE FINDINGS Alignment: Normal. Vertebrae: Multilevel degenerative changes spine with continues osteophyte formation with a cortical break through the T11 vertebral body osteophyte and fractures of the vertebral body. Paraspinal and other soft tissues: Negative. Disc levels: Grossly maintained CT LUMBAR SPINE FINDINGS Segmentation: 5 lumbar type vertebrae. Alignment: Stable grade 1 anterolisthesis of L5 on S1. Vertebrae: No acute fracture or focal pathologic process. Paraspinal and other soft tissues: Negative. Disc levels: Intervertebral disc space vacuum phenomenon at the L4-L5 and L5-S1 levels. Other: Atherosclerotic plaque of the aorta.  Colonic diverticulosis. IMPRESSION: 1. Acute T11 fracture through the continuous osteophytes and vertebral body. Recommend MRI for further evaluation of the ligaments. 2. No acute displaced fracture or traumatic listhesis of the lumbar spine. 3.  Aortic  Atherosclerosis (ICD10-I70.0). Electronically Signed   By: Tish Frederickson M.D.   On: 12/03/2022 20:41   DG HIP UNILAT WITH PELVIS 2-3 VIEWS RIGHT  Result Date: 12/03/2022 CLINICAL DATA:  Trauma, fall EXAM: DG HIP (WITH OR WITHOUT PELVIS) 2-3V RIGHT COMPARISON:  None Available. FINDINGS: No recent fracture or dislocation is seen. Small smooth marginated calcification is seen in the soft tissues lateral to the right iliac bone, possibly residual from previous ligament injury. Small bony spurs are seen in right hip. Degenerative changes are noted in lower lumbar spine. IMPRESSION: No recent fracture or dislocation is seen. Small bony spurs are seen in right hip. Lumbar spondylosis. Electronically Signed   By: Ernie Avena M.D.   On: 12/03/2022 20:41   CT Head Wo Contrast  Result Date: 12/03/2022 CLINICAL DATA:  Head trauma, moderate-severe; Polytrauma, blunt EXAM: CT HEAD WITHOUT CONTRAST CT CERVICAL SPINE WITHOUT CONTRAST TECHNIQUE: Multidetector CT imaging of the head and cervical spine was performed following the standard protocol without intravenous contrast. Multiplanar CT image reconstructions of the cervical spine were also generated. RADIATION DOSE REDUCTION: This exam was performed according to the departmental dose-optimization program which includes automated exposure control, adjustment of the mA and/or kV according to patient size and/or use of iterative reconstruction technique. COMPARISON:  None Available. FINDINGS: CT HEAD FINDINGS Brain: Cerebral ventricle sizes are concordant with the degree of cerebral volume loss. Patchy and confluent areas of decreased attenuation are noted throughout the deep and periventricular white matter of the cerebral hemispheres bilaterally, compatible with chronic microvascular ischemic disease. No evidence of large-territorial acute infarction. No parenchymal hemorrhage. No mass lesion. No extra-axial collection. No mass effect or midline shift. No  hydrocephalus. Basilar cisterns are patent. Vascular: No hyperdense vessel. Atherosclerotic calcifications are present within the cavernous internal carotid and vertebral arteries. Skull: No acute fracture or focal lesion. Sinuses/Orbits: Paranasal sinuses and mastoid air cells are clear. The orbits are unremarkable. Other: None. CT CERVICAL SPINE FINDINGS Alignment: Grade 1 anterolisthesis of C4 on C5. Skull base and vertebrae: Multilevel moderate degenerative change of the spine. No acute fracture. No aggressive appearing focal osseous lesion or focal pathologic process. Soft tissues and spinal canal: No prevertebral fluid or swelling. No visible canal hematoma. Upper chest: Unremarkable. Other: None. IMPRESSION: 1. No acute intracranial abnormality. 2. No acute displaced fracture or traumatic listhesis of the cervical spine. Electronically Signed   By: Tish Frederickson M.D.   On: 12/03/2022 20:36   CT Cervical Spine Wo Contrast  Result Date: 12/03/2022 CLINICAL DATA:  Head trauma, moderate-severe; Polytrauma, blunt EXAM: CT HEAD WITHOUT CONTRAST CT CERVICAL SPINE WITHOUT CONTRAST TECHNIQUE: Multidetector CT imaging of the head and cervical spine was performed following the standard protocol without intravenous contrast. Multiplanar CT image reconstructions of the cervical spine were also generated. RADIATION DOSE REDUCTION: This exam was performed according to the departmental dose-optimization program which includes automated exposure control, adjustment of the mA and/or kV according to patient size and/or use of iterative reconstruction technique. COMPARISON:  None Available. FINDINGS: CT HEAD FINDINGS Brain: Cerebral ventricle sizes are concordant with the degree of cerebral volume loss. Patchy and confluent areas of decreased attenuation are noted throughout the deep and periventricular white matter of the cerebral hemispheres bilaterally, compatible with chronic microvascular ischemic disease. No evidence  of large-territorial acute infarction. No parenchymal hemorrhage. No mass lesion. No extra-axial collection. No mass effect or midline shift. No hydrocephalus. Basilar cisterns are patent. Vascular: No hyperdense vessel. Atherosclerotic calcifications are present within the cavernous internal carotid and vertebral arteries. Skull: No acute fracture or focal lesion. Sinuses/Orbits: Paranasal sinuses and mastoid air cells are clear. The orbits are unremarkable. Other: None. CT CERVICAL SPINE FINDINGS Alignment: Grade 1 anterolisthesis of C4 on C5. Skull base and vertebrae: Multilevel moderate degenerative change of the spine. No acute fracture. No aggressive appearing focal osseous lesion or focal pathologic process. Soft tissues and spinal canal: No prevertebral fluid or swelling. No visible canal hematoma. Upper chest: Unremarkable. Other: None. IMPRESSION: 1. No acute intracranial abnormality. 2. No acute displaced fracture or traumatic listhesis of the cervical spine. Electronically Signed   By: Tish Frederickson M.D.   On: 12/03/2022 20:36     Medical Consultants:   None.   Subjective:    Weyman Croon [place] relate her pain is controlled has not had a bowel movement.  Objective:    Vitals:   12/04/22 1257 12/04/22 1816 12/04/22 2154 12/05/22 0547  BP: (!) 126/53 (!) 135/57 138/85 (!) 152/66  Pulse: (!) 55 66 81 65  Resp: 18 16 15 16   Temp: 98.5 F (36.9 C) 98.7 F (37.1 C) 98.3 F (36.8 C) 98.4 F (36.9 C)  TempSrc: Oral Oral Oral   SpO2: 100% 100% 99% 97%  Weight:      Height:       SpO2: 97 %   Intake/Output Summary (Last 24 hours) at 12/05/2022 0840 Last data filed at 12/05/2022 0600 Gross per 24 hour  Intake 2599.29 ml  Output 1700 ml  Net 899.29 ml    Filed Weights   12/03/22 1915  Weight: 90.7 kg    Exam: General exam: In no acute distress. Respiratory system: Good air movement and clear  to auscultation. Cardiovascular system: S1 & S2 heard, RRR. No  JVD. Gastrointestinal system: Abdomen is nondistended, soft and nontender.  Extremities: No pedal edema. Skin: No rashes, lesions or ulcers Psychiatry: Judgement and insight appear normal. Mood & affect appropriate.  Data Reviewed:    Labs: Basic Metabolic Panel: Recent Labs  Lab 12/03/22 2117 12/04/22 0348 12/05/22 0341  NA 125* 124* 130*  K 5.6* 5.9* 5.5*  CL 97* 98 105  CO2 16* 19* 20*  GLUCOSE 136* 147* 131*  BUN 40* 36* 24*  CREATININE 1.69* 1.48* 1.41*  CALCIUM 9.1 8.7* 8.3*    GFR Estimated Creatinine Clearance: 33 mL/min (A) (by C-G formula based on SCr of 1.41 mg/dL (H)). Liver Function Tests: No results for input(s): "AST", "ALT", "ALKPHOS", "BILITOT", "PROT", "ALBUMIN" in the last 168 hours. No results for input(s): "LIPASE", "AMYLASE" in the last 168 hours. No results for input(s): "AMMONIA" in the last 168 hours. Coagulation profile No results for input(s): "INR", "PROTIME" in the last 168 hours. COVID-19 Labs  No results for input(s): "DDIMER", "FERRITIN", "LDH", "CRP" in the last 72 hours.  No results found for: "SARSCOV2NAA"  CBC: Recent Labs  Lab 12/03/22 2117 12/04/22 0348  WBC 12.9* 10.8*  NEUTROABS 9.9*  --   HGB 11.3* 10.4*  HCT 33.2* 30.7*  MCV 93.3 94.8  PLT 247 199    Cardiac Enzymes: No results for input(s): "CKTOTAL", "CKMB", "CKMBINDEX", "TROPONINI" in the last 168 hours. BNP (last 3 results) No results for input(s): "PROBNP" in the last 8760 hours. CBG: No results for input(s): "GLUCAP" in the last 168 hours. D-Dimer: No results for input(s): "DDIMER" in the last 72 hours. Hgb A1c: No results for input(s): "HGBA1C" in the last 72 hours. Lipid Profile: No results for input(s): "CHOL", "HDL", "LDLCALC", "TRIG", "CHOLHDL", "LDLDIRECT" in the last 72 hours. Thyroid function studies: No results for input(s): "TSH", "T4TOTAL", "T3FREE", "THYROIDAB" in the last 72 hours.  Invalid input(s): "FREET3" Anemia work up: No results  for input(s): "VITAMINB12", "FOLATE", "FERRITIN", "TIBC", "IRON", "RETICCTPCT" in the last 72 hours. Sepsis Labs: Recent Labs  Lab 12/03/22 2117 12/04/22 0348  WBC 12.9* 10.8*    Microbiology No results found for this or any previous visit (from the past 240 hour(s)).   Medications:    enoxaparin (LOVENOX) injection  40 mg Subcutaneous Q24H   levothyroxine  100 mcg Oral Q0600   lidocaine  1 patch Transdermal Q24H   melatonin  3 mg Oral QHS   methocarbamol  500 mg Oral TID   polyethylene glycol  17 g Oral BID   senna-docusate  1 tablet Oral QHS   simvastatin  20 mg Oral QHS   tirzepatide  5 mg Subcutaneous Weekly   Continuous Infusions:  sodium chloride 75 mL/hr at 12/05/22 0059      LOS: 1 day   Marinda Elk  [facility]  12/05/2022, 8:40 AM

## 2022-12-06 DIAGNOSIS — W19XXXA Unspecified fall, initial encounter: Secondary | ICD-10-CM | POA: Diagnosis not present

## 2022-12-06 DIAGNOSIS — S22088A Other fracture of T11-T12 vertebra, initial encounter for closed fracture: Secondary | ICD-10-CM | POA: Diagnosis not present

## 2022-12-06 DIAGNOSIS — S22009A Unspecified fracture of unspecified thoracic vertebra, initial encounter for closed fracture: Secondary | ICD-10-CM | POA: Diagnosis not present

## 2022-12-06 DIAGNOSIS — E875 Hyperkalemia: Secondary | ICD-10-CM | POA: Diagnosis not present

## 2022-12-06 LAB — BASIC METABOLIC PANEL
Anion gap: 8 (ref 5–15)
BUN: 18 mg/dL (ref 8–23)
CO2: 22 mmol/L (ref 22–32)
Calcium: 8.8 mg/dL — ABNORMAL LOW (ref 8.9–10.3)
Chloride: 100 mmol/L (ref 98–111)
Creatinine, Ser: 1.28 mg/dL — ABNORMAL HIGH (ref 0.44–1.00)
GFR, Estimated: 42 mL/min — ABNORMAL LOW (ref >60)
Glucose, Bld: 139 mg/dL — ABNORMAL HIGH (ref 70–99)
Potassium: 4.7 mmol/L (ref 3.5–5.1)
Sodium: 130 mmol/L — ABNORMAL LOW (ref 135–145)

## 2022-12-06 MED ORDER — SORBITOL 70 % SOLN
960.0000 mL | TOPICAL_OIL | Freq: Once | ORAL | Status: AC
  Administered 2022-12-07: 960 mL via RECTAL
  Filled 2022-12-06: qty 240

## 2022-12-06 MED ORDER — POLYETHYLENE GLYCOL 3350 17 G PO PACK
17.0000 g | PACK | Freq: Two times a day (BID) | ORAL | Status: AC
  Administered 2022-12-06 – 2022-12-07 (×4): 17 g via ORAL
  Filled 2022-12-06 (×4): qty 1

## 2022-12-06 NOTE — Progress Notes (Signed)
Pt educated on role of PT, agreeable to session. ModA required for bed mobility and transfers with vc's for proper technique and spine precautions. LSO applied in standing due to abdominal distention which pt states is due to constipation, nursing aware. Pt completed gait training with support of RW and CGA, no LOB or nausea. Discussed POC and acute PT goals to be addressed until pt transitions to short term rehab prior to returning home. Pt will benefit from continued strengthening, safe transfer training, balance, and progressive gait in order to safely return home with assist from husband.    12/05/22 1200  PT Visit Information  Assistance Needed +1  History of Present Illness 82 y.o. 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  Larey Seat face first and landed on her side and mid back denies any prodromal symptoms.  Imaging showed a T11 acute fracture  Subjective Data  Subjective c/o fatigue  Patient Stated Goal to get stronger  Precautions  Precautions Fall;Back  Precaution Comments fell just prior to admission, denies other falls in past 6 months; instructed pt in log roll  Required Braces or Orthoses Spinal Brace  Spinal Brace Lumbar corset  Restrictions  Weight Bearing Restrictions No  Pain Assessment  Pain Assessment 0-10  Pain Score 6  Pain Location back  Pain Descriptors / Indicators Aching;Sore  Pain Intervention(s) Limited activity within patient's tolerance  Cognition  Arousal/Alertness Awake/alert  Behavior During Therapy WFL for tasks assessed/performed  Overall Cognitive Status Within Functional Limits for tasks assessed  General Comments Pleasant and cooperative  Bed Mobility  Overal bed mobility Needs Assistance  Bed Mobility Rolling;Sidelying to Sit   Rolling Mod assist  Sidelying to sit Mod assist  General bed mobility comments VCs technique for log roll, assist to initiate roll, then assist to raise trunk  Transfers  Overall transfer level Needs assistance  Equipment used Rolling walker (2 wheels)  Transfers Sit to/from Stand  Sit to Stand Min assist;From elevated surface  General transfer comment VCs hand placement  Ambulation/Gait  Ambulation/Gait assistance Min guard  Gait Distance (Feet)  (100)  Assistive device Rolling walker (2 wheels)  Gait Pattern/deviations Step-through pattern;Decreased stride length  General Gait Details steady, no loss of balance, distance limited by pain  Gait velocity decr  Balance  Overall balance assessment Needs assistance;History of Falls  Sitting-balance support Feet supported;No upper extremity supported  Sitting balance-Leahy Scale Good  Standing balance support Bilateral upper extremity supported;During functional activity;Reliant on assistive device for balance  Standing balance-Leahy Scale Fair  Standing balance comment Poor without assistive device  General Comments  General comments (skin integrity, edema, etc.) Pt educated on proper donning of LSO, wear time, and benefits  Exercises  Exercises General Lower Extremity  General Exercises - Lower Extremity  Ankle Circles/Pumps AROM;Both;10 reps  Long Arc Quad AROM;Both;10 reps  PT - End of Session  Equipment Utilized During Treatment Gait belt;Back brace  Activity Tolerance Patient limited by pain  Patient left in chair;with nursing/sitter in room (In bathroom for ADL's with nursing)  Nurse Communication Mobility status   PT - Assessment/Plan  PT Plan Current plan remains appropriate  PT Visit Diagnosis Difficulty in walking, not elsewhere classified (R26.2);Pain  Pain - part of body  (back)  PT Frequency (ACUTE ONLY) Min 1X/week  Follow Up Recommendations Skilled nursing-short  term rehab (<3 hours/day)  Can patient  physically be transported by private vehicle No  Assistance recommended at discharge Intermittent Supervision/Assistance  Patient can return home with the following A lot of help with walking and/or transfers;A lot of help with bathing/dressing/bathroom;Assistance with cooking/housework;Assist for transportation;Help with stairs or ramp for entrance  PT equipment BSC/3in1  AM-PAC PT "6 Clicks" Mobility Outcome Measure (Version 2)  Help needed turning from your back to your side while in a flat bed without using bedrails? 2  Help needed moving from lying on your back to sitting on the side of a flat bed without using bedrails? 2  Help needed moving to and from a bed to a chair (including a wheelchair)? 2  Help needed standing up from a chair using your arms (e.g., wheelchair or bedside chair)? 2  Help needed to walk in hospital room? 3  Help needed climbing 3-5 steps with a railing?  2  6 Click Score 13  Consider Recommendation of Discharge To: CIR/SNF/LTACH  Progressive Mobility  What is the highest level of mobility based on the progressive mobility assessment? Level 5 (Walks with assist in room/hall) - Balance while stepping forward/back and can walk in room with assist - Complete  Activity Ambulated with assistance in hallway  PT Goal Progression  Progress towards PT goals Progressing toward goals  PT Time Calculation  PT Start Time (ACUTE ONLY) 1200  PT Stop Time (ACUTE ONLY) 1230  PT Time Calculation (min) (ACUTE ONLY) 30 min  PT General Charges  $$ ACUTE PT VISIT 1 Visit  PT Treatments  $Gait Training 8-22 mins  $Therapeutic Exercise 8-22 mins   Zadie Cleverly, PTA

## 2022-12-06 NOTE — Progress Notes (Signed)
Warm Tap water enema administered to patient per MD orders. Patient tolerated procedure well and held contents of enema for 5 minutes before being assisted to bedside commode. Enema was unsuccessful in producing a bowel movement. Advised patient that MD would be notified; patient requesting suppository order.

## 2022-12-06 NOTE — Plan of Care (Signed)
  Problem: Education: Goal: Knowledge of General Education information will improve Description: Including pain rating scale, medication(s)/side effects and non-pharmacologic comfort measures Outcome: Progressing   Problem: Pain Managment: Goal: General experience of comfort will improve Outcome: Progressing   Problem: Safety: Goal: Ability to remain free from injury will improve Outcome: Progressing   

## 2022-12-06 NOTE — Progress Notes (Signed)
[facility] PROGRESS NOTE    Progress Note  Dawn Bradley  ZOX:[id number] DOB: April 04, 1941 DOA: 12/03/2022 PCP: Geoffry Paradise, MD     Brief Narrative:   Dawn Bradley is an 82 y.o. 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  Larey Seat face first and landed on her side and mid back denies any prodromal symptoms.  Imaging showed a T11 acute fracture discussed with neurosurgery who does not recommend any further intervention DLCO pain manage and physical therapy.  Assessment/Plan:   Cortical breakthrough T11 spine fracture (HCC) Acute T11 fracture. Needs to follow-up as an outpatient with neurosurgery as an outpatient. Recommended LCO brace. Pain control and physical therapy evaluation, she will need skilled nursing facility placement. She will need a 3-day stay for insurance approval, she has chosen [place].  Hyperkalemia: Follow strict I's and O's, KVO IV fluids.   Continue Lokelma orally. Potassium is 4.7 recheck basic metabolic panel.  Acute kidney injury/with a solitary kidney status post nephrectomy: Holding ACE inhibitor and diuretic therapy. He is improved, this morning is 1.2. Continue to hold ACE inhibitor and diuretic therapy.  Constipation: Has not had a bowel movement continue MiraLAX daily.  Normal anion gap metabolic acidosis: Probably contributing to her hyperkalemia as it has normalized with normal saline. Resolved with IV fluid resuscitation.  Leukocytosis: Likely reactive.  History of carcinoma: Follows at [facility] as surveillance cystoscopy in January 2024.  Obesity: Noted.  Hypothyroidism: Continue Synthroid.  Hypovolemic hyponatremia: Improved, KVO IV fluids.  Normocytic anemia: Follow-up with PCP as an  outpatient.   DVT prophylaxis: lovenox Family Communication: Husband Status is: Observation The patient remains OBS appropriate and will d/c before 2 midnights.    Code Status:     Code Status Orders  (From admission, onward)           Start     Ordered   12/03/22 2156  Full code  Continuous       Question:  By:  Answer:  Consent: discussion documented in EHR   12/03/22 2156           Code Status History     This patient has a current code status but no historical code status.         IV Access:   Peripheral IV   Procedures and diagnostic studies:   No results found.   Medical Consultants:   None.   Subjective:    Dawn Bradley pain is controlled has not had a bowel movement.  Objective:    Vitals:   12/05/22 0547 12/05/22 1400 12/05/22 2121 12/06/22 0547  BP: (!) 152/66 (!) 128/53 (!) 162/59 138/86  Pulse: 65 62 63 73  Resp: 16 17 18 18   Temp: 98.4 F (36.9 C) 98.3 F (36.8 C) 98.1 F (36.7 C) 98.9 F (37.2 C)  TempSrc:  Oral Oral Oral  SpO2: 97% 100% 99% 98%  Weight:      Height:       SpO2: 98 %   Intake/Output Summary (Last 24 hours) at 12/06/2022 0853 Last data filed at 12/06/2022 0546 Gross per 24 hour  Intake 1460.96 ml  Output 2250 ml  Net -789.04 ml    Filed Weights   12/03/22 1915  Weight: 90.7 kg    Exam: General exam: In no acute distress.  Respiratory system: Good air movement and clear to auscultation. Cardiovascular system: S1 & S2 heard, RRR. No JVD. Gastrointestinal system: Abdomen is nondistended, soft and nontender.  Extremities: No pedal edema. Skin: No rashes, lesions or ulcers Psychiatry: Judgement and insight appear normal. Mood & affect appropriate.  Data Reviewed:    Labs: Basic Metabolic Panel: Recent Labs  Lab 12/03/22 2117 12/04/22 0348 12/05/22 0341 12/06/22 0329  NA 125* 124* 130* 130*  K 5.6* 5.9* 5.5* 4.7  CL 97* 98 105 100  CO2 16* 19* 20* 22  GLUCOSE 136* 147* 131*  139*  BUN 40* 36* 24* 18  CREATININE 1.69* 1.48* 1.41* 1.28*  CALCIUM 9.1 8.7* 8.3* 8.8*    GFR Estimated Creatinine Clearance: 36.3 mL/min (A) (by C-G formula based on SCr of 1.28 mg/dL (H)). Liver Function Tests: No results for input(s): "AST", "ALT", "ALKPHOS", "BILITOT", "PROT", "ALBUMIN" in the last 168 hours. No results for input(s): "LIPASE", "AMYLASE" in the last 168 hours. No results for input(s): "AMMONIA" in the last 168 hours. Coagulation profile No results for input(s): "INR", "PROTIME" in the last 168 hours. COVID-19 Labs  No results for input(s): "DDIMER", "FERRITIN", "LDH", "CRP" in the last 72 hours.  No results found for: "SARSCOV2NAA"  CBC: Recent Labs  Lab 12/03/22 2117 12/04/22 0348  WBC 12.9* 10.8*  NEUTROABS 9.9*  --   HGB 11.3* 10.4*  HCT 33.2* 30.7*  MCV 93.3 94.8  PLT 247 199    Cardiac Enzymes: No results for input(s): "CKTOTAL", "CKMB", "CKMBINDEX", "TROPONINI" in the last 168 hours. BNP (last 3 results) No results for input(s): "PROBNP" in the last 8760 hours. CBG: No results for input(s): "GLUCAP" in the last 168 hours. D-Dimer: No results for input(s): "DDIMER" in the last 72 hours. Hgb A1c: No results for input(s): "HGBA1C" in the last 72 hours. Lipid Profile: No results for input(s): "CHOL", "HDL", "LDLCALC", "TRIG", "CHOLHDL", "LDLDIRECT" in the last 72 hours. Thyroid function studies: No results for input(s): "TSH", "T4TOTAL", "T3FREE", "THYROIDAB" in the last 72 hours.  Invalid input(s): "FREET3" Anemia work up: No results for input(s): "VITAMINB12", "FOLATE", "FERRITIN", "TIBC", "IRON", "RETICCTPCT" in the last 72 hours. Sepsis Labs: Recent Labs  Lab 12/03/22 2117 12/04/22 0348  WBC 12.9* 10.8*    Microbiology No results found for this or any previous visit (from the past 240 hour(s)).   Medications:    enoxaparin (LOVENOX) injection  40 mg Subcutaneous Q24H   levothyroxine  100 mcg Oral Q0600   lidocaine  1 patch  Transdermal Q24H   melatonin  3 mg Oral QHS   methocarbamol  500 mg Oral TID   senna-docusate  1 tablet Oral QHS   simvastatin  20 mg Oral QHS   tirzepatide  5 mg Subcutaneous Weekly   Continuous Infusions:      LOS: 2 days   Marinda Elk  [facility]  12/06/2022, 8:53 AM

## 2022-12-06 NOTE — Progress Notes (Signed)
Physical Therapy Treatment Patient Details Name: Dawn Bradley MRN: [id number] DOB: 01/13/1941 Today's Date: 12/06/2022   History of Present Illness 82 y.o. 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  Larey Seat face first and landed on her side and mid back denies any prodromal symptoms.  Imaging showed a T11 acute fracture    PT Comments    Pt without c/o back pain this pm, however does endorse Right Abdominal discomfort with sharp occasional pain. Pt states this is most likely gas and has not had a BM in several days. Nursing aware. PT session initiated with seated B LE strengthening exercises with good tolerance. Sit<>stand with MinA to attain upright standing at RW. Progressed gait training distance to ~293ft with RW and CGA. Decreased step length noted with vc's for upright posture. Pt returned to room and left sitting upright in chair with all needs in reach. Will continue to progress per POC.    Recommendations for follow up therapy are one component of a multi-disciplinary discharge planning process, led by the attending physician.  Recommendations may be updated based on patient status, additional functional criteria and insurance authorization.  Follow Up Recommendations  Can patient physically be transported by private vehicle: No    Assistance Recommended at Discharge Intermittent Supervision/Assistance  Patient can return home with the following A little help with walking and/or transfers;A little help with bathing/dressing/bathroom;Assist for transportation;Help with stairs or ramp for entrance   Equipment Recommendations  BSC/3in1;None recommended by PT (TBD at next facility)    Recommendations for Other Services       Precautions / Restrictions  Precautions Precautions: Fall;Back Precaution Comments: fell just prior to admission, denies other falls in past 6 months; instructed pt in log roll Required Braces or Orthoses: Spinal Brace Spinal Brace: Lumbar corset Restrictions Weight Bearing Restrictions: No     Mobility  Bed Mobility               General bed mobility comments:  (PT in chair pre/post session)    Transfers Overall transfer level: Needs assistance Equipment used: Rolling walker (2 wheels) Transfers: Sit to/from Stand Sit to Stand: Min assist           General transfer comment: VCs hand placement    Ambulation/Gait Ambulation/Gait assistance: Min guard Gait Distance (Feet):  (250) Assistive device: Rolling walker (2 wheels) Gait Pattern/deviations: Step-to pattern, Decreased stride length Gait velocity: decr     General Gait Details: steady, no loss of balance   Stairs             Wheelchair Mobility    Modified Rankin (Stroke Patients Only)       Balance Overall balance assessment: Needs assistance, History of Falls Sitting-balance support: Feet supported, No upper extremity supported Sitting balance-Leahy Scale: Good     Standing balance support: Bilateral upper extremity supported, During functional activity, No upper extremity supported Standing balance-Leahy Scale: Fair                              Cognition Arousal/Alertness: Awake/alert Behavior During Therapy: WFL for tasks assessed/performed Overall Cognitive Status: Within Functional Limits for tasks assessed  Exercises General Exercises - Lower Extremity Long Arc Quad: AROM, Both, 10 reps Hip ABduction/ADduction: AROM, Both, 10 reps, Seated Hip Flexion/Marching: AROM, Both, 10 reps, Seated Toe Raises: AROM, Both, 10 reps, Seated Heel Raises: AROM, Both, 10 reps, Seated    General Comments General comments (skin integrity, edema,  etc.): Pt educated on benefits of mobility and safety with spinal precautions      Pertinent Vitals/Pain Pain Assessment Pain Assessment: 0-10 Pain Score: 4  Pain Location: Right abdomen Pain Descriptors / Indicators: Sharp, Spasm Pain Intervention(s): Monitored during session    Home Living                          Prior Function            PT Goals (current goals can now be found in the care plan section) Acute Rehab PT Goals Patient Stated Goal: to get stronger Progress towards PT goals: Progressing toward goals    Frequency    Min 1X/week      PT Plan Current plan remains appropriate    Co-evaluation              AM-PAC PT "6 Clicks" Mobility   Outcome Measure  Help needed turning from your back to your side while in a flat bed without using bedrails?: A Lot Help needed moving from lying on your back to sitting on the side of a flat bed without using bedrails?: A Lot Help needed moving to and from a bed to a chair (including a wheelchair)?: A Little Help needed standing up from a chair using your arms (e.g., wheelchair or bedside chair)?: A Little Help needed to walk in hospital room?: A Little Help needed climbing 3-5 steps with a railing? : A Lot 6 Click Score: 15    End of Session Equipment Utilized During Treatment: Gait belt;Back brace Activity Tolerance: Patient tolerated treatment well Patient left: with chair alarm set;in chair;with call bell/phone within reach;with nursing/sitter in room Nurse Communication: Mobility status PT Visit Diagnosis: Difficulty in walking, not elsewhere classified (R26.2);Pain Pain - Right/Left: Right Pain - part of body:  (Abdomen)     Time: 4403-4742 PT Time Calculation (min) (ACUTE ONLY): 38 min  Charges:  $Gait Training: 8-22 mins $Therapeutic Exercise: 8-22 mins $Therapeutic Activity: 8-22 mins                    Zadie Cleverly, PTA  Jannet Askew 12/06/2022, 3:17 PM

## 2022-12-07 DIAGNOSIS — S22009A Unspecified fracture of unspecified thoracic vertebra, initial encounter for closed fracture: Secondary | ICD-10-CM | POA: Diagnosis not present

## 2022-12-07 DIAGNOSIS — E875 Hyperkalemia: Secondary | ICD-10-CM | POA: Diagnosis not present

## 2022-12-07 DIAGNOSIS — W19XXXA Unspecified fall, initial encounter: Secondary | ICD-10-CM | POA: Diagnosis not present

## 2022-12-07 DIAGNOSIS — S22088A Other fracture of T11-T12 vertebra, initial encounter for closed fracture: Secondary | ICD-10-CM | POA: Diagnosis not present

## 2022-12-07 LAB — BASIC METABOLIC PANEL
Anion gap: 9 (ref 5–15)
BUN: 15 mg/dL (ref 8–23)
CO2: 23 mmol/L (ref 22–32)
Calcium: 9 mg/dL (ref 8.9–10.3)
Chloride: 97 mmol/L — ABNORMAL LOW (ref 98–111)
Creatinine, Ser: 1.15 mg/dL — ABNORMAL HIGH (ref 0.44–1.00)
GFR, Estimated: 47 mL/min — ABNORMAL LOW (ref >60)
Glucose, Bld: 135 mg/dL — ABNORMAL HIGH (ref 70–99)
Potassium: 5.3 mmol/L — ABNORMAL HIGH (ref 3.5–5.1)
Sodium: 129 mmol/L — ABNORMAL LOW (ref 135–145)

## 2022-12-07 MED ORDER — SODIUM ZIRCONIUM CYCLOSILICATE 10 G PO PACK
10.0000 g | PACK | Freq: Two times a day (BID) | ORAL | Status: DC
  Administered 2022-12-07 (×2): 10 g via ORAL
  Filled 2022-12-07 (×3): qty 1

## 2022-12-07 NOTE — Progress Notes (Signed)
Patient had Soap suds  enema scheduled for 2000. Patient wanted to sleep and wait until a.m. to enema. On-call provider notified.

## 2022-12-07 NOTE — Plan of Care (Signed)

## 2022-12-07 NOTE — Plan of Care (Signed)
  Problem: Clinical Measurements: Goal: Diagnostic test results will improve Outcome: Progressing   Problem: Activity: Goal: Risk for activity intolerance will decrease Outcome: Progressing   Problem: Elimination: Goal: Will not experience complications related to bowel motility Outcome: Progressing   

## 2022-12-07 NOTE — Progress Notes (Signed)
[facility] PROGRESS NOTE    Progress Note  Dawn Bradley  QIH:[id number] DOB: 09/03/1940 DOA: 12/03/2022 PCP: Geoffry Paradise, MD     Brief Narrative:   Dawn Bradley is an 82 y.o. 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  Larey Seat face first and landed on her side and mid back denies any prodromal symptoms.  Imaging showed a T11 acute fracture discussed with neurosurgery who does not recommend any further intervention DLCO pain manage and physical therapy.  Assessment/Plan:   Cortical breakthrough T11 spine fracture (HCC) Acute T11 fracture. Needs to follow-up as an outpatient with neurosurgery as an outpatient. Recommended LCO brace. Pain control and physical therapy evaluation, she will need skilled nursing facility placement. She will need a 3-day stay for insurance approval, she has chosen [place]. Date of discharge 12/08/2022.  Hyperkalemia: Follow strict I's and O's, KVO IV fluids.   Potassium this morning is trending up 5.3 continue Lokelma. Recheck basic metabolic panel tomorrow morning.  Acute kidney injury/with a solitary kidney status post nephrectomy: Holding ACE inhibitor and diuretic therapy. He is improved, this morning is 1.2. Continue to hold ACE inhibitor and diuretic therapy.  Constipation: Has not had a bowel movement, she had a tapwater enema yesterday. She refused to smog yesterday, continue MiraLAX p.o. twice daily. To get a smog today  Normal anion gap metabolic acidosis: Probably contributing to her hyperkalemia as it has normalized with normal saline. Resolved with IV fluid resuscitation.  Leukocytosis: Likely reactive.  History of carcinoma: Follows at [facility] as surveillance cystoscopy in January  2024.  Obesity: Noted.  Hypothyroidism: Continue Synthroid.  Hypovolemic hyponatremia: Improved, KVO IV fluids.  Normocytic anemia: Follow-up with PCP as an outpatient.   DVT prophylaxis: lovenox Family Communication: Husband Status is: Observation The patient remains OBS appropriate and will d/c before 2 midnights.    Code Status:     Code Status Orders  (From admission, onward)           Start     Ordered   12/03/22 2156  Full code  Continuous       Question:  By:  Answer:  Consent: discussion documented in EHR   12/03/22 2156           Code Status History     This patient has a current code status but no historical code status.         IV Access:   Peripheral IV   Procedures and diagnostic studies:   No results found.   Medical Consultants:   None.   Subjective:    Dawn Bradley pain control, has not had bowel movement, she refuses smog yesterday  Objective:    Vitals:   12/06/22 0547 12/06/22 1409 12/06/22 2141 12/07/22 0641  BP: 138/86 137/78 (!) 155/60 (!) 153/71  Pulse: 73 65 61 (!) 54  Resp: 18 18 17 17   Temp: 98.9 F (37.2 C) 98.3 F (36.8 C) 98 F (36.7 C) (!) 97.5 F (36.4 C)  TempSrc: Oral Oral Oral Oral  SpO2: 98% 100% 99% 99%  Weight:      Height:       SpO2: 99 %   Intake/Output Summary (Last 24 hours) at 12/07/2022 0929 Last data filed at 12/07/2022 0543 Gross per 24 hour  Intake 760 ml  Output 1700 ml  Net -940 ml    Filed Weights   12/03/22 1915  Weight: 90.7 kg    Exam: General exam: In no acute distress. Respiratory system: Good air movement and clear to auscultation. Cardiovascular system: S1 & S2 heard, RRR. No JVD. Gastrointestinal system: Abdomen is nondistended, soft and nontender.  Extremities: No pedal edema. Skin: No rashes, lesions or ulcers Psychiatry: Judgement and insight appear normal. Mood & affect appropriate.  Data Reviewed:    Labs: Basic Metabolic  Panel: Recent Labs  Lab 12/03/22 2117 12/04/22 0348 12/05/22 0341 12/06/22 0329 12/07/22 0307  NA 125* 124* 130* 130* 129*  K 5.6* 5.9* 5.5* 4.7 5.3*  CL 97* 98 105 100 97*  CO2 16* 19* 20* 22 23  GLUCOSE 136* 147* 131* 139* 135*  BUN 40* 36* 24* 18 15  CREATININE 1.69* 1.48* 1.41* 1.28* 1.15*  CALCIUM 9.1 8.7* 8.3* 8.8* 9.0    GFR Estimated Creatinine Clearance: 40.4 mL/min (A) (by C-G formula based on SCr of 1.15 mg/dL (H)). Liver Function Tests: No results for input(s): "AST", "ALT", "ALKPHOS", "BILITOT", "PROT", "ALBUMIN" in the last 168 hours. No results for input(s): "LIPASE", "AMYLASE" in the last 168 hours. No results for input(s): "AMMONIA" in the last 168 hours. Coagulation profile No results for input(s): "INR", "PROTIME" in the last 168 hours. COVID-19 Labs  No results for input(s): "DDIMER", "FERRITIN", "LDH", "CRP" in the last 72 hours.  No results found for: "SARSCOV2NAA"  CBC: Recent Labs  Lab 12/03/22 2117 12/04/22 0348  WBC 12.9* 10.8*  NEUTROABS 9.9*  --   HGB 11.3* 10.4*  HCT 33.2* 30.7*  MCV 93.3 94.8  PLT 247 199    Cardiac Enzymes: No results for input(s): "CKTOTAL", "CKMB", "CKMBINDEX", "TROPONINI" in the last 168 hours. BNP (last 3 results) No results for input(s): "PROBNP" in the last 8760 hours. CBG: No results for input(s): "GLUCAP" in the last 168 hours. D-Dimer: No results for input(s): "DDIMER" in the last 72 hours. Hgb A1c: No results for input(s): "HGBA1C" in the last 72 hours. Lipid Profile: No results for input(s): "CHOL", "HDL", "LDLCALC", "TRIG", "CHOLHDL", "LDLDIRECT" in the last 72 hours. Thyroid function studies: No results for input(s): "TSH", "T4TOTAL", "T3FREE", "THYROIDAB" in the last 72 hours.  Invalid input(s): "FREET3" Anemia work up: No results for input(s): "VITAMINB12", "FOLATE", "FERRITIN", "TIBC", "IRON", "RETICCTPCT" in the last 72 hours. Sepsis Labs: Recent Labs  Lab 12/03/22 2117 12/04/22 0348   WBC 12.9* 10.8*    Microbiology No results found for this or any previous visit (from the past 240 hour(s)).   Medications:    enoxaparin (LOVENOX) injection  40 mg Subcutaneous Q24H   levothyroxine  100 mcg Oral Q0600   lidocaine  1 patch Transdermal Q24H   melatonin  3 mg Oral QHS   methocarbamol  500 mg Oral TID   polyethylene glycol  17 g Oral BID   senna-docusate  1 tablet Oral QHS   simvastatin  20 mg Oral QHS   sorbitol, milk of mag, mineral oil, glycerin (SMOG) enema  960 mL Rectal Once   tirzepatide  5 mg Subcutaneous Weekly   Continuous Infusions:      LOS: 3 days   Marinda Elk  [facility]  12/07/2022, 9:29 AM

## 2022-12-07 NOTE — Progress Notes (Signed)
Physical Therapy Treatment Patient Details Name: Dawn Bradley MRN: [id number] DOB: 02/01/1941 Today's Date: 12/07/2022   History of Present Illness 82 y.o. 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  Larey Seat face first and landed on her side and mid back denies any prodromal symptoms.  Imaging showed a T11 acute fracture    PT Comments    Pt able to have a BM this am and feeling much better with less c/o right abdominal discomfort. Pt continues to require assist for bed mobility while maintaining spinal precautions with reliance on railing. Sit<>stand with CGA and vc's for technique. Gait distance increasing with RW, however remains slow and guarded with CGA. Pt instructed in standing LE strengthening exercises with good tolerance despite 7/10 back pain. Pt will continue to benefit from skilled PT services to regain previous independent function.   Recommendations for follow up therapy are one component of a multi-disciplinary discharge planning process, led by the attending physician.  Recommendations may be updated based on patient status, additional functional criteria and insurance authorization.  Follow Up Recommendations  Can patient physically be transported by private vehicle: No    Assistance Recommended at Discharge Intermittent Supervision/Assistance  Patient can return home with the following A little help with walking and/or transfers;A little help with bathing/dressing/bathroom;Assist for transportation;Help with stairs or ramp for entrance   Equipment Recommendations  Other (comment) (TBD at next facility)    Recommendations for Other Services       Precautions / Restrictions Precautions Precautions: Fall;Back Precaution Comments: fell just prior to  admission, denies other falls in past 6 months; instructed pt in log roll Required Braces or Orthoses: Spinal Brace Spinal Brace: Lumbar corset Restrictions Weight Bearing Restrictions: No     Mobility  Bed Mobility Overal bed mobility: Needs Assistance Bed Mobility: Rolling, Sidelying to Sit Rolling: Min assist Sidelying to sit: Min assist       General bed mobility comments: VCs technique for log roll, assist to initiate roll, then assist to raise trunk, reliant on side rail    Transfers Overall transfer level: Needs assistance Equipment used: Rolling walker (2 wheels) Transfers: Sit to/from Stand Sit to Stand: Min guard           General transfer comment: VCs hand placement for BSC transfer    Ambulation/Gait Ambulation/Gait assistance: Min guard Gait Distance (Feet): 150 Feet Assistive device: Rolling walker (2 wheels) Gait Pattern/deviations: Step-through pattern, Decreased stride length Gait velocity: decr     General Gait Details: steady, no loss of balance, slow cadence and guarded   Stairs             Wheelchair Mobility    Modified Rankin (Stroke Patients Only)       Balance Overall balance assessment: Needs assistance, History of Falls Sitting-balance support: Feet supported, No upper extremity supported Sitting balance-Leahy Scale: Good     Standing balance support: Bilateral upper extremity supported, During functional activity, Reliant on assistive device for balance Standing balance-Leahy Scale: Fair Standing balance comment: Poor without assistive device                            Cognition Arousal/Alertness: Awake/alert Behavior During Therapy: WFL for tasks assessed/performed Overall Cognitive Status: Within Functional Limits for tasks assessed  General Comments: Pleasant and cooperative        Exercises General Exercises - Lower Extremity Hip ABduction/ADduction:  AROM, Both, 10 reps, Standing Hip Flexion/Marching: AROM, Both, 10 reps, Standing Heel Raises: AROM, Both, 10 reps, Standing Mini-Sqauts: AROM, Both, 10 reps, Standing    General Comments General comments (skin integrity, edema, etc.): Education provided for proper donning of LSO. Pt required assist for placement and adjustment of straps      Pertinent Vitals/Pain Pain Assessment Pain Assessment: 0-10 Pain Score: 7  Pain Location:  (back) Pain Descriptors / Indicators: Aching, Discomfort Pain Intervention(s): Monitored during session, Patient requesting pain meds-RN notified    Home Living                          Prior Function            PT Goals (current goals can now be found in the care plan section) Acute Rehab PT Goals Patient Stated Goal: to get stronger Progress towards PT goals: Progressing toward goals    Frequency    Min 1X/week      PT Plan Current plan remains appropriate    Co-evaluation              AM-PAC PT "6 Clicks" Mobility   Outcome Measure  Help needed turning from your back to your side while in a flat bed without using bedrails?: A Lot Help needed moving from lying on your back to sitting on the side of a flat bed without using bedrails?: A Lot Help needed moving to and from a bed to a chair (including a wheelchair)?: A Little Help needed standing up from a chair using your arms (e.g., wheelchair or bedside chair)?: A Little Help needed to walk in hospital room?: A Little Help needed climbing 3-5 steps with a railing? : A Lot 6 Click Score: 15    End of Session Equipment Utilized During Treatment: Gait belt;Back brace Activity Tolerance: Patient tolerated treatment well Patient left: in chair;with call bell/phone within reach Nurse Communication: Mobility status PT Visit Diagnosis: Difficulty in walking, not elsewhere classified (R26.2);Pain Pain - Right/Left: Right     Time: 1610-9604 PT Time Calculation (min)  (ACUTE ONLY): 44 min  Charges:  $Gait Training: 8-22 mins $Therapeutic Exercise: 8-22 mins $Therapeutic Activity: 8-22 mins                    Dawn Bradley, PTA  Jannet Askew 12/07/2022, 2:34 PM

## 2022-12-08 DIAGNOSIS — Z7401 Bed confinement status: Secondary | ICD-10-CM | POA: Diagnosis not present

## 2022-12-08 DIAGNOSIS — Z9181 History of falling: Secondary | ICD-10-CM | POA: Diagnosis not present

## 2022-12-08 DIAGNOSIS — M1990 Unspecified osteoarthritis, unspecified site: Secondary | ICD-10-CM | POA: Diagnosis not present

## 2022-12-08 DIAGNOSIS — E871 Hypo-osmolality and hyponatremia: Secondary | ICD-10-CM | POA: Diagnosis not present

## 2022-12-08 DIAGNOSIS — E039 Hypothyroidism, unspecified: Secondary | ICD-10-CM | POA: Diagnosis not present

## 2022-12-08 DIAGNOSIS — E559 Vitamin D deficiency, unspecified: Secondary | ICD-10-CM | POA: Diagnosis not present

## 2022-12-08 DIAGNOSIS — Z8559 Personal history of malignant neoplasm of other urinary tract organ: Secondary | ICD-10-CM | POA: Diagnosis not present

## 2022-12-08 DIAGNOSIS — M6281 Muscle weakness (generalized): Secondary | ICD-10-CM | POA: Diagnosis not present

## 2022-12-08 DIAGNOSIS — I700 Atherosclerosis of aorta: Secondary | ICD-10-CM | POA: Diagnosis not present

## 2022-12-08 DIAGNOSIS — R52 Pain, unspecified: Secondary | ICD-10-CM | POA: Diagnosis not present

## 2022-12-08 DIAGNOSIS — S22009D Unspecified fracture of unspecified thoracic vertebra, subsequent encounter for fracture with routine healing: Secondary | ICD-10-CM | POA: Diagnosis not present

## 2022-12-08 DIAGNOSIS — K219 Gastro-esophageal reflux disease without esophagitis: Secondary | ICD-10-CM | POA: Diagnosis not present

## 2022-12-08 DIAGNOSIS — E1159 Type 2 diabetes mellitus with other circulatory complications: Secondary | ICD-10-CM | POA: Diagnosis not present

## 2022-12-08 DIAGNOSIS — I152 Hypertension secondary to endocrine disorders: Secondary | ICD-10-CM | POA: Diagnosis not present

## 2022-12-08 DIAGNOSIS — Z87891 Personal history of nicotine dependence: Secondary | ICD-10-CM | POA: Diagnosis not present

## 2022-12-08 DIAGNOSIS — Z6834 Body mass index (BMI) 34.0-34.9, adult: Secondary | ICD-10-CM | POA: Diagnosis not present

## 2022-12-08 DIAGNOSIS — W19XXXA Unspecified fall, initial encounter: Secondary | ICD-10-CM | POA: Diagnosis not present

## 2022-12-08 DIAGNOSIS — Z7985 Long-term (current) use of injectable non-insulin antidiabetic drugs: Secondary | ICD-10-CM | POA: Diagnosis not present

## 2022-12-08 DIAGNOSIS — E119 Type 2 diabetes mellitus without complications: Secondary | ICD-10-CM | POA: Diagnosis not present

## 2022-12-08 DIAGNOSIS — Z7984 Long term (current) use of oral hypoglycemic drugs: Secondary | ICD-10-CM | POA: Diagnosis not present

## 2022-12-08 DIAGNOSIS — S22089D Unspecified fracture of T11-T12 vertebra, subsequent encounter for fracture with routine healing: Secondary | ICD-10-CM | POA: Diagnosis not present

## 2022-12-08 DIAGNOSIS — S72002D Fracture of unspecified part of neck of left femur, subsequent encounter for closed fracture with routine healing: Secondary | ICD-10-CM | POA: Diagnosis not present

## 2022-12-08 DIAGNOSIS — E669 Obesity, unspecified: Secondary | ICD-10-CM | POA: Diagnosis not present

## 2022-12-08 DIAGNOSIS — Z853 Personal history of malignant neoplasm of breast: Secondary | ICD-10-CM | POA: Diagnosis not present

## 2022-12-08 DIAGNOSIS — M48061 Spinal stenosis, lumbar region without neurogenic claudication: Secondary | ICD-10-CM | POA: Diagnosis not present

## 2022-12-08 DIAGNOSIS — S22009A Unspecified fracture of unspecified thoracic vertebra, initial encounter for closed fracture: Secondary | ICD-10-CM | POA: Diagnosis not present

## 2022-12-08 DIAGNOSIS — R531 Weakness: Secondary | ICD-10-CM | POA: Diagnosis not present

## 2022-12-08 DIAGNOSIS — G4700 Insomnia, unspecified: Secondary | ICD-10-CM | POA: Diagnosis not present

## 2022-12-08 DIAGNOSIS — D849 Immunodeficiency, unspecified: Secondary | ICD-10-CM | POA: Diagnosis not present

## 2022-12-08 DIAGNOSIS — S22088A Other fracture of T11-T12 vertebra, initial encounter for closed fracture: Secondary | ICD-10-CM | POA: Diagnosis not present

## 2022-12-08 DIAGNOSIS — R41841 Cognitive communication deficit: Secondary | ICD-10-CM | POA: Diagnosis not present

## 2022-12-08 DIAGNOSIS — K5901 Slow transit constipation: Secondary | ICD-10-CM | POA: Diagnosis not present

## 2022-12-08 DIAGNOSIS — E875 Hyperkalemia: Secondary | ICD-10-CM | POA: Diagnosis not present

## 2022-12-08 DIAGNOSIS — M4986 Spondylopathy in diseases classified elsewhere, lumbar region: Secondary | ICD-10-CM | POA: Diagnosis not present

## 2022-12-08 DIAGNOSIS — R2689 Other abnormalities of gait and mobility: Secondary | ICD-10-CM | POA: Diagnosis not present

## 2022-12-08 LAB — BASIC METABOLIC PANEL
Anion gap: 7 (ref 5–15)
BUN: 18 mg/dL (ref 8–23)
CO2: 24 mmol/L (ref 22–32)
Calcium: 8.4 mg/dL — ABNORMAL LOW (ref 8.9–10.3)
Chloride: 95 mmol/L — ABNORMAL LOW (ref 98–111)
Creatinine, Ser: 1.31 mg/dL — ABNORMAL HIGH (ref 0.44–1.00)
GFR, Estimated: 40 mL/min — ABNORMAL LOW (ref >60)
Glucose, Bld: 139 mg/dL — ABNORMAL HIGH (ref 70–99)
Potassium: 4.3 mmol/L (ref 3.5–5.1)
Sodium: 126 mmol/L — ABNORMAL LOW (ref 135–145)

## 2022-12-08 MED ORDER — SODIUM ZIRCONIUM CYCLOSILICATE 10 G PO PACK
10.0000 g | PACK | Freq: Once | ORAL | Status: AC
  Administered 2022-12-08: 10 g via ORAL
  Filled 2022-12-08: qty 1

## 2022-12-08 MED ORDER — HYDROCODONE-ACETAMINOPHEN 5-325 MG PO TABS: 1.0000 | ORAL_TABLET | Freq: Four times a day (QID) | ORAL | 0 refills | Status: AC | PRN

## 2022-12-08 MED ORDER — POLYETHYLENE GLYCOL 3350 17 G PO PACK: 17.0000 g | PACK | Freq: Every day | ORAL | 0 refills | Status: DC | PRN

## 2022-12-08 MED ORDER — SODIUM ZIRCONIUM CYCLOSILICATE 10 G PO PACK: 10.0000 g | PACK | Freq: Two times a day (BID) | ORAL | Status: DC

## 2022-12-08 MED ORDER — MELATONIN 3 MG PO TABS: 3.0000 mg | ORAL_TABLET | Freq: Every day | ORAL | 0 refills | Status: AC

## 2022-12-08 MED ORDER — LISINOPRIL-HYDROCHLOROTHIAZIDE 20-12.5 MG PO TABS: 1.0000 | ORAL_TABLET | Freq: Two times a day (BID) | ORAL | Status: DC

## 2022-12-08 MED ORDER — POLYETHYLENE GLYCOL 3350 17 G PO PACK: 17.0000 g | PACK | Freq: Every day | ORAL | 0 refills | Status: AC

## 2022-12-08 NOTE — Discharge Summary (Addendum)
Physician Discharge Summary  Dawn Bradley ZOX:[id number] DOB: May 02, 1941 DOA: 12/03/2022  PCP: Geoffry Paradise, MD  Admit date: 12/03/2022 Discharge date: 12/08/2022  Admitted From: Home Disposition:  SNF  Recommendations for Outpatient Follow-up:  Follow up with PCP in 1-2 weeks Please obtain BMP/CBC this week, if her potassium is stable we can restart her antihypertensive medication   Home Health:No Equipment/Devices:None  Discharge Condition:Stable CODE STATUS:Full Diet recommendation: Heart Healthy   Brief/Interim Summary: 82 y.o. 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  Larey Seat face first and landed on her side and mid back denies any prodromal symptoms.  Imaging showed a T11 acute fracture discussed with neurosurgery who does not recommend any further intervention DLCO pain manage and physical therapy.   Discharge Diagnoses:  Principal Problem:   Thoracic spine fracture (HCC) Active Problems:   Hyponatremia   Hyperkalemia   AKI (acute kidney injury) (HCC)   Hypothyroidism   Obesity (BMI 30-39.9)   History of carcinoma   Leukocytosis   Closed left hip fracture (HCC)  Cortical breakthrough T11 spine fracture: Neurosurgery was consulted recommended conservative management and follow-up with them as an outpatient. They recommended LCO brace. She was continued on narcotics for pain control and given MiraLAX without success of bowel movement so she was given a smog. Physical therapy evaluated the patient recommended rehab.  Hyperkalemia: She was started on IV fluids and Lokelma her potassium improved. She will have her basic metabolic panel checked this week and if stable can restart antihypertensive medication specifically ACE inhibitor.  Acute  kidney injury/with solitary kidney status post nephrectomy: Holding ACE inhibitor due to acute kidney injury and hyperkalemia. She was started on IV fluids her creatinine returned to baseline.  Severe constipation: She was placed on a bowel meant MiraLAX twice a day and Senokot.  And it was unsuccessful. So she was given a smog and she had multiple bowel movements. She will continue MiraLAX daily as an outpatient.  Normal anion gap metabolic acidosis: Resolved with IV fluid resuscitation.  Leukocytosis: Likely reactive she remained afebrile.  History of carcinoma. Follow-up with [facility] and outpatient.  Obesity: Noted.  Hypothyroidism: Continue Synthroid.   Discharge Instructions  Discharge Instructions     Diet - low sodium heart healthy   Complete by: As directed    Increase activity slowly   Complete by: As directed       Allergies as of 12/08/2022       Reactions   Morphine Nausea And Vomiting, Other (See Comments), Shortness Of Breath   Reaction: Heart races irregularly.  Patient states she is not allergic to Morphine 03-06-16.   Pholcodine    Other Reaction(s): respiratory distress   Shellfish Allergy Hives, Swelling   Shellfish-derived Products Hives, Other (See Comments), Swelling   Reaction: Red face   Codeine Other (See Comments)   REACTION: heart races REACTION: heart races  REACTION: Heart races irregularly, vomiting and shortness of breath   Latex Itching   Other Other (See Comments)   Faux gold causes itching and skin to turn green   Tramadol Nausea And Vomiting        Medication List     TAKE these medications    acetaminophen 325 MG tablet Commonly known as: TYLENOL Take 650 mg by mouth every 6 (six) hours as needed.  amoxicillin 500 MG capsule Commonly known as: AMOXIL TAKE 4 CAPSULES 1 HOUR PRIOR TO DENTAL PROCEDURE   HYDROcodone-acetaminophen 5-325 MG tablet Commonly known as: NORCO/VICODIN Take 1 tablet by mouth every 6  (six) hours as needed for up to 3 days for moderate pain.   letrozole 2.5 MG tablet Commonly known as: FEMARA Take 1 tablet (2.5 mg total) by mouth daily.   levothyroxine 100 MCG tablet Commonly known as: SYNTHROID Take 100 mcg by mouth daily before breakfast.   lisinopril-hydrochlorothiazide 20-12.5 MG tablet Commonly known as: ZESTORETIC Take 1 tablet by mouth 2 (two) times daily. Start taking on: Dec 15, 2022 What changed: These instructions start on Dec 15, 2022. If you are unsure what to do until then, ask your doctor or other care provider.   melatonin 3 MG Tabs tablet Take 1 tablet (3 mg total) by mouth at bedtime.   metformin 500 MG (OSM) 24 hr tablet Commonly known as: FORTAMET Take 500 mg by mouth 2 (two) times daily with a meal.   methocarbamol 500 MG tablet Commonly known as: ROBAXIN Take 500 mg by mouth 3 (three) times daily.   Mounjaro 5 MG/0.5ML Pen Generic drug: tirzepatide Inject 5 mg into the skin once a week. What changed: Another medication with the same name was removed. Continue taking this medication, and follow the directions you see here.   polyethylene glycol 17 g packet Commonly known as: MIRALAX / GLYCOLAX Take 17 g by mouth daily.   simvastatin 20 MG tablet Commonly known as: ZOCOR Take 20 mg by mouth at bedtime.   valACYclovir 1000 MG tablet Commonly known as: VALTREX Take 1,000 mg by mouth 2 (two) times daily as needed.   Vitamin D (Ergocalciferol) 1.25 MG (50000 UNIT) Caps capsule Commonly known as: DRISDOL Take 50,000 Units by mouth. Pt takes on Saturdays.   zolpidem 10 MG tablet Commonly known as: AMBIEN Take 10 mg by mouth at bedtime as needed for sleep.        Contact information for after-discharge care     Destination     [facility] Preferred SNF .   Service: Skilled Nursing Contact information: [address] [place] [address] [phone]                     Allergies  Allergen Reactions   Morphine Nausea And Vomiting, Other (See Comments) and Shortness Of Breath    Reaction: Heart races irregularly.  Patient states she is not allergic to Morphine 03-06-16.   Pholcodine     Other Reaction(s): respiratory distress   Shellfish Allergy Hives and Swelling   Shellfish-Derived Products Hives, Other (See Comments) and Swelling    Reaction: Red face   Codeine Other (See Comments)    REACTION: heart races  REACTION: heart races  REACTION: Heart races irregularly, vomiting and shortness of breath   Latex Itching   Other Other (See Comments)    Faux gold causes itching and skin to turn green   Tramadol Nausea And Vomiting    Consultations: Neurosurgery   Procedures/Studies: CT Thoracic Spine Wo Contrast  Result Date: 12/03/2022 CLINICAL DATA:  Back trauma, no prior imaging (Age >= 16y) mid-thoracic spinal tenderness after fall; Back trauma, no prior imaging (Age >= 16y) EXAM: CT Thoracic and Lumbar spine none contrast TECHNIQUE: Multiplanar CT images of the thoracic and lumbar spine were reconstructed from contemporary CT of the Chest, Abdomen, and Pelvis. RADIATION DOSE REDUCTION: This exam was performed according  to the departmental dose-optimization program which includes automated exposure control, adjustment of the mA and/or kV according to patient size and/or use of iterative reconstruction technique. CONTRAST:  None or No additional COMPARISON:  MRI lumbar spine 03/12/2022, CT abdomen pelvis 07/26/2014 FINDINGS: CT THORACIC SPINE FINDINGS Alignment: Normal. Vertebrae: Multilevel degenerative changes spine with continues osteophyte formation with a cortical break through the T11 vertebral body osteophyte and fractures of the vertebral body. Paraspinal and other soft tissues: Negative. Disc levels: Grossly maintained CT LUMBAR SPINE FINDINGS Segmentation: 5 lumbar type vertebrae. Alignment: Stable grade 1 anterolisthesis of L5 on S1. Vertebrae:  No acute fracture or focal pathologic process. Paraspinal and other soft tissues: Negative. Disc levels: Intervertebral disc space vacuum phenomenon at the L4-L5 and L5-S1 levels. Other: Atherosclerotic plaque of the aorta.  Colonic diverticulosis. IMPRESSION: 1. Acute T11 fracture through the continuous osteophytes and vertebral body. Recommend MRI for further evaluation of the ligaments. 2. No acute displaced fracture or traumatic listhesis of the lumbar spine. 3.  Aortic Atherosclerosis (ICD10-I70.0). Electronically Signed   By: Tish Frederickson M.D.   On: 12/03/2022 20:41   CT Lumbar Spine Wo Contrast  Result Date: 12/03/2022 CLINICAL DATA:  Back trauma, no prior imaging (Age >= 16y) mid-thoracic spinal tenderness after fall; Back trauma, no prior imaging (Age >= 16y) EXAM: CT Thoracic and Lumbar spine none contrast TECHNIQUE: Multiplanar CT images of the thoracic and lumbar spine were reconstructed from contemporary CT of the Chest, Abdomen, and Pelvis. RADIATION DOSE REDUCTION: This exam was performed according to the departmental dose-optimization program which includes automated exposure control, adjustment of the mA and/or kV according to patient size and/or use of iterative reconstruction technique. CONTRAST:  None or No additional COMPARISON:  MRI lumbar spine 03/12/2022, CT abdomen pelvis 07/26/2014 FINDINGS: CT THORACIC SPINE FINDINGS Alignment: Normal. Vertebrae: Multilevel degenerative changes spine with continues osteophyte formation with a cortical break through the T11 vertebral body osteophyte and fractures of the vertebral body. Paraspinal and other soft tissues: Negative. Disc levels: Grossly maintained CT LUMBAR SPINE FINDINGS Segmentation: 5 lumbar type vertebrae. Alignment: Stable grade 1 anterolisthesis of L5 on S1. Vertebrae: No acute fracture or focal pathologic process. Paraspinal and other soft tissues: Negative. Disc levels: Intervertebral disc space vacuum phenomenon at the L4-L5  and L5-S1 levels. Other: Atherosclerotic plaque of the aorta.  Colonic diverticulosis. IMPRESSION: 1. Acute T11 fracture through the continuous osteophytes and vertebral body. Recommend MRI for further evaluation of the ligaments. 2. No acute displaced fracture or traumatic listhesis of the lumbar spine. 3.  Aortic Atherosclerosis (ICD10-I70.0). Electronically Signed   By: Tish Frederickson M.D.   On: 12/03/2022 20:41   DG HIP UNILAT WITH PELVIS 2-3 VIEWS RIGHT  Result Date: 12/03/2022 CLINICAL DATA:  Trauma, fall EXAM: DG HIP (WITH OR WITHOUT PELVIS) 2-3V RIGHT COMPARISON:  None Available. FINDINGS: No recent fracture or dislocation is seen. Small smooth marginated calcification is seen in the soft tissues lateral to the right iliac bone, possibly residual from previous ligament injury. Small bony spurs are seen in right hip. Degenerative changes are noted in lower lumbar spine. IMPRESSION: No recent fracture or dislocation is seen. Small bony spurs are seen in right hip. Lumbar spondylosis. Electronically Signed   By: Ernie Avena M.D.   On: 12/03/2022 20:41   CT Head Wo Contrast  Result Date: 12/03/2022 CLINICAL DATA:  Head trauma, moderate-severe; Polytrauma, blunt EXAM: CT HEAD WITHOUT CONTRAST CT CERVICAL SPINE WITHOUT CONTRAST TECHNIQUE: Multidetector CT imaging of the head and cervical spine  was performed following the standard protocol without intravenous contrast. Multiplanar CT image reconstructions of the cervical spine were also generated. RADIATION DOSE REDUCTION: This exam was performed according to the departmental dose-optimization program which includes automated exposure control, adjustment of the mA and/or kV according to patient size and/or use of iterative reconstruction technique. COMPARISON:  None Available. FINDINGS: CT HEAD FINDINGS Brain: Cerebral ventricle sizes are concordant with the degree of cerebral volume loss. Patchy and confluent areas of decreased attenuation are  noted throughout the deep and periventricular white matter of the cerebral hemispheres bilaterally, compatible with chronic microvascular ischemic disease. No evidence of large-territorial acute infarction. No parenchymal hemorrhage. No mass lesion. No extra-axial collection. No mass effect or midline shift. No hydrocephalus. Basilar cisterns are patent. Vascular: No hyperdense vessel. Atherosclerotic calcifications are present within the cavernous internal carotid and vertebral arteries. Skull: No acute fracture or focal lesion. Sinuses/Orbits: Paranasal sinuses and mastoid air cells are clear. The orbits are unremarkable. Other: None. CT CERVICAL SPINE FINDINGS Alignment: Grade 1 anterolisthesis of C4 on C5. Skull base and vertebrae: Multilevel moderate degenerative change of the spine. No acute fracture. No aggressive appearing focal osseous lesion or focal pathologic process. Soft tissues and spinal canal: No prevertebral fluid or swelling. No visible canal hematoma. Upper chest: Unremarkable. Other: None. IMPRESSION: 1. No acute intracranial abnormality. 2. No acute displaced fracture or traumatic listhesis of the cervical spine. Electronically Signed   By: Tish Frederickson M.D.   On: 12/03/2022 20:36   CT Cervical Spine Wo Contrast  Result Date: 12/03/2022 CLINICAL DATA:  Head trauma, moderate-severe; Polytrauma, blunt EXAM: CT HEAD WITHOUT CONTRAST CT CERVICAL SPINE WITHOUT CONTRAST TECHNIQUE: Multidetector CT imaging of the head and cervical spine was performed following the standard protocol without intravenous contrast. Multiplanar CT image reconstructions of the cervical spine were also generated. RADIATION DOSE REDUCTION: This exam was performed according to the departmental dose-optimization program which includes automated exposure control, adjustment of the mA and/or kV according to patient size and/or use of iterative reconstruction technique. COMPARISON:  None Available. FINDINGS: CT HEAD  FINDINGS Brain: Cerebral ventricle sizes are concordant with the degree of cerebral volume loss. Patchy and confluent areas of decreased attenuation are noted throughout the deep and periventricular white matter of the cerebral hemispheres bilaterally, compatible with chronic microvascular ischemic disease. No evidence of large-territorial acute infarction. No parenchymal hemorrhage. No mass lesion. No extra-axial collection. No mass effect or midline shift. No hydrocephalus. Basilar cisterns are patent. Vascular: No hyperdense vessel. Atherosclerotic calcifications are present within the cavernous internal carotid and vertebral arteries. Skull: No acute fracture or focal lesion. Sinuses/Orbits: Paranasal sinuses and mastoid air cells are clear. The orbits are unremarkable. Other: None. CT CERVICAL SPINE FINDINGS Alignment: Grade 1 anterolisthesis of C4 on C5. Skull base and vertebrae: Multilevel moderate degenerative change of the spine. No acute fracture. No aggressive appearing focal osseous lesion or focal pathologic process. Soft tissues and spinal canal: No prevertebral fluid or swelling. No visible canal hematoma. Upper chest: Unremarkable. Other: None. IMPRESSION: 1. No acute intracranial abnormality. 2. No acute displaced fracture or traumatic listhesis of the cervical spine. Electronically Signed   By: Tish Frederickson M.D.   On: 12/03/2022 20:36   (Echo, Carotid, EGD, Colonoscopy, ERCP)    Subjective: No complaints pain is controlled.  Discharge Exam: Vitals:   12/07/22 2237 12/08/22 0554  BP: 137/61 (!) 117/59  Pulse: 62 65  Resp: 17 17  Temp: 98.9 F (37.2 C) 98.2 F (36.8 C)  SpO2:  98% 96%   Vitals:   12/07/22 0641 12/07/22 1438 12/07/22 2237 12/08/22 0554  BP: (!) 153/71 123/66 137/61 (!) 117/59  Pulse: (!) 54 66 62 65  Resp: 17 18 17 17   Temp: (!) 97.5 F (36.4 C) 97.8 F (36.6 C) 98.9 F (37.2 C) 98.2 F (36.8 C)  TempSrc: Oral Oral Oral Oral  SpO2: 99% 99% 98% 96%   Weight:      Height:        General: Pt is alert, awake, not in acute distress Cardiovascular: RRR, S1/S2 +, no rubs, no gallops Respiratory: CTA bilaterally, no wheezing, no rhonchi Abdominal: Soft, NT, ND, bowel sounds + Extremities: no edema, no cyanosis    The results of significant diagnostics from this hospitalization (including imaging, microbiology, ancillary and laboratory) are listed below for reference.     Microbiology: No results found for this or any previous visit (from the past 240 hour(s)).   Labs: BNP (last 3 results) No results for input(s): "BNP" in the last 8760 hours. Basic Metabolic Panel: Recent Labs  Lab 12/04/22 0348 12/05/22 0341 12/06/22 0329 12/07/22 0307 12/08/22 0329  NA 124* 130* 130* 129* 126*  K 5.9* 5.5* 4.7 5.3* 4.3  CL 98 105 100 97* 95*  CO2 19* 20* 22 23 24   GLUCOSE 147* 131* 139* 135* 139*  BUN 36* 24* 18 15 18   CREATININE 1.48* 1.41* 1.28* 1.15* 1.31*  CALCIUM 8.7* 8.3* 8.8* 9.0 8.4*   Liver Function Tests: No results for input(s): "AST", "ALT", "ALKPHOS", "BILITOT", "PROT", "ALBUMIN" in the last 168 hours. No results for input(s): "LIPASE", "AMYLASE" in the last 168 hours. No results for input(s): "AMMONIA" in the last 168 hours. CBC: Recent Labs  Lab 12/03/22 2117 12/04/22 0348  WBC 12.9* 10.8*  NEUTROABS 9.9*  --   HGB 11.3* 10.4*  HCT 33.2* 30.7*  MCV 93.3 94.8  PLT 247 199   Cardiac Enzymes: No results for input(s): "CKTOTAL", "CKMB", "CKMBINDEX", "TROPONINI" in the last 168 hours. BNP: Invalid input(s): "POCBNP" CBG: No results for input(s): "GLUCAP" in the last 168 hours. D-Dimer No results for input(s): "DDIMER" in the last 72 hours. Hgb A1c No results for input(s): "HGBA1C" in the last 72 hours. Lipid Profile No results for input(s): "CHOL", "HDL", "LDLCALC", "TRIG", "CHOLHDL", "LDLDIRECT" in the last 72 hours. Thyroid function studies No results for input(s): "TSH", "T4TOTAL", "T3FREE", "THYROIDAB"  in the last 72 hours.  Invalid input(s): "FREET3" Anemia work up No results for input(s): "VITAMINB12", "FOLATE", "FERRITIN", "TIBC", "IRON", "RETICCTPCT" in the last 72 hours. Urinalysis    Component Value Date/Time   COLORURINE YELLOW 12/31/2010 0935   APPEARANCEUR CLOUDY (A) 12/31/2010 0935   LABSPEC 1.019 12/31/2010 0935   PHURINE 5.0 12/31/2010 0935   GLUCOSEU NEGATIVE 12/31/2010 0935   HGBUR NEGATIVE 12/31/2010 0935   BILIRUBINUR NEGATIVE 12/31/2010 0935   KETONESUR NEGATIVE 12/31/2010 0935   PROTEINUR NEGATIVE 12/31/2010 0935   UROBILINOGEN 0.2 12/31/2010 0935   NITRITE NEGATIVE 12/31/2010 0935   LEUKOCYTESUR SMALL (A) 12/31/2010 0935   Sepsis Labs Recent Labs  Lab 12/03/22 2117 12/04/22 0348  WBC 12.9* 10.8*   Microbiology No results found for this or any previous visit (from the past 240 hour(s)).   SIGNED:   Marinda Elk, MD  [facility] 12/08/2022, 12:22 PM Pager   If 7PM-7AM, please contact night-coverage [email or web address] Password [email or web address]

## 2022-12-08 NOTE — Progress Notes (Signed)
Physical Therapy Treatment Patient Details Name: Dawn Bradley MRN: [id number] DOB: 09-18-1940 Today's Date: 12/08/2022   History of Present Illness 82 y.o. 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  Larey Seat face first and landed on her side and mid back denies any prodromal symptoms.  Imaging showed a T11 acute fracture    PT Comments    Pt is progressing well with mobility, she demonstrates good understanding of log roll technique for bed mobility. She tolerated increased ambulation distance of 150' + 39' with RW and a standing rest break.    Recommendations for follow up therapy are one component of a multi-disciplinary discharge planning process, led by the attending physician.  Recommendations may be updated based on patient status, additional functional criteria and insurance authorization.  Follow Up Recommendations  Can patient physically be transported by private vehicle: No    Assistance Recommended at Discharge Intermittent Supervision/Assistance  Patient can return home with the following A little help with walking and/or transfers;A little help with bathing/dressing/bathroom;Assist for transportation;Help with stairs or ramp for entrance   Equipment Recommendations  Other (comment) (TBD at next facility)    Recommendations for Other Services       Precautions / Restrictions Precautions Precautions: Fall;Back Precaution Comments: fell just prior to admission, denies other falls in past 6 months; reviewed log roll Required Braces or Orthoses: Spinal Brace Spinal Brace: Lumbar corset Restrictions Weight Bearing Restrictions: No     Mobility  Bed Mobility Overal bed mobility: Needs Assistance Bed Mobility: Rolling, Sidelying to Sit Rolling:  Supervision Sidelying to sit: Supervision       General bed mobility comments: VCs technique for log roll, used bed rail    Transfers Overall transfer level: Needs assistance Equipment used: Rolling walker (2 wheels) Transfers: Sit to/from Stand Sit to Stand: Min guard           General transfer comment: VCs hand placement for BSC transfer    Ambulation/Gait Ambulation/Gait assistance: Min guard Gait Distance (Feet): 150 Feet Assistive device: Rolling walker (2 wheels) Gait Pattern/deviations: Step-through pattern, Decreased stride length Gait velocity: decr     General Gait Details: steady, no loss of balance, slow cadence and guarded, 150' + 36' with standing rest break   Stairs             Wheelchair Mobility    Modified Rankin (Stroke Patients Only)       Balance Overall balance assessment: Needs assistance, History of Falls Sitting-balance support: Feet supported, No upper extremity supported Sitting balance-Leahy Scale: Good     Standing balance support: Bilateral upper extremity supported, During functional activity, Reliant on assistive device for balance Standing balance-Leahy Scale: Fair                              Cognition Arousal/Alertness: Awake/alert Behavior During Therapy: WFL for tasks assessed/performed Overall Cognitive Status: Within Functional Limits for tasks assessed                                 General Comments: Pleasant and cooperative        Exercises      General Comments        Pertinent Vitals/Pain  Pain Assessment Pain Score: 7  Pain Location: back Pain Descriptors / Indicators: Aching, Discomfort Pain Intervention(s): Limited activity within patient's tolerance, Monitored during session, Premedicated before session    Home Living                          Prior Function            PT Goals (current goals can now be found in the care plan section) Acute Rehab  PT Goals Patient Stated Goal: to get stronger PT Goal Formulation: With patient Time For Goal Achievement: 12/18/22 Potential to Achieve Goals: Good Progress towards PT goals: Progressing toward goals    Frequency    Min 1X/week      PT Plan Current plan remains appropriate    Co-evaluation              AM-PAC PT "6 Clicks" Mobility   Outcome Measure  Help needed turning from your back to your side while in a flat bed without using bedrails?: A Little Help needed moving from lying on your back to sitting on the side of a flat bed without using bedrails?: A Little Help needed moving to and from a bed to a chair (including a wheelchair)?: A Little Help needed standing up from a chair using your arms (e.g., wheelchair or bedside chair)?: A Little Help needed to walk in hospital room?: A Little Help needed climbing 3-5 steps with a railing? : A Lot 6 Click Score: 17    End of Session Equipment Utilized During Treatment: Gait belt;Back brace Activity Tolerance: Patient tolerated treatment well Patient left: in chair;with call bell/phone within reach Nurse Communication: Mobility status PT Visit Diagnosis: Difficulty in walking, not elsewhere classified (R26.2);Pain Pain - Right/Left: Right     Time: 1610-9604 PT Time Calculation (min) (ACUTE ONLY): 18 min  Charges:  $Gait Training: 8-22 mins                    Ralene Bathe Kistler PT 12/08/2022  Acute Rehabilitation Services  Office [phone]

## 2022-12-08 NOTE — TOC Transition Note (Signed)
Transition of Care [facility]) - CM/SW Discharge Note  Patient Details  Name: Dawn Bradley MRN: [id number] Date of Birth: 08/30/1940  Transition of Care [facility]) CM/SW Contact:  Ewing Schlein, LCSW Phone Number: 12/08/2022, 1:05 PM  Clinical Narrative: Patient is medically stable to discharge to [facility]. Patient will go to room 101P. Discharge summary, discharge orders, and SNF transfer report faxed to facility in hub. Medical necessity form done; PTAR scheduled. Patient and spouse updated regarding discharge. Discharge packet completed. RN updated. TOC signing off.   Final next level of care: Skilled Nursing Facility Barriers to Discharge: Barriers Resolved  Patient Goals and CMS Choice CMS Medicare.gov Compare Post Acute Care list provided to:: Patient Choice offered to / list presented to : Patient, Spouse  Discharge Placement Existing PASRR number confirmed : 12/04/22          Patient chooses bed at: [facility] Patient to be transferred to facility by: PTAR Name of family member notified: Katelynn Mcalpine (spouse) Patient and family notified of of transfer: 12/08/22  Discharge Plan and Services Additional resources added to the After Visit Summary for   In-house Referral: Clinical Social Work Post Acute Care Choice: Skilled Nursing Facility          DME Arranged: N/A DME Agency: NA  Social Determinants of Health (SDOH) Interventions SDOH Screenings   Food Insecurity: No Food Insecurity (12/03/2022)  Housing: Low Risk  (12/03/2022)  Transportation Needs: No Transportation Needs (12/04/2022)  Utilities: Not At Risk (12/04/2022)  Tobacco Use: Medium Risk (12/03/2022)   Readmission Risk Interventions     No data to display

## 2022-12-08 NOTE — Plan of Care (Signed)
Problem: Clinical Measurements: Goal: Ability to maintain clinical measurements within normal limits will improve Outcome: Progressing   Problem: Pain Managment: Goal: General experience of comfort will improve Outcome: Progressing   Problem: Safety: Goal: Ability to remain free from injury will improve Outcome: Progressing   Haydee Salter, RN 12/08/22 7:44 AM

## 2022-12-08 NOTE — Plan of Care (Signed)
Patient discharging to [facility]. Report called to Lance Sell, Nurse at facility. Awaiting transfer by PTAR. Haydee Salter, RN 12/08/22 1:28 PM

## 2022-12-09 ENCOUNTER — Other Ambulatory Visit (HOSPITAL_BASED_OUTPATIENT_CLINIC_OR_DEPARTMENT_OTHER): Payer: Self-pay

## 2022-12-10 DIAGNOSIS — S22009D Unspecified fracture of unspecified thoracic vertebra, subsequent encounter for fracture with routine healing: Secondary | ICD-10-CM | POA: Diagnosis not present

## 2022-12-10 DIAGNOSIS — R531 Weakness: Secondary | ICD-10-CM | POA: Diagnosis not present

## 2022-12-10 DIAGNOSIS — Z9181 History of falling: Secondary | ICD-10-CM | POA: Diagnosis not present

## 2022-12-10 DIAGNOSIS — R52 Pain, unspecified: Secondary | ICD-10-CM | POA: Diagnosis not present

## 2022-12-12 DIAGNOSIS — E559 Vitamin D deficiency, unspecified: Secondary | ICD-10-CM | POA: Diagnosis not present

## 2022-12-12 DIAGNOSIS — I700 Atherosclerosis of aorta: Secondary | ICD-10-CM | POA: Diagnosis not present

## 2022-12-12 DIAGNOSIS — G4700 Insomnia, unspecified: Secondary | ICD-10-CM | POA: Diagnosis not present

## 2022-12-12 DIAGNOSIS — E669 Obesity, unspecified: Secondary | ICD-10-CM | POA: Diagnosis not present

## 2022-12-12 DIAGNOSIS — Z853 Personal history of malignant neoplasm of breast: Secondary | ICD-10-CM | POA: Diagnosis not present

## 2022-12-12 DIAGNOSIS — E1159 Type 2 diabetes mellitus with other circulatory complications: Secondary | ICD-10-CM | POA: Diagnosis not present

## 2022-12-12 DIAGNOSIS — E871 Hypo-osmolality and hyponatremia: Secondary | ICD-10-CM | POA: Diagnosis not present

## 2022-12-12 DIAGNOSIS — Z7985 Long-term (current) use of injectable non-insulin antidiabetic drugs: Secondary | ICD-10-CM | POA: Diagnosis not present

## 2022-12-12 DIAGNOSIS — Z87891 Personal history of nicotine dependence: Secondary | ICD-10-CM | POA: Diagnosis not present

## 2022-12-12 DIAGNOSIS — Z7984 Long term (current) use of oral hypoglycemic drugs: Secondary | ICD-10-CM | POA: Diagnosis not present

## 2022-12-12 DIAGNOSIS — I152 Hypertension secondary to endocrine disorders: Secondary | ICD-10-CM | POA: Diagnosis not present

## 2022-12-12 DIAGNOSIS — S22089D Unspecified fracture of T11-T12 vertebra, subsequent encounter for fracture with routine healing: Secondary | ICD-10-CM | POA: Diagnosis not present

## 2022-12-12 DIAGNOSIS — Z8559 Personal history of malignant neoplasm of other urinary tract organ: Secondary | ICD-10-CM | POA: Diagnosis not present

## 2022-12-12 DIAGNOSIS — K5901 Slow transit constipation: Secondary | ICD-10-CM | POA: Diagnosis not present

## 2022-12-12 DIAGNOSIS — E039 Hypothyroidism, unspecified: Secondary | ICD-10-CM | POA: Diagnosis not present

## 2022-12-12 DIAGNOSIS — S72002D Fracture of unspecified part of neck of left femur, subsequent encounter for closed fracture with routine healing: Secondary | ICD-10-CM | POA: Diagnosis not present

## 2022-12-12 DIAGNOSIS — M48061 Spinal stenosis, lumbar region without neurogenic claudication: Secondary | ICD-10-CM | POA: Diagnosis not present

## 2022-12-12 DIAGNOSIS — Z6834 Body mass index (BMI) 34.0-34.9, adult: Secondary | ICD-10-CM | POA: Diagnosis not present

## 2022-12-12 DIAGNOSIS — K219 Gastro-esophageal reflux disease without esophagitis: Secondary | ICD-10-CM | POA: Diagnosis not present

## 2022-12-12 DIAGNOSIS — M1990 Unspecified osteoarthritis, unspecified site: Secondary | ICD-10-CM | POA: Diagnosis not present

## 2022-12-12 DIAGNOSIS — E875 Hyperkalemia: Secondary | ICD-10-CM | POA: Diagnosis not present

## 2022-12-12 DIAGNOSIS — Z9181 History of falling: Secondary | ICD-10-CM | POA: Diagnosis not present

## 2022-12-19 DIAGNOSIS — S72002D Fracture of unspecified part of neck of left femur, subsequent encounter for closed fracture with routine healing: Secondary | ICD-10-CM | POA: Diagnosis not present

## 2022-12-19 DIAGNOSIS — M48061 Spinal stenosis, lumbar region without neurogenic claudication: Secondary | ICD-10-CM | POA: Diagnosis not present

## 2022-12-19 DIAGNOSIS — S22089D Unspecified fracture of T11-T12 vertebra, subsequent encounter for fracture with routine healing: Secondary | ICD-10-CM | POA: Diagnosis not present

## 2022-12-19 DIAGNOSIS — E1159 Type 2 diabetes mellitus with other circulatory complications: Secondary | ICD-10-CM | POA: Diagnosis not present

## 2022-12-19 DIAGNOSIS — I700 Atherosclerosis of aorta: Secondary | ICD-10-CM | POA: Diagnosis not present

## 2022-12-19 DIAGNOSIS — I152 Hypertension secondary to endocrine disorders: Secondary | ICD-10-CM | POA: Diagnosis not present

## 2022-12-22 DIAGNOSIS — I152 Hypertension secondary to endocrine disorders: Secondary | ICD-10-CM | POA: Diagnosis not present

## 2022-12-22 DIAGNOSIS — M48061 Spinal stenosis, lumbar region without neurogenic claudication: Secondary | ICD-10-CM | POA: Diagnosis not present

## 2022-12-22 DIAGNOSIS — S22089D Unspecified fracture of T11-T12 vertebra, subsequent encounter for fracture with routine healing: Secondary | ICD-10-CM | POA: Diagnosis not present

## 2022-12-22 DIAGNOSIS — K5900 Constipation, unspecified: Secondary | ICD-10-CM | POA: Diagnosis not present

## 2022-12-22 DIAGNOSIS — S72002D Fracture of unspecified part of neck of left femur, subsequent encounter for closed fracture with routine healing: Secondary | ICD-10-CM | POA: Diagnosis not present

## 2022-12-22 DIAGNOSIS — I10 Essential (primary) hypertension: Secondary | ICD-10-CM | POA: Diagnosis not present

## 2022-12-22 DIAGNOSIS — E1169 Type 2 diabetes mellitus with other specified complication: Secondary | ICD-10-CM | POA: Diagnosis not present

## 2022-12-22 DIAGNOSIS — E1159 Type 2 diabetes mellitus with other circulatory complications: Secondary | ICD-10-CM | POA: Diagnosis not present

## 2022-12-22 DIAGNOSIS — C649 Malignant neoplasm of unspecified kidney, except renal pelvis: Secondary | ICD-10-CM | POA: Diagnosis not present

## 2022-12-22 DIAGNOSIS — I700 Atherosclerosis of aorta: Secondary | ICD-10-CM | POA: Diagnosis not present

## 2022-12-22 DIAGNOSIS — E669 Obesity, unspecified: Secondary | ICD-10-CM | POA: Diagnosis not present

## 2022-12-24 DIAGNOSIS — S72002D Fracture of unspecified part of neck of left femur, subsequent encounter for closed fracture with routine healing: Secondary | ICD-10-CM | POA: Diagnosis not present

## 2022-12-24 DIAGNOSIS — I700 Atherosclerosis of aorta: Secondary | ICD-10-CM | POA: Diagnosis not present

## 2022-12-24 DIAGNOSIS — E1159 Type 2 diabetes mellitus with other circulatory complications: Secondary | ICD-10-CM | POA: Diagnosis not present

## 2022-12-24 DIAGNOSIS — M48061 Spinal stenosis, lumbar region without neurogenic claudication: Secondary | ICD-10-CM | POA: Diagnosis not present

## 2022-12-24 DIAGNOSIS — S22089D Unspecified fracture of T11-T12 vertebra, subsequent encounter for fracture with routine healing: Secondary | ICD-10-CM | POA: Diagnosis not present

## 2022-12-24 DIAGNOSIS — I152 Hypertension secondary to endocrine disorders: Secondary | ICD-10-CM | POA: Diagnosis not present

## 2022-12-28 ENCOUNTER — Other Ambulatory Visit: Payer: Self-pay

## 2022-12-28 ENCOUNTER — Inpatient Hospital Stay (HOSPITAL_COMMUNITY)
Admission: EM | Admit: 2022-12-28 | Discharge: 2023-01-02 | DRG: 643 | Disposition: A | Discharge status: Home Health | Payer: Medicare Other | Attending: Internal Medicine | Admitting: Internal Medicine

## 2022-12-28 ENCOUNTER — Encounter (HOSPITAL_COMMUNITY): Payer: Self-pay | Admitting: Radiology

## 2022-12-28 ENCOUNTER — Emergency Department (HOSPITAL_COMMUNITY): Payer: Medicare Other

## 2022-12-28 DIAGNOSIS — K7460 Unspecified cirrhosis of liver: Secondary | ICD-10-CM | POA: Diagnosis not present

## 2022-12-28 DIAGNOSIS — Z9049 Acquired absence of other specified parts of digestive tract: Secondary | ICD-10-CM | POA: Diagnosis not present

## 2022-12-28 DIAGNOSIS — D0512 Intraductal carcinoma in situ of left breast: Secondary | ICD-10-CM | POA: Diagnosis not present

## 2022-12-28 DIAGNOSIS — Z885 Allergy status to narcotic agent status: Secondary | ICD-10-CM

## 2022-12-28 DIAGNOSIS — G4700 Insomnia, unspecified: Secondary | ICD-10-CM | POA: Diagnosis not present

## 2022-12-28 DIAGNOSIS — E669 Obesity, unspecified: Secondary | ICD-10-CM | POA: Diagnosis present

## 2022-12-28 DIAGNOSIS — E785 Hyperlipidemia, unspecified: Secondary | ICD-10-CM | POA: Diagnosis present

## 2022-12-28 DIAGNOSIS — E860 Dehydration: Secondary | ICD-10-CM | POA: Diagnosis present

## 2022-12-28 DIAGNOSIS — S199XXA Unspecified injury of neck, initial encounter: Secondary | ICD-10-CM | POA: Diagnosis not present

## 2022-12-28 DIAGNOSIS — Z79890 Hormone replacement therapy (postmenopausal): Secondary | ICD-10-CM

## 2022-12-28 DIAGNOSIS — Z96653 Presence of artificial knee joint, bilateral: Secondary | ICD-10-CM | POA: Diagnosis present

## 2022-12-28 DIAGNOSIS — Z91013 Allergy to seafood: Secondary | ICD-10-CM

## 2022-12-28 DIAGNOSIS — W19XXXA Unspecified fall, initial encounter: Secondary | ICD-10-CM | POA: Diagnosis present

## 2022-12-28 DIAGNOSIS — E878 Other disorders of electrolyte and fluid balance, not elsewhere classified: Secondary | ICD-10-CM | POA: Diagnosis present

## 2022-12-28 DIAGNOSIS — E861 Hypovolemia: Secondary | ICD-10-CM | POA: Diagnosis present

## 2022-12-28 DIAGNOSIS — M48061 Spinal stenosis, lumbar region without neurogenic claudication: Secondary | ICD-10-CM | POA: Diagnosis present

## 2022-12-28 DIAGNOSIS — R296 Repeated falls: Secondary | ICD-10-CM | POA: Diagnosis present

## 2022-12-28 DIAGNOSIS — Z7984 Long term (current) use of oral hypoglycemic drugs: Secondary | ICD-10-CM

## 2022-12-28 DIAGNOSIS — Z888 Allergy status to other drugs, medicaments and biological substances status: Secondary | ICD-10-CM

## 2022-12-28 DIAGNOSIS — E222 Syndrome of inappropriate secretion of antidiuretic hormone: Secondary | ICD-10-CM | POA: Diagnosis not present

## 2022-12-28 DIAGNOSIS — N1832 Chronic kidney disease, stage 3b: Secondary | ICD-10-CM | POA: Diagnosis present

## 2022-12-28 DIAGNOSIS — T464X5A Adverse effect of angiotensin-converting-enzyme inhibitors, initial encounter: Secondary | ICD-10-CM | POA: Diagnosis present

## 2022-12-28 DIAGNOSIS — Z7985 Long-term (current) use of injectable non-insulin antidiabetic drugs: Secondary | ICD-10-CM

## 2022-12-28 DIAGNOSIS — Z91040 Latex allergy status: Secondary | ICD-10-CM

## 2022-12-28 DIAGNOSIS — E874 Mixed disorder of acid-base balance: Secondary | ICD-10-CM | POA: Diagnosis not present

## 2022-12-28 DIAGNOSIS — Z79811 Long term (current) use of aromatase inhibitors: Secondary | ICD-10-CM

## 2022-12-28 DIAGNOSIS — I700 Atherosclerosis of aorta: Secondary | ICD-10-CM | POA: Diagnosis present

## 2022-12-28 DIAGNOSIS — K760 Fatty (change of) liver, not elsewhere classified: Secondary | ICD-10-CM | POA: Diagnosis not present

## 2022-12-28 DIAGNOSIS — K219 Gastro-esophageal reflux disease without esophagitis: Secondary | ICD-10-CM | POA: Diagnosis not present

## 2022-12-28 DIAGNOSIS — Z87891 Personal history of nicotine dependence: Secondary | ICD-10-CM

## 2022-12-28 DIAGNOSIS — G9341 Metabolic encephalopathy: Secondary | ICD-10-CM | POA: Diagnosis present

## 2022-12-28 DIAGNOSIS — E039 Hypothyroidism, unspecified: Secondary | ICD-10-CM | POA: Diagnosis not present

## 2022-12-28 DIAGNOSIS — K5900 Constipation, unspecified: Secondary | ICD-10-CM | POA: Diagnosis present

## 2022-12-28 DIAGNOSIS — Z8554 Personal history of malignant neoplasm of ureter: Secondary | ICD-10-CM

## 2022-12-28 DIAGNOSIS — E1122 Type 2 diabetes mellitus with diabetic chronic kidney disease: Secondary | ICD-10-CM | POA: Diagnosis present

## 2022-12-28 DIAGNOSIS — S22089A Unspecified fracture of T11-T12 vertebra, initial encounter for closed fracture: Secondary | ICD-10-CM | POA: Diagnosis present

## 2022-12-28 DIAGNOSIS — E871 Hypo-osmolality and hyponatremia: Secondary | ICD-10-CM | POA: Diagnosis not present

## 2022-12-28 DIAGNOSIS — Z79899 Other long term (current) drug therapy: Secondary | ICD-10-CM

## 2022-12-28 DIAGNOSIS — E119 Type 2 diabetes mellitus without complications: Secondary | ICD-10-CM

## 2022-12-28 DIAGNOSIS — K529 Noninfective gastroenteritis and colitis, unspecified: Secondary | ICD-10-CM | POA: Diagnosis present

## 2022-12-28 DIAGNOSIS — N183 Chronic kidney disease, stage 3 unspecified: Secondary | ICD-10-CM | POA: Diagnosis present

## 2022-12-28 DIAGNOSIS — I10 Essential (primary) hypertension: Secondary | ICD-10-CM | POA: Diagnosis present

## 2022-12-28 DIAGNOSIS — R531 Weakness: Secondary | ICD-10-CM

## 2022-12-28 DIAGNOSIS — Z6834 Body mass index (BMI) 34.0-34.9, adult: Secondary | ICD-10-CM

## 2022-12-28 DIAGNOSIS — Z905 Acquired absence of kidney: Secondary | ICD-10-CM

## 2022-12-28 DIAGNOSIS — Z803 Family history of malignant neoplasm of breast: Secondary | ICD-10-CM

## 2022-12-28 DIAGNOSIS — R918 Other nonspecific abnormal finding of lung field: Secondary | ICD-10-CM | POA: Diagnosis not present

## 2022-12-28 DIAGNOSIS — R4182 Altered mental status, unspecified: Secondary | ICD-10-CM | POA: Diagnosis not present

## 2022-12-28 DIAGNOSIS — N179 Acute kidney failure, unspecified: Secondary | ICD-10-CM | POA: Diagnosis present

## 2022-12-28 DIAGNOSIS — Z853 Personal history of malignant neoplasm of breast: Secondary | ICD-10-CM

## 2022-12-28 DIAGNOSIS — Z923 Personal history of irradiation: Secondary | ICD-10-CM

## 2022-12-28 DIAGNOSIS — I129 Hypertensive chronic kidney disease with stage 1 through stage 4 chronic kidney disease, or unspecified chronic kidney disease: Secondary | ICD-10-CM | POA: Diagnosis present

## 2022-12-28 LAB — CBC WITH DIFFERENTIAL/PLATELET
Abs Immature Granulocytes: 0.05 10*3/uL (ref 0.00–0.07)
Basophils Absolute: 0 10*3/uL (ref 0.0–0.1)
Basophils Relative: 0 %
Eosinophils Absolute: 0 10*3/uL (ref 0.0–0.5)
Eosinophils Relative: 0 %
HCT: 32.3 % — ABNORMAL LOW (ref 36.0–46.0)
Hemoglobin: 11.7 g/dL — ABNORMAL LOW (ref 12.0–15.0)
Immature Granulocytes: 1 %
Lymphocytes Relative: 14 %
Lymphs Abs: 1.2 10*3/uL (ref 0.7–4.0)
MCH: 31 pg (ref 26.0–34.0)
MCHC: 36.2 g/dL — ABNORMAL HIGH (ref 30.0–36.0)
MCV: 85.4 fL (ref 80.0–100.0)
Monocytes Absolute: 0.8 10*3/uL (ref 0.1–1.0)
Monocytes Relative: 9 %
Neutro Abs: 6.4 10*3/uL (ref 1.7–7.7)
Neutrophils Relative %: 76 %
Platelets: 278 10*3/uL (ref 150–400)
RBC: 3.78 MIL/uL — ABNORMAL LOW (ref 3.87–5.11)
RDW: 12.4 % (ref 11.5–15.5)
WBC: 8.5 10*3/uL (ref 4.0–10.5)
nRBC: 0 % (ref 0.0–0.2)

## 2022-12-28 LAB — COMPREHENSIVE METABOLIC PANEL
ALT: 20 U/L (ref 0–44)
AST: 23 U/L (ref 15–41)
Albumin: 4.1 g/dL (ref 3.5–5.0)
Alkaline Phosphatase: 59 U/L (ref 38–126)
Anion gap: 14 (ref 5–15)
BUN: 25 mg/dL — ABNORMAL HIGH (ref 8–23)
CO2: 16 mmol/L — ABNORMAL LOW (ref 22–32)
Calcium: 9.1 mg/dL (ref 8.9–10.3)
Chloride: 80 mmol/L — ABNORMAL LOW (ref 98–111)
Creatinine, Ser: 1.36 mg/dL — ABNORMAL HIGH (ref 0.44–1.00)
GFR, Estimated: 39 mL/min — ABNORMAL LOW (ref >60)
Glucose, Bld: 110 mg/dL — ABNORMAL HIGH (ref 70–99)
Potassium: 4.8 mmol/L (ref 3.5–5.1)
Sodium: 110 mmol/L — CL (ref 135–145)
Total Bilirubin: 1.2 mg/dL (ref 0.3–1.2)
Total Protein: 6.7 g/dL (ref 6.5–8.1)

## 2022-12-28 LAB — CREATININE, URINE, RANDOM: Creatinine, Urine: 64 mg/dL

## 2022-12-28 LAB — MAGNESIUM: Magnesium: 1.8 mg/dL (ref 1.7–2.4)

## 2022-12-28 LAB — BLOOD GAS, VENOUS
Acid-base deficit: 3.2 mmol/L — ABNORMAL HIGH (ref 0.0–2.0)
Bicarbonate: 19.2 mmol/L — ABNORMAL LOW (ref 20.0–28.0)
O2 Saturation: 35.9 %
Patient temperature: 36.1
pCO2, Ven: 26 mmHg — ABNORMAL LOW (ref 44–60)
pH, Ven: 7.47 — ABNORMAL HIGH (ref 7.25–7.43)
pO2, Ven: <31 mmHg — CL (ref 32–45)

## 2022-12-28 LAB — RAPID URINE DRUG SCREEN, HOSP PERFORMED
Amphetamines: NOT DETECTED
Barbiturates: NOT DETECTED
Benzodiazepines: NOT DETECTED
Cocaine: NOT DETECTED
Opiates: NOT DETECTED
Tetrahydrocannabinol: NOT DETECTED

## 2022-12-28 LAB — URINALYSIS, ROUTINE W REFLEX MICROSCOPIC
Bilirubin Urine: NEGATIVE
Glucose, UA: NEGATIVE mg/dL
Hgb urine dipstick: NEGATIVE
Ketones, ur: 5 mg/dL — AB
Nitrite: NEGATIVE
Protein, ur: NEGATIVE mg/dL
Specific Gravity, Urine: 1.009 (ref 1.005–1.030)
pH: 5 (ref 5.0–8.0)

## 2022-12-28 LAB — SODIUM, URINE, RANDOM: Sodium, Ur: 29 mmol/L

## 2022-12-28 LAB — CBG MONITORING, ED: Glucose-Capillary: 119 mg/dL — ABNORMAL HIGH (ref 70–99)

## 2022-12-28 LAB — ETHANOL: Alcohol, Ethyl (B): <10 mg/dL (ref <10)

## 2022-12-28 MED ORDER — ALBUTEROL SULFATE (2.5 MG/3ML) 0.083% IN NEBU: 2.5000 mg | INHALATION_SOLUTION | RESPIRATORY_TRACT | Status: DC | PRN

## 2022-12-28 MED ORDER — SODIUM CHLORIDE 0.9 % IV SOLN: INTRAVENOUS | Status: DC

## 2022-12-28 MED ORDER — HEPARIN SODIUM (PORCINE) 5000 UNIT/ML IJ SOLN
5000.0000 [IU] | Freq: Three times a day (TID) | INTRAMUSCULAR | Status: DC
  Administered 2022-12-29 – 2023-01-02 (×13): 5000 [IU] via SUBCUTANEOUS
  Filled 2022-12-28 (×12): qty 1

## 2022-12-28 MED ORDER — ONDANSETRON HCL 4 MG PO TABS: 4.0000 mg | ORAL_TABLET | Freq: Four times a day (QID) | ORAL | Status: DC | PRN

## 2022-12-28 MED ORDER — SODIUM CHLORIDE 0.9 % IV BOLUS
500.0000 mL | Freq: Once | INTRAVENOUS | Status: AC
  Administered 2022-12-28: 500 mL via INTRAVENOUS

## 2022-12-28 MED ORDER — INSULIN ASPART 100 UNIT/ML IJ SOLN
0.0000 [IU] | Freq: Three times a day (TID) | INTRAMUSCULAR | Status: DC
  Administered 2022-12-30 – 2022-12-31 (×2): 1 [IU] via SUBCUTANEOUS
  Filled 2022-12-28: qty 0.06

## 2022-12-28 MED ORDER — ONDANSETRON HCL 4 MG/2ML IJ SOLN
4.0000 mg | Freq: Four times a day (QID) | INTRAMUSCULAR | Status: DC | PRN
  Administered 2022-12-29 (×2): 4 mg via INTRAVENOUS
  Filled 2022-12-28 (×2): qty 2

## 2022-12-28 MED ORDER — ACETAMINOPHEN 500 MG PO TABS
1000.0000 mg | ORAL_TABLET | Freq: Once | ORAL | Status: AC
  Administered 2022-12-28: 1000 mg via ORAL
  Filled 2022-12-28: qty 2

## 2022-12-28 MED ORDER — SIMVASTATIN 20 MG PO TABS
20.0000 mg | ORAL_TABLET | Freq: Every day | ORAL | Status: DC
  Administered 2022-12-29 – 2023-01-01 (×5): 20 mg via ORAL
  Filled 2022-12-28: qty 2
  Filled 2022-12-28: qty 1
  Filled 2022-12-28 (×2): qty 2
  Filled 2022-12-28: qty 1

## 2022-12-28 MED ORDER — LEVOTHYROXINE SODIUM 100 MCG PO TABS
100.0000 ug | ORAL_TABLET | Freq: Every day | ORAL | Status: DC
  Administered 2022-12-29: 100 ug via ORAL
  Filled 2022-12-28: qty 1

## 2022-12-28 MED ORDER — ACETAMINOPHEN 325 MG PO TABS
650.0000 mg | ORAL_TABLET | Freq: Four times a day (QID) | ORAL | Status: DC | PRN
  Administered 2022-12-30 – 2022-12-31 (×2): 650 mg via ORAL
  Filled 2022-12-28 (×2): qty 2

## 2022-12-28 NOTE — H&P (Addendum)
History and Physical    Dawn Bradley DOB: [birth year] DOA: 12/28/2022  PCP: Geoffry Paradise, MD  Patient coming from: home  I have personally briefly reviewed patient's old medical records in [facility]  Chief Complaint: confusion and weakness x several weeks now with fall with head strike  HPI: Dawn Bradley is a 82 y.o. 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 Patient now returns to ED with recurrent fall.  Per husband patient had fall while in shower and hit her head. He also notes that patient note been her self in several weeks has had increase falls, poor appetite, and confusion.  ED Course:  Afeb, Bp 133/65, hr 66, rr 16 sat 100%   Labs: NA 110(126), bicarb 16, glu 110 , cr 1.36 close to baseline EKG: snr 62 Wbc: 8.5, hgb 11.7 , plt278 Ph: 7.47/26 ETOH < 10 Tylenol 1gram NS 500 ml UANeg UDS Neg Urine NA 29 CTH IMPRESSION: 1. No acute fracture or traumatic malalignment of the cervical spine. 2. Stable degree of multilevel degenerative disc disease and facet hypertrophy.       Review of Systems: As per HPI otherwise 10 point review of systems negative.   Past Medical History:  Diagnosis Date   Abnormal radiologic findings on diagnostic imaging of right kidney    Angina at rest    Atherosclerosis of aorta (HCC)    Breast cancer (HCC)    Cancer (HCC)    lt breast   Chest discomfort    Diabetes mellitus    pre-diabetic   DOE (dyspnea on exertion)    GERD (gastroesophageal reflux disease)    Hyperlipidemia    Hypertension    Hypothyroid    Insomnia    Obesity    Osteoarthritis    Personal history of malignant  neoplasm of breast    Personal history of radiation therapy    Spinal stenosis of lumbar region, unspecified whether neurogenic claudication present    Swelling of lower extremity    Vitamin D deficiency     Past Surgical History:  Procedure Laterality Date   ANTERIOR CRUCIATE LIGAMENT REPAIR  1960   APPENDECTOMY  1962   BREAST LUMPECTOMY  06/11/2011   Procedure: LUMPECTOMY;  Surgeon: Robyne Askew, MD;  Location: [facility];  Service: General;  Laterality: Left;  Left Needle localization lumpectomy   CHOLECYSTECTOMY  1971   JOINT REPLACEMENT     both knees 2/12   REPLACEMENT TOTAL KNEE BILATERAL  09/06/10   TUBAL LIGATION  1980     reports that she has quit smoking. She does not have any smokeless tobacco history on file. She reports current alcohol use. She reports that she does not use drugs.  Allergies  Allergen Reactions   Morphine Nausea And Vomiting, Other (See Comments) and Shortness Of Breath    Reaction: Heart races irregularly.  Patient states she is not allergic to Morphine 03-06-16.   Pholcodine     Other Reaction(s): respiratory distress   Shellfish Allergy Hives and Swelling   Shellfish-Derived Products Hives, Other (See Comments) and Swelling    Reaction: Red face   Codeine Other (See Comments)    REACTION: heart races  REACTION:  heart races  REACTION: Heart races irregularly, vomiting and shortness of breath   Latex Itching   Other Other (See Comments)    Faux gold causes itching and skin to turn green   Tramadol Nausea And Vomiting    Family History  Problem Relation Age of Onset   Cancer Father        kidney   Breast cancer Sister    Cancer Sister        breast   *** Prior to Admission medications   Medication Sig Start Date End Date Taking? Authorizing Provider  acetaminophen (TYLENOL) 325 MG tablet Take 650 mg by mouth every 6 (six) hours as needed.    [provider]  amoxicillin (AMOXIL) 500 MG capsule TAKE 4  CAPSULES 1 HOUR PRIOR TO DENTAL PROCEDURE    [provider]  letrozole (FEMARA) 2.5 MG tablet Take 1 tablet (2.5 mg total) by mouth daily. Patient not taking: Reported on 12/03/2022 11/06/15   Boelter, Gwendolyn Fill, NP  levothyroxine (SYNTHROID) 100 MCG tablet Take 100 mcg by mouth daily before breakfast.    [provider]  lisinopril-hydrochlorothiazide (ZESTORETIC) 20-12.5 MG tablet Take 1 tablet by mouth 2 (two) times daily. 12/15/22   Marinda Elk, MD  melatonin 3 MG TABS tablet Take 1 tablet (3 mg total) by mouth at bedtime. 12/08/22   Marinda Elk, MD  metformin (FORTAMET) 500 MG (OSM) 24 hr tablet Take 500 mg by mouth 2 (two) times daily with a meal.    [provider]  methocarbamol (ROBAXIN) 500 MG tablet Take 500 mg by mouth 3 (three) times daily. 11/28/22   [provider]  polyethylene glycol (MIRALAX / GLYCOLAX) 17 g packet Take 17 g by mouth daily. 12/08/22   Marinda Elk, MD  simvastatin (ZOCOR) 20 MG tablet Take 20 mg by mouth at bedtime.    [provider]  tirzepatide Greggory Keen) 5 MG/0.5ML Pen Inject 5 mg into the skin once a week. 10/02/22     valACYclovir (VALTREX) 1000 MG tablet Take 1,000 mg by mouth 2 (two) times daily as needed.    [provider]  Vitamin D, Ergocalciferol, (DRISDOL) 50000 UNITS CAPS Take 50,000 Units by mouth. Pt takes on Saturdays.    [provider]  zolpidem (AMBIEN) 10 MG tablet Take 10 mg by mouth at bedtime as needed for sleep.    [provider]    Physical Exam: Vitals:   12/28/22 1800 12/28/22 2030 12/28/22 2145 12/28/22 2156  BP: 132/81 (!) 121/51 (!) 130/93   Pulse: 69 (!) 59 (!) 57   Resp:  13 13   Temp:    98 F (36.7 C)  TempSrc:    Oral  SpO2: 100% 98% 100%   Weight:      Height:        Constitutional: NAD, calm, comfortable Vitals:   12/28/22 1800 12/28/22 2030 12/28/22 2145 12/28/22 2156  BP: 132/81 (!) 121/51 (!) 130/93   Pulse: 69 (!)  59 (!) 57   Resp:  13 13   Temp:    98 F (36.7 C)  TempSrc:    Oral  SpO2: 100% 98% 100%   Weight:      Height:       Eyes: PERRL, lids and conjunctivae normal ENMT: Mucous membranes are moist. Posterior pharynx clear of any exudate or lesions.Normal dentition.  Neck: normal, supple, no masses, no thyromegaly Respiratory: clear to auscultation bilaterally, no wheezing, no crackles. Normal respiratory  effort. No accessory muscle use.  Cardiovascular: Regular rate and rhythm, no murmurs / rubs / gallops. No extremity edema. 2+ pedal pulses. No carotid bruits.  Abdomen: no tenderness, no masses palpated. No hepatosplenomegaly. Bowel sounds positive.  Musculoskeletal: no clubbing / cyanosis. No joint deformity upper and lower extremities. Good ROM, no contractures. Normal muscle tone.  Skin: no rashes, lesions, ulcers. No induration Neurologic: CN 2-12 grossly intact. Sensation intact, DTR normal. Strength 5/5 in all 4.  Psychiatric: Normal judgment and insight. Alert and oriented x 3. Normal mood.    Labs on Admission: I have personally reviewed following labs and imaging studies  CBC: Recent Labs  Lab 12/28/22 2107  WBC 8.5  NEUTROABS 6.4  HGB 11.7*  HCT 32.3*  MCV 85.4  PLT 278   Basic Metabolic Panel: Recent Labs  Lab 12/28/22 1845  NA 110*  K 4.8  CL 80*  CO2 16*  GLUCOSE 110*  BUN 25*  CREATININE 1.36*  CALCIUM 9.1  MG 1.8   GFR: Estimated Creatinine Clearance: 32 mL/min (A) (by C-G formula based on SCr of 1.36 mg/dL (H)). Liver Function Tests: Recent Labs  Lab 12/28/22 1845  AST 23  ALT 20  ALKPHOS 59  BILITOT 1.2  PROT 6.7  ALBUMIN 4.1   No results for input(s): "LIPASE", "AMYLASE" in the last 168 hours. No results for input(s): "AMMONIA" in the last 168 hours. Coagulation Profile: No results for input(s): "INR", "PROTIME" in the last 168 hours. Cardiac Enzymes: No results for input(s): "CKTOTAL", "CKMB", "CKMBINDEX", "TROPONINI" in the last 168  hours. BNP (last 3 results) No results for input(s): "PROBNP" in the last 8760 hours. HbA1C: No results for input(s): "HGBA1C" in the last 72 hours. CBG: Recent Labs  Lab 12/28/22 1916  GLUCAP 119*   Lipid Profile: No results for input(s): "CHOL", "HDL", "LDLCALC", "TRIG", "CHOLHDL", "LDLDIRECT" in the last 72 hours. Thyroid Function Tests: No results for input(s): "TSH", "T4TOTAL", "FREET4", "T3FREE", "THYROIDAB" in the last 72 hours. Anemia Panel: No results for input(s): "VITAMINB12", "FOLATE", "FERRITIN", "TIBC", "IRON", "RETICCTPCT" in the last 72 hours. Urine analysis:    Component Value Date/Time   COLORURINE YELLOW 12/28/2022 2144   APPEARANCEUR CLEAR 12/28/2022 2144   LABSPEC 1.009 12/28/2022 2144   PHURINE 5.0 12/28/2022 2144   GLUCOSEU NEGATIVE 12/28/2022 2144   HGBUR NEGATIVE 12/28/2022 2144   BILIRUBINUR NEGATIVE 12/28/2022 2144   KETONESUR 5 (A) 12/28/2022 2144   PROTEINUR NEGATIVE 12/28/2022 2144   UROBILINOGEN 0.2 12/31/2010 0935   NITRITE NEGATIVE 12/28/2022 2144   LEUKOCYTESUR SMALL (A) 12/28/2022 2144    Radiological Exams on Admission: CT Head Wo Contrast  Result Date: 12/28/2022 CLINICAL DATA:  Head trauma, minor (Age >= 65y), altered mental status EXAM: CT HEAD WITHOUT CONTRAST TECHNIQUE: Contiguous axial images were obtained from the base of the skull through the vertex without intravenous contrast. RADIATION DOSE REDUCTION: This exam was performed according to the departmental dose-optimization program which includes automated exposure control, adjustment of the mA and/or kV according to patient size and/or use of iterative reconstruction technique. COMPARISON:  12/03/2022 FINDINGS: Brain: Normal anatomic configuration. Parenchymal volume loss is commensurate with the patient's age. Mild periventricular white matter changes are present likely reflecting the sequela of small vessel ischemia. Remote left cerebellar infarct again noted. No abnormal intra or  extra-axial mass lesion or fluid collection. No abnormal mass effect or midline shift. No evidence of acute intracranial hemorrhage or infarct. Ventricular size is normal. Cerebellum unremarkable. Vascular: No asymmetric hyperdense vasculature at  the skull base. Skull: Intact Sinuses/Orbits: Paranasal sinuses are clear. Orbits are unremarkable. Other: Mastoid air cells and middle ear cavities are clear. IMPRESSION: 1. No acute intracranial abnormality. No calvarial fracture. 2. Stable senescent changes. 3. Remote left cerebellar infarct. Electronically Signed   By: Helyn Numbers M.D.   On: 12/28/2022 21:09   CT Thoracic Spine Wo Contrast  Result Date: 12/28/2022 CLINICAL DATA:  Spine fracture, thoracic, traumatic EXAM: CT THORACIC SPINE WITHOUT CONTRAST TECHNIQUE: Multidetector CT images of the thoracic were obtained using the standard protocol without intravenous contrast. RADIATION DOSE REDUCTION: This exam was performed according to the departmental dose-optimization program which includes automated exposure control, adjustment of the mA and/or kV according to patient size and/or use of iterative reconstruction technique. COMPARISON:  Thoracic spine CT 12/03/2022 FINDINGS: Alignment: Normal. Vertebrae: Diffuse flowing anterior osteophytes. Stable lucency through anterior osteophytes at T11 level from prior exam, series 14, image 31 and series 16, image 44. Fracture extends through the superior aspect of the T11 vertebral body, series 16, image 54. This is also unchanged. No acute fracture. Vertebral body heights are normal. Intact posterior elements. Paraspinal and other soft tissues: No paraspinal soft tissue thickening. No acute intrathoracic findings. Aortic atherosclerosis Disc levels: Diffuse anterior spurring and osteophytes. No spinal canal stenosis. IMPRESSION: 1. Unchanged fracture through anterior osteophytes at T11 and superior aspect of T11 vertebral body. No change in alignment from last month.  2. No acute fracture of the thoracic spine. Aortic Atherosclerosis (ICD10-I70.0). Electronically Signed   By: Narda Rutherford M.D.   On: 12/28/2022 20:45   CT Cervical Spine Wo Contrast  Result Date: 12/28/2022 CLINICAL DATA:  Fall in the bathroom, neck trauma. EXAM: CT CERVICAL SPINE WITHOUT CONTRAST TECHNIQUE: Multidetector CT imaging of the cervical spine was performed without intravenous contrast. Multiplanar CT image reconstructions were also generated. RADIATION DOSE REDUCTION: This exam was performed according to the departmental dose-optimization program which includes automated exposure control, adjustment of the mA and/or kV according to patient size and/or use of iterative reconstruction technique. COMPARISON:  CT cervical spine 12/03/2022 FINDINGS: Alignment: No traumatic malalignment. Chronic grade 1 anterolisthesis of C4 on C5. Skull base and vertebrae: No acute fracture. Vertebral body heights are maintained. The dens and skull base are intact. Soft tissues and spinal canal: No prevertebral fluid or swelling. No visible canal hematoma. Disc levels: Stable degree of multilevel degenerative disc disease and facet hypertrophy. Upper chest: No acute or unexpected findings. Other: None. IMPRESSION: 1. No acute fracture or traumatic malalignment of the cervical spine. 2. Stable degree of multilevel degenerative disc disease and facet hypertrophy. Electronically Signed   By: Narda Rutherford M.D.   On: 12/28/2022 20:40    EKG: Independently reviewed. See abvoe  Assessment/Plan    Symptomatic hyponatremia  - presumed due to low volume in setting HCTZ use and diarrheal illness  - admit to step down  -place on neuro checks  -continue ns as per renal  -await renal further recs  -q4h NA    HTN - holding HCTZ  -current bp stable  - prn ant-htn medications ordered   T11 fracture  -holding  methocarbamol due to confusion  from baseline  -continue LSO -tylenol  - prn norco     DMII -iss/fs -hold oral hypoglycemic medications for now   HTN   HLD -continue simvastatin   Hypothyroid -continue synthroid  -check tsh   GERD -ppi   Hx of right upper urothelial carcinoma  -s/p status post right nephrectomy  DCIS of the left breast - status postlumpectomy on letrozole   DVT prophylaxis: heparin Code Status:  Family Communication:  Disposition Plan: patient  expected to be admitted greater than 2 midnights  Consults called: renal dr Arta Silence  Admission status: sdu   Lurline Del MD [facility] Pager [phone]  If 7PM-7AM, please contact night-coverage [email or web address] Password [facility]  12/28/2022, 10:24 PM

## 2022-12-28 NOTE — ED Provider Notes (Signed)
[place] EMERGENCY DEPARTMENT AT [facility] Provider Note   CSN: [id number] Arrival date & time: 12/28/22  1749     History  Chief Complaint  Patient presents with   Fall   Altered Mental Status    Dawn Bradley is a 82 y.o. female with past medical history significant for hypertension, hyperlipidemia, osteoarthritis, diabetes, hypothyroidism, breast cancer presents to the ED after a fall at home.  Patient fell in the bathroom this afternoon and hit her head on the wall.  Patient's husband states that the patient has been having altered mental status for the past several weeks and it has been progressively worsening.  She has also had loss of appetite and has had very poor PO intake the past couple of weeks.  Patient was on Mounjaro, but discontinued this medication 10 days ago due to decreased appetite.  Per her husband, she has had alternating constipation and diarrhea.  Patient's husband states that she has not been herself since returning from [place] for rehab.  Denies shortness of breath, chest pain, nausea, vomiting, dysuria.         Home Medications Prior to Admission medications   Medication Sig Start Date End Date Taking? Authorizing Provider  acetaminophen (TYLENOL) 325 MG tablet Take 650 mg by mouth every 6 (six) hours as needed for moderate pain.   Yes [provider]  amoxicillin (AMOXIL) 500 MG capsule TAKE 4 CAPSULES 1 HOUR PRIOR TO DENTAL PROCEDURE   Yes [provider]  glipiZIDE (GLUCOTROL) 5 MG tablet Take 5 mg by mouth 2 (two) times daily. 12/09/22  Yes [provider]  HYDROcodone-acetaminophen (NORCO/VICODIN) 5-325 MG tablet Take 1 tablet by mouth every 4 (four) hours as needed for moderate pain or severe pain. 12/12/22  Yes [provider]  levothyroxine (SYNTHROID) 100 MCG tablet Take 100 mcg by mouth daily before breakfast.   Yes [provider]  lisinopril-hydrochlorothiazide (ZESTORETIC) 20-12.5 MG  tablet Take 1 tablet by mouth 2 (two) times daily. 12/15/22  Yes Marinda Elk, MD  melatonin 3 MG TABS tablet Take 1 tablet (3 mg total) by mouth at bedtime. 12/08/22  Yes Marinda Elk, MD  methocarbamol (ROBAXIN) 500 MG tablet Take 500 mg by mouth every 8 (eight) hours as needed for muscle spasms. 11/28/22  Yes [provider]  simvastatin (ZOCOR) 20 MG tablet Take 20 mg by mouth at bedtime.   Yes [provider]  tirzepatide Greggory Keen) 5 MG/0.5ML Pen Inject 5 mg into the skin once a week. 10/02/22  Yes   valACYclovir (VALTREX) 1000 MG tablet Take 1,000 mg by mouth 2 (two) times daily as needed (fever blisters).   Yes [provider]  Vitamin D, Ergocalciferol, (DRISDOL) 50000 UNITS CAPS Take 50,000 Units by mouth every 7 (seven) days. Pt takes on Saturdays.   Yes [provider]  zolpidem (AMBIEN) 10 MG tablet Take 10 mg by mouth at bedtime as needed for sleep.   Yes [provider]  letrozole (FEMARA) 2.5 MG tablet Take 1 tablet (2.5 mg total) by mouth daily. Patient not taking: Reported on 12/03/2022 11/06/15   Boelter, Gwendolyn Fill, NP  polyethylene glycol (MIRALAX / GLYCOLAX) 17 g packet Take 17 g by mouth daily. Patient not taking: Reported on 12/28/2022 12/08/22   Marinda Elk, MD      Allergies    Morphine, Pholcodine, Shellfish allergy, Shellfish-derived products, Codeine, Latex, Other, and Tramadol    Review of Systems   Review of Systems  Constitutional:  Positive for fatigue.  Respiratory:  Negative for shortness of breath.   Cardiovascular:  Negative for chest pain.  Gastrointestinal:  Positive for constipation and diarrhea. Negative for nausea and vomiting.  Genitourinary:  Negative for dysuria.  Musculoskeletal:  Positive for gait problem (unsteady).  Psychiatric/Behavioral:  Positive for confusion.     Physical Exam Updated Vital Signs BP (!) 130/93   Pulse (!) 57   Temp 98 F (36.7 C) (Oral)   Resp 13   Ht  5' 4.5" (1.638 m)   Wt 78 kg   SpO2 100%   BMI 29.07 kg/m  Physical Exam Vitals and nursing note reviewed.  Constitutional:      General: She is not in acute distress.    Appearance: She is not ill-appearing.  HENT:     Mouth/Throat:     Mouth: Mucous membranes are moist.     Pharynx: Oropharynx is clear.  Eyes:     General: Vision grossly intact.     Extraocular Movements: Extraocular movements intact.     Conjunctiva/sclera: Conjunctivae normal.     Pupils: Pupils are equal, round, and reactive to light.  Cardiovascular:     Rate and Rhythm: Normal rate and regular rhythm.     Pulses: Normal pulses.     Heart sounds: Normal heart sounds.  Pulmonary:     Effort: Pulmonary effort is normal. No respiratory distress.     Breath sounds: Normal breath sounds and air entry.  Abdominal:     General: Abdomen is flat. Bowel sounds are normal. There is no distension.     Palpations: Abdomen is soft.     Tenderness: There is no abdominal tenderness.  Skin:    General: Skin is warm and dry.     Capillary Refill: Capillary refill takes less than 2 seconds.  Neurological:     Mental Status: She is alert and oriented to person, place, and time. Mental status is at baseline.     GCS: GCS eye subscore is 4. GCS verbal subscore is 5. GCS motor subscore is 6.     Cranial Nerves: Cranial nerves 2-12 are intact.     Sensory: Sensation is intact.     Motor: Motor function is intact.     Coordination: Coordination is intact.     Comments: Cranial Nerves:  II: peripheral fields grossly intact III,IV, VI: ptosis not present, extra-ocular movements intact bilaterally, direct and consensual pupillary light reflexes intact bilaterally V: facial sensation, jaw opening, and bite strength equal bilaterally VII: eyebrow raise, eyelid close, smile, frown, pucker equal bilaterally VIII: hearing grossly normal bilaterally  IX,X: palate elevation and swallowing intact XI: bilateral shoulder shrug and  lateral head rotation equal and strong XII: midline tongue extension Motor: 5/5 strength in major muscle groups in bilateral upper and lower extremities.  Grip strength equal bilaterally.   Speech is clear and not slurred, however, patient is somewhat slow with answering questions.    Psychiatric:        Mood and Affect: Mood normal.        Behavior: Behavior is slowed.     ED Results / Procedures / Treatments   Labs (all labs ordered are listed, but only abnormal results are displayed) Labs Reviewed  COMPREHENSIVE METABOLIC PANEL - Abnormal; Notable for the following components:      Result Value   Sodium 110 (*)    Chloride 80 (*)    CO2 16 (*)    Glucose, Bld 110 (*)  BUN 25 (*)    Creatinine, Ser 1.36 (*)    GFR, Estimated 39 (*)    All other components within normal limits  URINALYSIS, ROUTINE W REFLEX MICROSCOPIC - Abnormal; Notable for the following components:   Ketones, ur 5 (*)    Leukocytes,Ua SMALL (*)    Bacteria, UA RARE (*)    All other components within normal limits  CBC WITH DIFFERENTIAL/PLATELET - Abnormal; Notable for the following components:   RBC 3.78 (*)    Hemoglobin 11.7 (*)    HCT 32.3 (*)    MCHC 36.2 (*)    All other components within normal limits  BLOOD GAS, VENOUS - Abnormal; Notable for the following components:   pH, Ven 7.47 (*)    pCO2, Ven 26 (*)    pO2, Ven <31 (*)    Bicarbonate 19.2 (*)    Acid-base deficit 3.2 (*)    All other components within normal limits  CBG MONITORING, ED - Abnormal; Notable for the following components:   Glucose-Capillary 119 (*)    All other components within normal limits  MAGNESIUM  RAPID URINE DRUG SCREEN, HOSP PERFORMED  ETHANOL  SODIUM, URINE, RANDOM  CREATININE, URINE, RANDOM  OSMOLALITY    EKG EKG Interpretation  Date/Time:  Sunday December 28 2022 18:56:06 EDT Ventricular Rate:  62 PR Interval:  182 QRS Duration: 108 QT Interval:  424 QTC Calculation: 431 R Axis:   33 Text  Interpretation: Sinus rhythm Confirmed by Vivi Barrack [phone]) on 12/28/2022 8:01:21 PM  Radiology CT Head Wo Contrast  Result Date: 12/28/2022 CLINICAL DATA:  Head trauma, minor (Age >= 65y), altered mental status EXAM: CT HEAD WITHOUT CONTRAST TECHNIQUE: Contiguous axial images were obtained from the base of the skull through the vertex without intravenous contrast. RADIATION DOSE REDUCTION: This exam was performed according to the departmental dose-optimization program which includes automated exposure control, adjustment of the mA and/or kV according to patient size and/or use of iterative reconstruction technique. COMPARISON:  12/03/2022 FINDINGS: Brain: Normal anatomic configuration. Parenchymal volume loss is commensurate with the patient's age. Mild periventricular white matter changes are present likely reflecting the sequela of small vessel ischemia. Remote left cerebellar infarct again noted. No abnormal intra or extra-axial mass lesion or fluid collection. No abnormal mass effect or midline shift. No evidence of acute intracranial hemorrhage or infarct. Ventricular size is normal. Cerebellum unremarkable. Vascular: No asymmetric hyperdense vasculature at the skull base. Skull: Intact Sinuses/Orbits: Paranasal sinuses are clear. Orbits are unremarkable. Other: Mastoid air cells and middle ear cavities are clear. IMPRESSION: 1. No acute intracranial abnormality. No calvarial fracture. 2. Stable senescent changes. 3. Remote left cerebellar infarct. Electronically Signed   By: Helyn Numbers M.D.   On: 12/28/2022 21:09   CT Thoracic Spine Wo Contrast  Result Date: 12/28/2022 CLINICAL DATA:  Spine fracture, thoracic, traumatic EXAM: CT THORACIC SPINE WITHOUT CONTRAST TECHNIQUE: Multidetector CT images of the thoracic were obtained using the standard protocol without intravenous contrast. RADIATION DOSE REDUCTION: This exam was performed according to the departmental dose-optimization program which  includes automated exposure control, adjustment of the mA and/or kV according to patient size and/or use of iterative reconstruction technique. COMPARISON:  Thoracic spine CT 12/03/2022 FINDINGS: Alignment: Normal. Vertebrae: Diffuse flowing anterior osteophytes. Stable lucency through anterior osteophytes at T11 level from prior exam, series 14, image 31 and series 16, image 44. Fracture extends through the superior aspect of the T11 vertebral body, series 16, image 54. This is also unchanged. No acute  fracture. Vertebral body heights are normal. Intact posterior elements. Paraspinal and other soft tissues: No paraspinal soft tissue thickening. No acute intrathoracic findings. Aortic atherosclerosis Disc levels: Diffuse anterior spurring and osteophytes. No spinal canal stenosis. IMPRESSION: 1. Unchanged fracture through anterior osteophytes at T11 and superior aspect of T11 vertebral body. No change in alignment from last month. 2. No acute fracture of the thoracic spine. Aortic Atherosclerosis (ICD10-I70.0). Electronically Signed   By: Narda Rutherford M.D.   On: 12/28/2022 20:45   CT Cervical Spine Wo Contrast  Result Date: 12/28/2022 CLINICAL DATA:  Fall in the bathroom, neck trauma. EXAM: CT CERVICAL SPINE WITHOUT CONTRAST TECHNIQUE: Multidetector CT imaging of the cervical spine was performed without intravenous contrast. Multiplanar CT image reconstructions were also generated. RADIATION DOSE REDUCTION: This exam was performed according to the departmental dose-optimization program which includes automated exposure control, adjustment of the mA and/or kV according to patient size and/or use of iterative reconstruction technique. COMPARISON:  CT cervical spine 12/03/2022 FINDINGS: Alignment: No traumatic malalignment. Chronic grade 1 anterolisthesis of C4 on C5. Skull base and vertebrae: No acute fracture. Vertebral body heights are maintained. The dens and skull base are intact. Soft tissues and spinal  canal: No prevertebral fluid or swelling. No visible canal hematoma. Disc levels: Stable degree of multilevel degenerative disc disease and facet hypertrophy. Upper chest: No acute or unexpected findings. Other: None. IMPRESSION: 1. No acute fracture or traumatic malalignment of the cervical spine. 2. Stable degree of multilevel degenerative disc disease and facet hypertrophy. Electronically Signed   By: Narda Rutherford M.D.   On: 12/28/2022 20:40    Procedures Procedures    Medications Ordered in ED Medications  sodium chloride 0.9 % bolus 500 mL (500 mLs Intravenous Bolus 12/28/22 2112)  acetaminophen (TYLENOL) tablet 1,000 mg (1,000 mg Oral Given 12/28/22 2112)    ED Course/ Medical Decision Making/ A&P Clinical Course as of 12/28/22 2255  Sun Dec 28, 2022  2001 Sodium(!!): 110 [HN]    Clinical Course User Index [HN] Loetta Rough, MD                             Medical Decision Making Amount and/or Complexity of Data Reviewed Labs: ordered. Decision-making details documented in ED Course. Radiology: ordered.  Risk OTC drugs.   This patient presents to the ED with chief complaint(s) of altered mental status, unsteady gait, "feeling unwell" with pertinent past medical history of HLD, HTN, hypothyroid, obesity, .  The complaint involves an extensive differential diagnosis and also carries with it a high risk of complications and morbidity.    The differential diagnosis includes metabolic derangement, encephalopathy,    The initial plan is to obtain AMS labs  Additional history obtained: Records reviewed previous admission documents - patient was admitted a couple of weeks prior for fall with new T11 fracture.  Patient did have mild hyponatremia and hyperkalemia when admitted ot the hospital.    Initial Assessment:   On exam, patient is alert and oriented to basic questions, but does appear slow to answer.  Patient's movements are slow, but neuro exam is otherwise  unremarkable.  CN2-12 grossly intact.  5/5 strength in bilateral upper and lower extremities.  Did not assess gait.  Abdomen is soft and non tender to palpation.  Heart rate is normal with regular rhythm.  Lungs clear to auscultation bilaterally.  SPO2 normal.  She is afebrile.    Independent ECG/labs interpretation:  The following labs were independently interpreted:  CBC with anemia, no leukocytosis.  Metabolic panel with critical hyponatremia at 110.  There is also hypochloremia and elevated Cr.  Anion gap normal.  No other electrolyte abnormalities.  VBG with alkalosis.  UDS negative.  Ethanol negative.  Urine sodium and creatinine both normal.  Osmolality ordered, but still pending.    Independent visualization and interpretation of imaging: I independently visualized the following imaging with scope of interpretation limited to determining acute life threatening conditions related to emergency care: CT head, c-spine, thoracic spine which revealed no acute findings.  T11 fracture is stable and unchanged from prior.   Treatment and Reassessment: Will give patient PO Tylenol for "all over pain" and NS fluids for hyponatremia.    Consultations obtained:   I requested consultation with on-call critical care provider and spoke with Dr. Everardo All who recommended normal saline fluid boluses and admitting patient to progressive floor.  If patient does not improve with NS, then may consider hypertonic fluids while patient is admitted.    I requested consultation with on-call nephrologist and spoke with Dr. Arlean Hopping who agreed with normal saline fluid boluses and labs that have already been ordered.  I spoke with on-call hospitalist provider Dr. Skip Mayer who agreed with hospital admission for hyponatremia.  Disposition:   Patient to be admitted to [facility] for treatment of hyponatremia.           Final Clinical Impression(s) / ED Diagnoses Final diagnoses:   Hyponatremia    Rx / DC Orders ED Discharge Orders     None         Lenard Simmer, PA-C 12/28/22 2303    Loetta Rough, MD 12/30/22 1309

## 2022-12-28 NOTE — ED Triage Notes (Signed)
Pt arrived via POV. Pt fell in bathroom this afternoon and hit head on wall. Pt's husband states pt has been having AMS for several weeks and it has been worsening. Loss of appetite, confusion.  Aox3

## 2022-12-29 ENCOUNTER — Encounter (HOSPITAL_COMMUNITY): Payer: Self-pay | Admitting: Internal Medicine

## 2022-12-29 DIAGNOSIS — R531 Weakness: Secondary | ICD-10-CM

## 2022-12-29 DIAGNOSIS — G9341 Metabolic encephalopathy: Secondary | ICD-10-CM | POA: Diagnosis present

## 2022-12-29 DIAGNOSIS — R296 Repeated falls: Secondary | ICD-10-CM | POA: Diagnosis present

## 2022-12-29 DIAGNOSIS — W19XXXA Unspecified fall, initial encounter: Secondary | ICD-10-CM | POA: Diagnosis present

## 2022-12-29 DIAGNOSIS — N179 Acute kidney failure, unspecified: Secondary | ICD-10-CM

## 2022-12-29 DIAGNOSIS — E785 Hyperlipidemia, unspecified: Secondary | ICD-10-CM | POA: Diagnosis present

## 2022-12-29 DIAGNOSIS — I10 Essential (primary) hypertension: Secondary | ICD-10-CM | POA: Diagnosis present

## 2022-12-29 DIAGNOSIS — E871 Hypo-osmolality and hyponatremia: Secondary | ICD-10-CM | POA: Diagnosis not present

## 2022-12-29 DIAGNOSIS — N183 Chronic kidney disease, stage 3 unspecified: Secondary | ICD-10-CM | POA: Diagnosis present

## 2022-12-29 DIAGNOSIS — E669 Obesity, unspecified: Secondary | ICD-10-CM | POA: Diagnosis present

## 2022-12-29 LAB — MRSA NEXT GEN BY PCR, NASAL: MRSA by PCR Next Gen: NOT DETECTED

## 2022-12-29 LAB — COMPREHENSIVE METABOLIC PANEL
ALT: 19 U/L (ref 0–44)
AST: 20 U/L (ref 15–41)
Albumin: 3.9 g/dL (ref 3.5–5.0)
Alkaline Phosphatase: 55 U/L (ref 38–126)
Anion gap: 11 (ref 5–15)
BUN: 23 mg/dL (ref 8–23)
CO2: 18 mmol/L — ABNORMAL LOW (ref 22–32)
Calcium: 8.8 mg/dL — ABNORMAL LOW (ref 8.9–10.3)
Chloride: 85 mmol/L — ABNORMAL LOW (ref 98–111)
Creatinine, Ser: 1.29 mg/dL — ABNORMAL HIGH (ref 0.44–1.00)
GFR, Estimated: 41 mL/min — ABNORMAL LOW (ref >60)
Glucose, Bld: 94 mg/dL (ref 70–99)
Potassium: 4.2 mmol/L (ref 3.5–5.1)
Sodium: 114 mmol/L — CL (ref 135–145)
Total Bilirubin: 0.9 mg/dL (ref 0.3–1.2)
Total Protein: 6.5 g/dL (ref 6.5–8.1)

## 2022-12-29 LAB — BASIC METABOLIC PANEL
Anion gap: 10 (ref 5–15)
BUN: 19 mg/dL (ref 8–23)
CO2: 17 mmol/L — ABNORMAL LOW (ref 22–32)
Calcium: 8.4 mg/dL — ABNORMAL LOW (ref 8.9–10.3)
Chloride: 89 mmol/L — ABNORMAL LOW (ref 98–111)
Creatinine, Ser: 1.19 mg/dL — ABNORMAL HIGH (ref 0.44–1.00)
GFR, Estimated: 45 mL/min — ABNORMAL LOW (ref >60)
Glucose, Bld: 131 mg/dL — ABNORMAL HIGH (ref 70–99)
Potassium: 4.1 mmol/L (ref 3.5–5.1)
Sodium: 116 mmol/L — CL (ref 135–145)

## 2022-12-29 LAB — CBC
HCT: 30.6 % — ABNORMAL LOW (ref 36.0–46.0)
HCT: 31.5 % — ABNORMAL LOW (ref 36.0–46.0)
Hemoglobin: 11.2 g/dL — ABNORMAL LOW (ref 12.0–15.0)
Hemoglobin: 11.3 g/dL — ABNORMAL LOW (ref 12.0–15.0)
MCH: 31.2 pg (ref 26.0–34.0)
MCH: 30.9 pg (ref 26.0–34.0)
MCHC: 36.6 g/dL — ABNORMAL HIGH (ref 30.0–36.0)
MCHC: 35.9 g/dL (ref 30.0–36.0)
MCV: 85.2 fL (ref 80.0–100.0)
MCV: 86.1 fL (ref 80.0–100.0)
Platelets: 256 10*3/uL (ref 150–400)
Platelets: 257 10*3/uL (ref 150–400)
RBC: 3.59 MIL/uL — ABNORMAL LOW (ref 3.87–5.11)
RBC: 3.66 MIL/uL — ABNORMAL LOW (ref 3.87–5.11)
RDW: 12.3 % (ref 11.5–15.5)
RDW: 12.4 % (ref 11.5–15.5)
WBC: 8.5 10*3/uL (ref 4.0–10.5)
WBC: 8.1 10*3/uL (ref 4.0–10.5)
nRBC: 0 % (ref 0.0–0.2)
nRBC: 0 % (ref 0.0–0.2)

## 2022-12-29 LAB — SODIUM
Sodium: 114 mmol/L — CL (ref 135–145)
Sodium: 117 mmol/L — CL (ref 135–145)
Sodium: 119 mmol/L — CL (ref 135–145)
Sodium: 119 mmol/L — CL (ref 135–145)

## 2022-12-29 LAB — CREATININE, SERUM
Creatinine, Ser: 1.41 mg/dL — ABNORMAL HIGH (ref 0.44–1.00)
GFR, Estimated: 37 mL/min — ABNORMAL LOW (ref >60)

## 2022-12-29 LAB — GLUCOSE, CAPILLARY
Glucose-Capillary: 148 mg/dL — ABNORMAL HIGH (ref 70–99)
Glucose-Capillary: 109 mg/dL — ABNORMAL HIGH (ref 70–99)

## 2022-12-29 LAB — TSH: TSH: 0.3 u[IU]/mL — ABNORMAL LOW (ref 0.350–4.500)

## 2022-12-29 LAB — OSMOLALITY
Osmolality: 251 mOsm/kg — ABNORMAL LOW (ref 275–295)
Osmolality: 248 mOsm/kg — CL (ref 275–295)

## 2022-12-29 LAB — CBG MONITORING, ED: Glucose-Capillary: 108 mg/dL — ABNORMAL HIGH (ref 70–99)

## 2022-12-29 LAB — HEMOGLOBIN A1C
Hgb A1c MFr Bld: 6.2 % — ABNORMAL HIGH (ref 4.8–5.6)
Mean Plasma Glucose: 131.24 mg/dL

## 2022-12-29 LAB — OSMOLALITY, URINE: Osmolality, Ur: 225 mOsm/kg — ABNORMAL LOW (ref 300–900)

## 2022-12-29 LAB — T4, FREE: Free T4: 1.45 ng/dL — ABNORMAL HIGH (ref 0.61–1.12)

## 2022-12-29 MED ORDER — HYDROCODONE-ACETAMINOPHEN 5-325 MG PO TABS
1.0000 | ORAL_TABLET | ORAL | Status: DC | PRN
  Administered 2022-12-29 – 2023-01-02 (×5): 1 via ORAL
  Filled 2022-12-29 (×5): qty 1

## 2022-12-29 MED ORDER — CHLORHEXIDINE GLUCONATE CLOTH 2 % EX PADS
6.0000 | MEDICATED_PAD | Freq: Every day | CUTANEOUS | Status: DC
  Administered 2022-12-29 – 2023-01-01 (×4): 6 via TOPICAL

## 2022-12-29 MED ORDER — ZOLPIDEM TARTRATE 5 MG PO TABS
5.0000 mg | ORAL_TABLET | Freq: Once | ORAL | Status: AC
  Administered 2022-12-29: 5 mg via ORAL
  Filled 2022-12-29: qty 1

## 2022-12-29 MED ORDER — ENSURE ENLIVE PO LIQD
237.0000 mL | Freq: Two times a day (BID) | ORAL | Status: DC
  Administered 2022-12-29 – 2023-01-01 (×4): 237 mL via ORAL

## 2022-12-29 MED ORDER — HYDRALAZINE HCL 20 MG/ML IJ SOLN: 10.0000 mg | INTRAMUSCULAR | Status: DC | PRN

## 2022-12-29 MED ORDER — LEVOTHYROXINE SODIUM 88 MCG PO TABS
88.0000 ug | ORAL_TABLET | Freq: Every day | ORAL | Status: DC
  Administered 2022-12-30 – 2023-01-02 (×4): 88 ug via ORAL
  Filled 2022-12-29 (×4): qty 1

## 2022-12-29 MED ORDER — SODIUM CHLORIDE 0.9 % IV SOLN: INTRAVENOUS | Status: DC

## 2022-12-29 MED ORDER — ORAL CARE MOUTH RINSE: 15.0000 mL | OROMUCOSAL | Status: DC | PRN

## 2022-12-29 NOTE — Consult Note (Signed)
Renal Service Consult Note [facility]  ALGIE NGAI 12/29/2022 Maree Krabbe, MD Requesting Physician: Dr. Maisie Fus   Reason for Consult: Hyponatremia HPI: The patient is a 82 y.o. year-old w/ PMH as below who presented to ED after falling at home and hitting her head on the wall. Per husband pt had been having AMS for 1-2 wks and worsening. Lost her appetite as well, probably related to [facility] therapy. Hx of nephrectomy R, DM2, DCIS L breast sp lumpectomy. Recently in hospital after fall w/ T11 spinal fracture. In ED VSS, sat 100%, Na 110, CO2 16  , creat 1.36, Hb 11.7.  UDS neg. UA neg. UNa 29. CT head negative. Pt was admitted and was felt to look volume depleted so was started on 0.9% saline at 100 cc/hr. We are asked to see for hyponatremia.   Pt's b/l creatinine is 1.2- 1.5.  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 She denies hx of hyponatremia. She is starting to feel better today, less confused. She takes combination acei/ hctz as BP medication at home.   Per the husband the patient just stopped eating / drinking after taking the [facility] a couple of weeks ago. Then she just kept going downhill w/ confusion and imbalance.   ROS - denies CP, no joint pain, no HA, no blurry vision, no rash, no diarrhea, no nausea/ vomiting, no dysuria, no difficulty voiding   Past Medical History  Past Medical History:  Diagnosis Date   Abnormal radiologic findings on diagnostic imaging of right kidney    Angina at rest    Atherosclerosis of aorta (HCC)    Breast cancer (HCC)    Cancer (HCC)    lt breast   Chest discomfort    Diabetes mellitus    pre-diabetic   DOE (dyspnea on exertion)    GERD (gastroesophageal reflux disease)    Hyperlipidemia    Hypertension    Hypothyroid    Insomnia    Obesity    Osteoarthritis    Personal history of malignant neoplasm of breast    Personal history of radiation therapy     Spinal stenosis of lumbar region, unspecified whether neurogenic claudication present    Swelling of lower extremity    Vitamin D deficiency    Past Surgical History  Past Surgical History:  Procedure Laterality Date   ANTERIOR CRUCIATE LIGAMENT REPAIR  1960   APPENDECTOMY  1962   BREAST LUMPECTOMY  06/11/2011   Procedure: LUMPECTOMY;  Surgeon: Robyne Askew, MD;  Location: [facility];  Service: General;  Laterality: Left;  Left Needle localization lumpectomy   CHOLECYSTECTOMY  1971   JOINT REPLACEMENT     both knees 2/12   REPLACEMENT TOTAL KNEE BILATERAL  09/06/10   TUBAL LIGATION  1980   Family History  Family History  Problem Relation Age of Onset   Cancer Father        kidney   Breast cancer Sister    Cancer Sister        breast   Social History  reports that she has quit smoking. She does not have any smokeless tobacco history on file. She reports current alcohol use. She reports that she does not use drugs. Allergies  Allergies  Allergen Reactions   Morphine Nausea And Vomiting, Other (See Comments) and Shortness Of Breath    Reaction: Heart races irregularly.  Patient states she is not allergic to Morphine  03-06-16.   Pholcodine     Other Reaction(s): respiratory distress   Shellfish Allergy Hives and Swelling   Shellfish-Derived Products Hives, Other (See Comments) and Swelling    Reaction: Red face   Codeine Other (See Comments)    REACTION: heart races  REACTION: heart races  REACTION: Heart races irregularly, vomiting and shortness of breath   Latex Itching   Other Other (See Comments)    Faux gold causes itching and skin to turn green   Tramadol Nausea And Vomiting   Home medications Prior to Admission medications   Medication Sig Start Date End Date Taking? Authorizing Provider  acetaminophen (TYLENOL) 325 MG tablet Take 650 mg by mouth every 6 (six) hours as needed for moderate pain.   Yes [provider]  amoxicillin (AMOXIL)  500 MG capsule TAKE 4 CAPSULES 1 HOUR PRIOR TO DENTAL PROCEDURE   Yes [provider]  glipiZIDE (GLUCOTROL) 5 MG tablet Take 5 mg by mouth 2 (two) times daily. 12/09/22  Yes [provider]  HYDROcodone-acetaminophen (NORCO/VICODIN) 5-325 MG tablet Take 1 tablet by mouth every 4 (four) hours as needed for moderate pain or severe pain. 12/12/22  Yes [provider]  levothyroxine (SYNTHROID) 100 MCG tablet Take 100 mcg by mouth daily before breakfast.   Yes [provider]  lisinopril-hydrochlorothiazide (ZESTORETIC) 20-12.5 MG tablet Take 1 tablet by mouth 2 (two) times daily. 12/15/22  Yes Marinda Elk, MD  melatonin 3 MG TABS tablet Take 1 tablet (3 mg total) by mouth at bedtime. 12/08/22  Yes Marinda Elk, MD  methocarbamol (ROBAXIN) 500 MG tablet Take 500 mg by mouth every 8 (eight) hours as needed for muscle spasms. 11/28/22  Yes [provider]  simvastatin (ZOCOR) 20 MG tablet Take 20 mg by mouth at bedtime.   Yes [provider]  tirzepatide Greggory Keen) 5 MG/0.5ML Pen Inject 5 mg into the skin once a week. 10/02/22  Yes   valACYclovir (VALTREX) 1000 MG tablet Take 1,000 mg by mouth 2 (two) times daily as needed (fever blisters).   Yes [provider]  Vitamin D, Ergocalciferol, (DRISDOL) 50000 UNITS CAPS Take 50,000 Units by mouth every 7 (seven) days. Pt takes on Saturdays.   Yes [provider]  zolpidem (AMBIEN) 10 MG tablet Take 10 mg by mouth at bedtime as needed for sleep.   Yes [provider]  letrozole (FEMARA) 2.5 MG tablet Take 1 tablet (2.5 mg total) by mouth daily. Patient not taking: Reported on 12/03/2022 11/06/15   Boelter, Gwendolyn Fill, NP  polyethylene glycol (MIRALAX / GLYCOLAX) 17 g packet Take 17 g by mouth daily. Patient not taking: Reported on 12/28/2022 12/08/22   Marinda Elk, MD     Vitals:   12/29/22 1049 12/29/22 1200 12/29/22 1300 12/29/22 1400  BP:  134/61  137/68   Pulse:  61 68 63  Resp:  16 18 17   Temp: 97.7 F (36.5 C) (!) 97.4 F (36.3 C)    TempSrc: Oral Oral    SpO2:  100% 100% 100%  Weight:  74.9 kg    Height:  5\' 4"  (1.626 m)     Exam Gen alert, no distress No rash, cyanosis or gangrene Sclera anicteric, throat clear  No jvd or bruits Chest clear bilat to bases, no rales/ wheezing RRR no MRG Abd soft ntnd no mass or ascites +bs GU defer MS no joint effusions or deformity Ext no LE or UE edema, no wounds or ulcers Neuro  is alert, Ox 3 , nf    Home meds include - mounjaro, valtrex, glipizide, norco, synthroid, lisnopril-hctz bid, robaxin, simvastatin, femara, zocor, ambien, prns/ vits/ supps    VS --> 130/ 80, HR 55-70, RR 16- 20, afeb, RA 100%      Today labs --> Na 117  K+ 4.2 CO2 18 Cl 85  BUN 23  creat 1.29, 1.41  alb 3.9    Serum osm 248   Hb 11  wbc 8K        UA 0-5 rbc/ 11-20 wbc /prot neg     Drug screen neg      Etoh < 10  Assessment/ Plan: Hyponatremia - w/ low UNa and hx of poor po intake for last 1-2 weeks related possibly to [facility] therapy. Pt also taking acei and hctz combination product. No SSRI's. VS are stable, no edema or signs of cirrhosis/ heart failure. 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 On exam looks potentially dry . Suspect this is mostly hypovolemia-related + addition of acei and hctz effects. Na+ improving w/ NS 0.9%, would continue at 100 cc/hr for now. Continue to hold acei/ hctz and wuold avoid thiazides in the future for BP control, and possibly acei too. Cont Na+ levels every 4 hrs until Na+ 120 then q 6 hrs. Will follow.  AMS/ falls - likely related to low Na+  HTN - BP's controlled, as above would pick new BP lowering meds if needed and avoid hctz for good and acei's for now.  DM2 - on po medications Recent TM11 fracture CKD 3b - b/l creat 1.2- 1.5. Stable here.       Vinson Moselle  MD [facility] 12/29/2022, 2:43 PM  Recent Labs  Lab  12/28/22 1845 12/28/22 2107 12/29/22 0355 12/29/22 0636  HGB  --    < > 11.2* 11.3*  ALBUMIN 4.1  --  3.9  --   CALCIUM 9.1  --  8.8*  --   CREATININE 1.36*  --  1.29* 1.41*  K 4.8  --  4.2  --    < > = values in this interval not displayed.   Inpatient medications:  Chlorhexidine Gluconate Cloth  6 each Topical Daily   feeding supplement  237 mL Oral BID BM   heparin  5,000 Units Subcutaneous Q8H   insulin aspart  0-6 Units Subcutaneous TID WC   [START ON 12/30/2022] levothyroxine  88 mcg Oral Q0600   simvastatin  20 mg Oral QHS    sodium chloride 100 mL/hr at 12/29/22 1215   acetaminophen, albuterol, hydrALAZINE, HYDROcodone-acetaminophen, ondansetron **OR** ondansetron (ZOFRAN) IV, mouth rinse

## 2022-12-29 NOTE — Progress Notes (Signed)
PT Cancellation Note  Patient Details Name: Dawn Bradley MRN: [id number] DOB: 1940/07/31   Cancelled Treatment:    Reason Eval/Treat Not Completed: Medical issues which prohibited therapy. Very low sodium level of 117-pt transferred to ICU from ED. Will hold PT today and check back another day.    Faye Ramsay, PT Acute Rehabilitation  Office: [phone]

## 2022-12-29 NOTE — Progress Notes (Signed)
[facility]                                                                              Dawn Bradley, is a 82 y.o. 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  Patient presented to the ED with fall and confusion.  Patient's husband reported that she fell while in the bathroom and hit her head.  She also has not been herself in the last several weeks, with increasing falls, poor appetite and confusion.  She also reported neck and mid back pain.  She was on [facility], discontinued this medication 10 days ago due to decreased appetite and very poor p.o. intake in the last couple of weeks.  Patient was having alternating constipation and diarrhea.  Per patient, she has not been herself since returning from rehab In ED, vital signs stable  Sodium 110, chloride 80, bicarb 16, BUN 25, creatinine 1.36, serum osmolarity 251 CBC WNL, hemoglobin 11.7 Patient received 500 cc fluid bolus and was started on normal saline 50 cc an hour, admitted for further workup nephrology consulted  Assessment & Plan    Principal Problem: Acute on chronic hyponatremia, hypoosmolar -Baseline hemoglobin has been running low in the last month noted during the previous hospitalization.  Sodium was 126 on 12/08/2022 -Likely prerenal due to reduced p.o. intake in the last couple of weeks, decreased appetite, HCTZ -Received 500 cc bolus in ED and was placed on normal saline IV fluids at 50 cc an hour,  will increase fluids to 100 cc an hour, repeat sodium in 4 hours. -Nephrology was consulted by EDP, 3% NS was not recommended  -Follow repeat labs this morning, serum osmolality, urine osmolality, TSH.  Urine sodium 29 -Continue IVF, serial sodium, nephrology consulted  Active Problems: Acute kidney injury on CKD stage IIIb, has history of nephrectomy, NAG metabolic acidosis -Creatinine 2.95 on admission, trended up to 1.4 today, increase IV fluids -Likely due to lisinopril, HCTZ, poor p.o. intake, Mounjaro -Continue IV fluids  Hypothyroidism -TSH 0.3, reduced Synthroid to 88 mcg daily (was on 100 mcg daily)    Ductal carcinoma in situ (DCIS) of left breast -Outpatient follow-up with [facility]  Acute metabolic encephalopathy -Likely due to #1, follow closely with sodium correction -CT head showed no acute intracranial abnormality, remote left cerebellar infarct -UA negative for UTI    Generalized weakness, Falls, recent T11 fracture (previous admission) -Continue PT OT once able to tolerate -Neurosurgery was consulted and had recommended conservative management, follow-up outpatient -She was placed on TLSO brace, pain control    Essential hypertension -BP stable, continue to hold lisinopril  HCTZ    Hyperlipidemia -Continue simvastatin    Obesity Estimated body mass index is 29.07 kg/m as calculated from the following:   Height as of this encounter: 5' 4.5" (1.638 m).   Weight as of this encounter: 78 kg.  Code Status: Full CODE STATUS DVT Prophylaxis:  heparin injection 5,000 Units Start: 12/29/22 0600   Level of Care: Level of care: Stepdown Family Communication:  Disposition Plan:      Remains inpatient appropriate: Sodium still critically low   Procedures:  None  Consultants:   Nephrology  Antimicrobials: None    Medications  heparin  5,000 Units Subcutaneous Q8H   insulin aspart  0-6 Units Subcutaneous TID WC   levothyroxine  100 mcg Oral Q0600    simvastatin  20 mg Oral QHS      Subjective:   Dawn Bradley was seen and examined today.  Still somewhat confused, oriented to self and place.  Still feels very weak, no active nausea vomiting, diarrhea, abdominal pain.  No fevers.  Has back pain.  Objective:   Vitals:   12/29/22 0204 12/29/22 0615 12/29/22 0900 12/29/22 0915  BP:  (!) 144/70 127/63 132/66  Pulse:  (!) 56 (!) 55 (!) 57  Resp:  15 13 16   Temp: 97.9 F (36.6 C) 97.9 F (36.6 C)    TempSrc: Oral Oral    SpO2:  100% 100% 100%  Weight:      Height:       No intake or output data in the 24 hours ending 12/29/22 1045   Wt Readings from Last 3 Encounters:  12/28/22 78 kg  12/03/22 90.7 kg  05/30/22 97.1 kg     Exam General: Alert and oriented x self and place, NAD Cardiovascular: S1 S2 auscultated,  RRR Respiratory: CTAB Gastrointestinal: Soft, nontender, nondistended, + bowel sounds Ext: no pedal edema bilaterally Neuro: no new deficits Psych: still somewhat confused    Data Reviewed:  I have personally reviewed following labs    CBC Lab Results  Component Value Date   WBC 8.1 12/29/2022   RBC 3.66 (L) 12/29/2022   HGB 11.3 (L) 12/29/2022   HCT 31.5 (L) 12/29/2022   MCV 86.1 12/29/2022   MCH 30.9 12/29/2022   PLT 257 12/29/2022   MCHC 35.9 12/29/2022   RDW 12.4 12/29/2022   LYMPHSABS 1.2 12/28/2022   MONOABS 0.8 12/28/2022   EOSABS 0.0 12/28/2022   BASOSABS 0.0 12/28/2022     Last metabolic panel Lab Results  Component Value Date   NA 114 (LL) 12/29/2022   K 4.2 12/29/2022   CL 85 (L) 12/29/2022   CO2 18 (L) 12/29/2022   BUN 23 12/29/2022   CREATININE 1.41 (H) 12/29/2022   GLUCOSE 94 12/29/2022   GFRNONAA 37 (L) 12/29/2022   GFRAA >90 06/25/2011   CALCIUM 8.8 (L) 12/29/2022   PROT 6.5 12/29/2022   ALBUMIN 3.9 12/29/2022   BILITOT 0.9 12/29/2022   ALKPHOS 55 12/29/2022   AST 20 12/29/2022   ALT 19 12/29/2022   ANIONGAP 11 12/29/2022    CBG (last 3)  Recent Labs     12/28/22 1916 12/29/22 0758  GLUCAP 119* 108*      Coagulation Profile: No results for input(s): "INR", "PROTIME" in the last 168 hours.   Radiology Studies: I have personally reviewed the imaging studies  CT Head Wo Contrast  Result Date: 12/28/2022 CLINICAL DATA:  Head trauma, minor (Age >= 65y), altered mental status EXAM: CT HEAD WITHOUT CONTRAST TECHNIQUE: Contiguous  axial images were obtained from the base of the skull through the vertex without intravenous contrast. RADIATION DOSE REDUCTION: This exam was performed according to the departmental dose-optimization program which includes automated exposure control, adjustment of the mA and/or kV according to patient size and/or use of iterative reconstruction technique. COMPARISON:  12/03/2022 FINDINGS: Brain: Normal anatomic configuration. Parenchymal volume loss is commensurate with the patient's age. Mild periventricular white matter changes are present likely reflecting the sequela of small vessel ischemia. Remote left cerebellar infarct again noted. No abnormal intra or extra-axial mass lesion or fluid collection. No abnormal mass effect or midline shift. No evidence of acute intracranial hemorrhage or infarct. Ventricular size is normal. Cerebellum unremarkable. Vascular: No asymmetric hyperdense vasculature at the skull base. Skull: Intact Sinuses/Orbits: Paranasal sinuses are clear. Orbits are unremarkable. Other: Mastoid air cells and middle ear cavities are clear. IMPRESSION: 1. No acute intracranial abnormality. No calvarial fracture. 2. Stable senescent changes. 3. Remote left cerebellar infarct. Electronically Signed   By: Helyn Numbers M.D.   On: 12/28/2022 21:09   CT Thoracic Spine Wo Contrast  Result Date: 12/28/2022 CLINICAL DATA:  Spine fracture, thoracic, traumatic EXAM: CT THORACIC SPINE WITHOUT CONTRAST TECHNIQUE: Multidetector CT images of the thoracic were obtained using the standard protocol without intravenous contrast.  RADIATION DOSE REDUCTION: This exam was performed according to the departmental dose-optimization program which includes automated exposure control, adjustment of the mA and/or kV according to patient size and/or use of iterative reconstruction technique. COMPARISON:  Thoracic spine CT 12/03/2022 FINDINGS: Alignment: Normal. Vertebrae: Diffuse flowing anterior osteophytes. Stable lucency through anterior osteophytes at T11 level from prior exam, series 14, image 31 and series 16, image 44. Fracture extends through the superior aspect of the T11 vertebral body, series 16, image 54. This is also unchanged. No acute fracture. Vertebral body heights are normal. Intact posterior elements. Paraspinal and other soft tissues: No paraspinal soft tissue thickening. No acute intrathoracic findings. Aortic atherosclerosis Disc levels: Diffuse anterior spurring and osteophytes. No spinal canal stenosis. IMPRESSION: 1. Unchanged fracture through anterior osteophytes at T11 and superior aspect of T11 vertebral body. No change in alignment from last month. 2. No acute fracture of the thoracic spine. Aortic Atherosclerosis (ICD10-I70.0). Electronically Signed   By: Narda Rutherford M.D.   On: 12/28/2022 20:45   CT Cervical Spine Wo Contrast  Result Date: 12/28/2022 CLINICAL DATA:  Fall in the bathroom, neck trauma. EXAM: CT CERVICAL SPINE WITHOUT CONTRAST TECHNIQUE: Multidetector CT imaging of the cervical spine was performed without intravenous contrast. Multiplanar CT image reconstructions were also generated. RADIATION DOSE REDUCTION: This exam was performed according to the departmental dose-optimization program which includes automated exposure control, adjustment of the mA and/or kV according to patient size and/or use of iterative reconstruction technique. COMPARISON:  CT cervical spine 12/03/2022 FINDINGS: Alignment: No traumatic malalignment. Chronic grade 1 anterolisthesis of C4 on C5. Skull base and vertebrae: No acute  fracture. Vertebral body heights are maintained. The dens and skull base are intact. Soft tissues and spinal canal: No prevertebral fluid or swelling. No visible canal hematoma. Disc levels: Stable degree of multilevel degenerative disc disease and facet hypertrophy. Upper chest: No acute or unexpected findings. Other: None. IMPRESSION: 1. No acute fracture or traumatic malalignment of the cervical spine. 2. Stable degree of multilevel degenerative disc disease and facet hypertrophy. Electronically Signed   By: Narda Rutherford M.D.   On: 12/28/2022 20:40       Rondalyn Belford M.D. [facility] 12/29/2022, 10:45 AM  Available via Epic secure chat 7am-7pm After 7 pm, please refer to night coverage provider listed on amion.

## 2022-12-30 DIAGNOSIS — N183 Chronic kidney disease, stage 3 unspecified: Secondary | ICD-10-CM | POA: Diagnosis not present

## 2022-12-30 DIAGNOSIS — G9341 Metabolic encephalopathy: Secondary | ICD-10-CM | POA: Diagnosis not present

## 2022-12-30 DIAGNOSIS — E871 Hypo-osmolality and hyponatremia: Secondary | ICD-10-CM | POA: Diagnosis not present

## 2022-12-30 DIAGNOSIS — N179 Acute kidney failure, unspecified: Secondary | ICD-10-CM | POA: Diagnosis not present

## 2022-12-30 LAB — CBC
HCT: 26 % — ABNORMAL LOW (ref 36.0–46.0)
Hemoglobin: 9 g/dL — ABNORMAL LOW (ref 12.0–15.0)
MCH: 31 pg (ref 26.0–34.0)
MCHC: 34.6 g/dL (ref 30.0–36.0)
MCV: 89.7 fL (ref 80.0–100.0)
Platelets: 208 10*3/uL (ref 150–400)
RBC: 2.9 MIL/uL — ABNORMAL LOW (ref 3.87–5.11)
RDW: 12.9 % (ref 11.5–15.5)
WBC: 6.5 10*3/uL (ref 4.0–10.5)
nRBC: 0 % (ref 0.0–0.2)

## 2022-12-30 LAB — GLUCOSE, CAPILLARY
Glucose-Capillary: 100 mg/dL — ABNORMAL HIGH (ref 70–99)
Glucose-Capillary: 117 mg/dL — ABNORMAL HIGH (ref 70–99)
Glucose-Capillary: 133 mg/dL — ABNORMAL HIGH (ref 70–99)
Glucose-Capillary: 167 mg/dL — ABNORMAL HIGH (ref 70–99)
Glucose-Capillary: 99 mg/dL (ref 70–99)

## 2022-12-30 LAB — BASIC METABOLIC PANEL
Anion gap: 5 (ref 5–15)
BUN: 15 mg/dL (ref 8–23)
CO2: 16 mmol/L — ABNORMAL LOW (ref 22–32)
Calcium: 6.7 mg/dL — ABNORMAL LOW (ref 8.9–10.3)
Chloride: 106 mmol/L (ref 98–111)
Creatinine, Ser: 0.93 mg/dL (ref 0.44–1.00)
GFR, Estimated: >60 mL/min (ref >60)
Glucose, Bld: 98 mg/dL (ref 70–99)
Potassium: 3.2 mmol/L — ABNORMAL LOW (ref 3.5–5.1)
Sodium: 127 mmol/L — ABNORMAL LOW (ref 135–145)

## 2022-12-30 LAB — SODIUM
Sodium: 122 mmol/L — ABNORMAL LOW (ref 135–145)
Sodium: 127 mmol/L — ABNORMAL LOW (ref 135–145)

## 2022-12-30 MED ORDER — POTASSIUM CHLORIDE CRYS ER 20 MEQ PO TBCR
40.0000 meq | EXTENDED_RELEASE_TABLET | Freq: Once | ORAL | Status: AC
  Administered 2022-12-30: 40 meq via ORAL
  Filled 2022-12-30: qty 2

## 2022-12-30 MED ORDER — SODIUM CHLORIDE 0.9 % IV BOLUS
500.0000 mL | Freq: Once | INTRAVENOUS | Status: AC
  Administered 2022-12-30: 500 mL via INTRAVENOUS

## 2022-12-30 MED ORDER — ZOLPIDEM TARTRATE 5 MG PO TABS
5.0000 mg | ORAL_TABLET | Freq: Once | ORAL | Status: AC
  Administered 2022-12-30: 5 mg via ORAL
  Filled 2022-12-30: qty 1

## 2022-12-30 MED ORDER — SODIUM CHLORIDE 0.9 % IV SOLN: INTRAVENOUS | Status: DC

## 2022-12-30 MED ORDER — SODIUM CHLORIDE 0.9 % IV BOLUS: 1500.0000 mL | Freq: Once | INTRAVENOUS | Status: DC

## 2022-12-30 NOTE — Progress Notes (Signed)
Dawn Bradley                                                                              Dawn Bradley, is a 82 y.o. 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  Patient presented to the ED with fall and confusion.  Patient's husband reported that she fell while in the bathroom and hit her head.  She also has not been herself in the last several weeks, with increasing falls, poor appetite and confusion.  She also reported neck and mid back pain.  She was on [facility], discontinued this medication 10 days ago due to decreased appetite and very poor p.o. intake in the last couple of weeks.  Patient was having alternating constipation and diarrhea.  Per patient, she has not been herself since returning from rehab In ED, vital signs stable  Sodium 110, chloride 80, bicarb 16, BUN 25, creatinine 1.36, serum osmolarity 251 CBC WNL, hemoglobin 11.7 Patient received 500 cc fluid bolus and was started on normal saline 50 cc an hour, admitted for further workup nephrology consulted  Assessment & Plan    Principal Problem: Acute on chronic hyponatremia, hypoosmolar - Sodium was 126 on 12/08/2022.  Presented with sodium of 110 -Likely prerenal due to reduced p.o. intake in the last couple of weeks, decreased appetite, HCTZ -Nephrology following -Serum osmolarity 248 urine osmolarity 225, urine sodium 29, urine creatinine 64 -Placed on 0.9% normal saline, sodium slowly trended  up, this morning noted to be 127, fluids stopped  - sodium repeated down to 122, IVF resumed  Active Problems: Acute kidney injury on CKD stage IIIb, has history of nephrectomy, NAG metabolic acidosis -Creatinine 8.11 on admission, trended up to 1.4  -Likely due to lisinopril, HCTZ, poor p.o. intake, Mounjaro -Continue IV fluids, creatinine improved to 0.93  Acute metabolic encephalopathy --CT head showed no acute intracranial abnormality, remote left cerebellar infarct -UA negative for UTI -Still quite confused despite sodium correction.  Will obtain MRI of brain to rule out any underlying intracranial abnormality/stroke  Hypothyroidism -TSH 0.3, reduced Synthroid to 88 mcg daily (was on 100 mcg daily)    Ductal carcinoma in situ (DCIS) of left breast -Outpatient follow-up with [facility]    Generalized weakness, Falls, recent T11 fracture (previous admission) -Continue PT OT once able to tolerate -Neurosurgery was consulted and had recommended conservative management, follow-up outpatient -She was placed on TLSO brace, pain control    Essential hypertension -BP stable, continue to hold lisinopril HCTZ    Hyperlipidemia -Continue simvastatin    Obesity Estimated body mass index is 33.04 kg/m as calculated from  the following:   Height as of this encounter: 5\' 4"  (1.626 m).   Weight as of this encounter: 87.3 kg.  Code Status: Full CODE STATUS DVT Prophylaxis:  heparin injection 5,000 Units Start: 12/29/22 0600   Level of Care: Level of care: Stepdown Family Communication: Updated patient's husband on the phone today Disposition Plan:      Remains inpatient appropriate: Sodium still critically low   Procedures:  None  Consultants:   Nephrology  Antimicrobials: None    Medications  Chlorhexidine Gluconate Cloth  6 each Topical Daily   feeding supplement  237 mL Oral BID BM   heparin  5,000 Units Subcutaneous Q8H   insulin aspart  0-6 Units Subcutaneous TID WC    levothyroxine  88 mcg Oral Q0600   simvastatin  20 mg Oral QHS      Subjective:   Quinterra Henkelman was seen and examined today.  Alert and pleasant however still confused, oriented to self.  Sitter at the bedside.  No active nausea vomiting, abdominal pain or diarrhea.  No fevers.   Objective:   Vitals:   12/30/22 0400 12/30/22 0500 12/30/22 0800 12/30/22 1144  BP: (!) 139/94     Pulse: 68     Resp: 18     Temp: 97.7 F (36.5 C)  98.2 F (36.8 C) 98.6 F (37 C)  TempSrc: Oral  Oral Oral  SpO2: 100%     Weight:  87.3 kg    Height:        Intake/Output Summary (Last 24 hours) at 12/30/2022 1403 Last data filed at 12/30/2022 1223 Gross per 24 hour  Intake 2403.02 ml  Output --  Net 2403.02 ml     Wt Readings from Last 3 Encounters:  12/30/22 87.3 kg  12/03/22 90.7 kg  05/30/22 97.1 kg    Physical Exam General: Alert but disoriented NAD Cardiovascular: S1 S2 clear, RRR.  Respiratory: CTAB Gastrointestinal: Soft, nontender, nondistended, NBS Ext: no pedal edema bilaterally Neuro: strength 5/5 in upper and lower extremities, no dysarthria Psych: confused    Data Reviewed:  I have personally reviewed following labs    CBC Lab Results  Component Value Date   WBC 6.5 12/30/2022   RBC 2.90 (L) 12/30/2022   HGB 9.0 (L) 12/30/2022   HCT 26.0 (L) 12/30/2022   MCV 89.7 12/30/2022   MCH 31.0 12/30/2022   PLT 208 12/30/2022   MCHC 34.6 12/30/2022   RDW 12.9 12/30/2022   LYMPHSABS 1.2 12/28/2022   MONOABS 0.8 12/28/2022   EOSABS 0.0 12/28/2022   BASOSABS 0.0 12/28/2022     Last metabolic panel Lab Results  Component Value Date   NA 122 (L) 12/30/2022   K 3.2 (L) 12/30/2022   CL 106 12/30/2022   CO2 16 (L) 12/30/2022   BUN 15 12/30/2022   CREATININE 0.93 12/30/2022   GLUCOSE 98 12/30/2022   GFRNONAA >60 12/30/2022   GFRAA >90 06/25/2011   CALCIUM 6.7 (L) 12/30/2022   PROT 6.5 12/29/2022   ALBUMIN 3.9 12/29/2022   BILITOT 0.9 12/29/2022   ALKPHOS 55  12/29/2022   AST 20 12/29/2022   ALT 19 12/29/2022   ANIONGAP 5 12/30/2022    CBG (last 3)  Recent Labs    12/30/22 0000 12/30/22 0752 12/30/22 1142  GLUCAP 133* 99 167*      Coagulation Profile: No results for input(s): "INR", "PROTIME" in the last 168 hours.   Radiology Studies: I have personally reviewed the imaging studies  CT Head  Wo Contrast  Result Date: 12/28/2022 CLINICAL DATA:  Head trauma, minor (Age >= 65y), altered mental status EXAM: CT HEAD WITHOUT CONTRAST TECHNIQUE: Contiguous axial images were obtained from the base of the skull through the vertex without intravenous contrast. RADIATION DOSE REDUCTION: This exam was performed according to the departmental dose-optimization program which includes automated exposure control, adjustment of the mA and/or kV according to patient size and/or use of iterative reconstruction technique. COMPARISON:  12/03/2022 FINDINGS: Brain: Normal anatomic configuration. Parenchymal volume loss is commensurate with the patient's age. Mild periventricular white matter changes are present likely reflecting the sequela of small vessel ischemia. Remote left cerebellar infarct again noted. No abnormal intra or extra-axial mass lesion or fluid collection. No abnormal mass effect or midline shift. No evidence of acute intracranial hemorrhage or infarct. Ventricular size is normal. Cerebellum unremarkable. Vascular: No asymmetric hyperdense vasculature at the skull base. Skull: Intact Sinuses/Orbits: Paranasal sinuses are clear. Orbits are unremarkable. Other: Mastoid air cells and middle ear cavities are clear. IMPRESSION: 1. No acute intracranial abnormality. No calvarial fracture. 2. Stable senescent changes. 3. Remote left cerebellar infarct. Electronically Signed   By: Helyn Numbers M.D.   On: 12/28/2022 21:09   CT Thoracic Spine Wo Contrast  Result Date: 12/28/2022 CLINICAL DATA:  Spine fracture, thoracic, traumatic EXAM: CT THORACIC SPINE WITHOUT  CONTRAST TECHNIQUE: Multidetector CT images of the thoracic were obtained using the standard protocol without intravenous contrast. RADIATION DOSE REDUCTION: This exam was performed according to the departmental dose-optimization program which includes automated exposure control, adjustment of the mA and/or kV according to patient size and/or use of iterative reconstruction technique. COMPARISON:  Thoracic spine CT 12/03/2022 FINDINGS: Alignment: Normal. Vertebrae: Diffuse flowing anterior osteophytes. Stable lucency through anterior osteophytes at T11 level from prior exam, series 14, image 31 and series 16, image 44. Fracture extends through the superior aspect of the T11 vertebral body, series 16, image 54. This is also unchanged. No acute fracture. Vertebral body heights are normal. Intact posterior elements. Paraspinal and other soft tissues: No paraspinal soft tissue thickening. No acute intrathoracic findings. Aortic atherosclerosis Disc levels: Diffuse anterior spurring and osteophytes. No spinal canal stenosis. IMPRESSION: 1. Unchanged fracture through anterior osteophytes at T11 and superior aspect of T11 vertebral body. No change in alignment from last month. 2. No acute fracture of the thoracic spine. Aortic Atherosclerosis (ICD10-I70.0). Electronically Signed   By: Narda Rutherford M.D.   On: 12/28/2022 20:45   CT Cervical Spine Wo Contrast  Result Date: 12/28/2022 CLINICAL DATA:  Fall in the bathroom, neck trauma. EXAM: CT CERVICAL SPINE WITHOUT CONTRAST TECHNIQUE: Multidetector CT imaging of the cervical spine was performed without intravenous contrast. Multiplanar CT image reconstructions were also generated. RADIATION DOSE REDUCTION: This exam was performed according to the departmental dose-optimization program which includes automated exposure control, adjustment of the mA and/or kV according to patient size and/or use of iterative reconstruction technique. COMPARISON:  CT cervical spine  12/03/2022 FINDINGS: Alignment: No traumatic malalignment. Chronic grade 1 anterolisthesis of C4 on C5. Skull base and vertebrae: No acute fracture. Vertebral body heights are maintained. The dens and skull base are intact. Soft tissues and spinal canal: No prevertebral fluid or swelling. No visible canal hematoma. Disc levels: Stable degree of multilevel degenerative disc disease and facet hypertrophy. Upper chest: No acute or unexpected findings. Other: None. IMPRESSION: 1. No acute fracture or traumatic malalignment of the cervical spine. 2. Stable degree of multilevel degenerative disc disease and facet hypertrophy. Electronically Signed  By: Narda Rutherford M.D.   On: 12/28/2022 20:40       Brean Carberry M.D. Dawn Bradley 12/30/2022, 2:03 PM  Available via Epic secure chat 7am-7pm After 7 pm, please refer to night coverage provider listed on amion.

## 2022-12-30 NOTE — Progress Notes (Signed)
[facility] Progress Note  Subjective: seen in room, still a bit confused. Na+ last night was up to 119 and this am was up to 127. This am the NS at 100 cc/hr was put on hold.  The f/u Na+ at 10 am is 122.    Vitals:   12/30/22 0400 12/30/22 0500 12/30/22 0800 12/30/22 1144  BP: (!) 139/94     Pulse: 68     Resp: 18     Temp: 97.7 F (36.5 C)  98.2 F (36.8 C) 98.6 F (37 C)  TempSrc: Oral  Oral Oral  SpO2: 100%     Weight:  87.3 kg    Height:        Exam: Gen alert, no distress, remains a bit confused No rash, cyanosis or gangrene Sclera anicteric, throat clear  No jvd or bruits Chest clear bilat to bases, no rales/ wheezing RRR no MRG Abd soft ntnd no mass or ascites +bs GU defer MS no joint effusions or deformity Ext no LE or UE edema, no wounds or ulcers Neuro is alert, Ox 3 , nf      Home meds include - mounjaro, valtrex, glipizide, norco, synthroid, lisnopril-hctz bid, robaxin, simvastatin, femara, zocor, ambien, prns/ vits/ supps     VS --> 130/ 80, HR 55-70, RR 16- 20, afeb, RA 100%      Today labs --> Na 117  K+ 4.2 CO2 18 Cl 85  BUN 23  creat 1.29, 1.41  alb 3.9    Serum osm 248   Hb 11  wbc 8K        UA 0-5 rbc/ 11-20 wbc /prot neg     Drug screen neg      Etoh < 10   Assessment/ Plan: Hyponatremia - w/ low UNa and hx of poor po intake for last 1-2 weeks related possibly to [facility] therapy. Pt also taking acei and hctz combination product. No SSRI's. VS are stable, no edema or signs of cirrhosis/ heart failure. 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 On exam pt looked dry . Suspect hyponatremia due to hypovolemia + acei + hctz effects. Na+ improved w/ NS 0.9% up to 119 yesterday evening. This am Na+ was 127 so 0.9% saline was stopped, but f/u Na+ 10 am is back down to 122.  Will bolus 500 cc and resume NS at 125 cc/hr and lower Na lab checks to q 6 hrs. Have d/w family. Will follow.  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 B/l creat 1.2- 1.5. Stable here.  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 Would avoid thiazides in the future in this patient severe hyponatremia.  Would hold acei for 1-2 months d/t hyponatremia.  DM2 - on po medications Recent TM11 fracture     Vinson Moselle MD [facility] 12/30/2022, 12:40 PM  Recent Labs  Lab 12/28/22 1845 12/28/22 2107 12/29/22 0355 12/29/22 0636 12/29/22 1625 12/30/22 0615  HGB  --    < > 11.2* 11.3*  --  9.0*  ALBUMIN 4.1  --  3.9  --   --   --   CALCIUM 9.1  --  8.8*  --  8.4* 6.7*  CREATININE 1.36*  --  1.29* 1.41* 1.19* 0.93  K 4.8  --  4.2  --  4.1 3.2*   < > = values in  this interval not displayed.   No results for input(s): "IRON", "TIBC", "FERRITIN" in the last 168 hours. Inpatient medications:  Chlorhexidine Gluconate Cloth  6 each Topical Daily   feeding supplement  237 mL Oral BID BM   heparin  5,000 Units Subcutaneous Q8H   insulin aspart  0-6 Units Subcutaneous TID WC   levothyroxine  88 mcg Oral Q0600   simvastatin  20 mg Oral QHS    sodium chloride     sodium chloride     acetaminophen, albuterol, hydrALAZINE, HYDROcodone-acetaminophen, ondansetron **OR** ondansetron (ZOFRAN) IV, mouth rinse

## 2022-12-30 NOTE — Evaluation (Addendum)
Physical Therapy Evaluation Patient Details Name: Dawn Bradley MRN: [id number] DOB: Dec 03, 1940 Today's Date: 12/30/2022  History of Present Illness  The patient is a 82 y.o. year-old w/ PMH as below who presented to ED after falling at home and hitting her head on the wall. Per husband pt had been having AMS for 1-2 wks and worsening. Lost her appetite as well, probably related to [facility] therapy. Hx of nephrectomy R, DM2, DCIS L breast sp lumpectomy. Recently in hospital after fall w/ T11 spinal fracture.  Clinical Impression  Pt admitted with above diagnosis.  Pt currently with functional limitations due to the deficits listed below (see PT Problem List). Pt will benefit from acute skilled PT to increase their independence and safety with mobility to allow discharge.  Pt did well from a mobility standpoint with RW, but noted confusion throughout session.  A home safety assessment maybe helpful due to having a flight of stairs.  Discussed home safety with husband including staying on the first floor initially and having someone with her on the stairs (and if possible, another family member instead of him). Patient's sodium low, but nursing okayed session as it was trending upwards.        Recommendations for follow up therapy are one component of a multi-disciplinary discharge planning process, led by the attending physician.  Recommendations may be updated based on patient status, additional functional criteria and insurance authorization.  Follow Up Recommendations Can patient physically be transported by private vehicle: Yes     Assistance Recommended at Discharge Intermittent Supervision/Assistance  Patient can return home with the following  A little help with walking and/or transfers;Help with stairs or ramp for entrance;A little help with bathing/dressing/bathroom;Direct supervision/assist for medications management;Assistance with cooking/housework;Assist for transportation     Equipment Recommendations None recommended by PT  Recommendations for Other Services       Functional Status Assessment Patient has had a recent decline in their functional status and demonstrates the ability to make significant improvements in function in a reasonable and predictable amount of time.     Precautions / Restrictions Precautions Precautions: Fall Precaution Comments: T11 fx in May 2024 Restrictions Weight Bearing Restrictions: No      Mobility  Bed Mobility               General bed mobility comments: up in recliner upon arrival    Transfers Overall transfer level: Modified independent   Transfers: Sit to/from Stand             General transfer comment: STS without AD    Ambulation/Gait Ambulation/Gait assistance: Min guard Gait Distance (Feet): 170 Feet Assistive device: Rolling walker (2 wheels) Gait Pattern/deviations: Step-through pattern       General Gait Details: steady with RW, although turned very quickly  Stairs            Wheelchair Mobility    Modified Rankin (Stroke Patients Only)       Balance Overall balance assessment: History of Falls         Standing balance support: During functional activity Standing balance-Leahy Scale: Fair                               Pertinent Vitals/Pain Pain Assessment Pain Assessment: No/denies pain    Home Living  Prior Function                       Hand Dominance        Extremity/Trunk Assessment   Upper Extremity Assessment Upper Extremity Assessment: Overall WFL for tasks assessed    Lower Extremity Assessment Lower Extremity Assessment: Overall WFL for tasks assessed    Cervical / Trunk Assessment Cervical / Trunk Assessment: Normal  Communication      Cognition Arousal/Alertness: Awake/alert   Overall Cognitive Status: Impaired/Different from baseline Area of Impairment: Orientation, Memory,  Safety/judgement                 Orientation Level: Disoriented to, Place, Time   Memory: Decreased short-term memory   Safety/Judgement: Decreased awareness of deficits     General Comments: Pleasant and can carry on a conversation, but not oriented and PLOF answers were mixed of being accurate and not accurate per husband Rosanne Ashing.        General Comments      Exercises Total Joint Exercises Ankle Circles/Pumps: AROM, Both, 5 reps, Seated Hip ABduction/ADduction: 5 reps, Both, Seated   Assessment/Plan    PT Assessment Patient needs continued PT services  PT Problem List Decreased mobility;Decreased cognition       PT Treatment Interventions Gait training;Functional mobility training;Therapeutic activities;Therapeutic exercise;Balance training    PT Goals (Current goals can be found in the Care Plan section)  Acute Rehab PT Goals Patient Stated Goal: To go home PT Goal Formulation: With patient/family Time For Goal Achievement: 01/13/23 Potential to Achieve Goals: Good    Frequency Min 1X/week     Co-evaluation               AM-PAC PT "6 Clicks" Mobility  Outcome Measure Help needed turning from your back to your side while in a flat bed without using bedrails?: A Little Help needed moving from lying on your back to sitting on the side of a flat bed without using bedrails?: A Little Help needed moving to and from a bed to a chair (including a wheelchair)?: A Little Help needed standing up from a chair using your arms (e.g., wheelchair or bedside chair)?: None Help needed to walk in hospital room?: A Little Help needed climbing 3-5 steps with a railing? : A Lot 6 Click Score: 18    End of Session Equipment Utilized During Treatment: Gait belt Activity Tolerance: Patient tolerated treatment well Patient left: in chair;with call bell/phone within reach;with nursing/sitter in room;with family/visitor present Psychiatrist and husband present. pt set up with  lunch) Nurse Communication: Mobility status PT Visit Diagnosis: Difficulty in walking, not elsewhere classified (R26.2)    Time: 4098-1191 PT Time Calculation (min) (ACUTE ONLY): 27 min   Charges:   PT Evaluation $PT Eval Moderate Complexity: 1 Mod PT Treatments $Gait Training: 8-22 mins        Colin Broach., PT Office [phone] Acute Rehab 12/30/2022   Enzo Montgomery 12/30/2022, 11:59 AM

## 2022-12-31 ENCOUNTER — Inpatient Hospital Stay (HOSPITAL_COMMUNITY): Payer: Medicare Other

## 2022-12-31 DIAGNOSIS — E871 Hypo-osmolality and hyponatremia: Secondary | ICD-10-CM | POA: Diagnosis not present

## 2022-12-31 LAB — SODIUM
Sodium: 126 mmol/L — ABNORMAL LOW (ref 135–145)
Sodium: 127 mmol/L — ABNORMAL LOW (ref 135–145)
Sodium: 131 mmol/L — ABNORMAL LOW (ref 135–145)

## 2022-12-31 LAB — BASIC METABOLIC PANEL
Anion gap: 8 (ref 5–15)
BUN: 13 mg/dL (ref 8–23)
CO2: 18 mmol/L — ABNORMAL LOW (ref 22–32)
Calcium: 8.8 mg/dL — ABNORMAL LOW (ref 8.9–10.3)
Chloride: 101 mmol/L (ref 98–111)
Creatinine, Ser: 1.11 mg/dL — ABNORMAL HIGH (ref 0.44–1.00)
GFR, Estimated: 49 mL/min — ABNORMAL LOW (ref >60)
Glucose, Bld: 110 mg/dL — ABNORMAL HIGH (ref 70–99)
Potassium: 4.9 mmol/L (ref 3.5–5.1)
Sodium: 127 mmol/L — ABNORMAL LOW (ref 135–145)

## 2022-12-31 LAB — SODIUM, URINE, RANDOM: Sodium, Ur: 77 mmol/L

## 2022-12-31 LAB — GLUCOSE, CAPILLARY
Glucose-Capillary: 104 mg/dL — ABNORMAL HIGH (ref 70–99)
Glucose-Capillary: 173 mg/dL — ABNORMAL HIGH (ref 70–99)
Glucose-Capillary: 112 mg/dL — ABNORMAL HIGH (ref 70–99)
Glucose-Capillary: 103 mg/dL — ABNORMAL HIGH (ref 70–99)

## 2022-12-31 LAB — OSMOLALITY, URINE: Osmolality, Ur: 275 mOsm/kg — ABNORMAL LOW (ref 300–900)

## 2022-12-31 LAB — T3: T3, Total: 65 ng/dL — ABNORMAL LOW (ref 71–180)

## 2022-12-31 MED ORDER — SODIUM BICARBONATE 650 MG PO TABS
650.0000 mg | ORAL_TABLET | Freq: Two times a day (BID) | ORAL | Status: DC
  Administered 2022-12-31 – 2023-01-01 (×3): 650 mg via ORAL
  Filled 2022-12-31 (×3): qty 1

## 2022-12-31 MED ORDER — TOLVAPTAN 15 MG PO TABS
15.0000 mg | ORAL_TABLET | Freq: Once | ORAL | Status: AC
  Administered 2022-12-31: 15 mg via ORAL
  Filled 2022-12-31: qty 1

## 2022-12-31 MED ORDER — ZOLPIDEM TARTRATE 5 MG PO TABS
5.0000 mg | ORAL_TABLET | Freq: Every evening | ORAL | Status: DC | PRN
  Administered 2022-12-31 – 2023-01-01 (×2): 5 mg via ORAL
  Filled 2022-12-31 (×2): qty 1

## 2022-12-31 MED ORDER — SODIUM BICARBONATE 650 MG PO TABS
1300.0000 mg | ORAL_TABLET | Freq: Two times a day (BID) | ORAL | Status: DC
  Administered 2022-12-31: 1300 mg via ORAL
  Filled 2022-12-31: qty 2

## 2022-12-31 NOTE — Progress Notes (Signed)
PROGRESS NOTE  Dawn Bradley  DOB: February 19, 1941  PCP: Geoffry Paradise, MD WUJ:[id number]  DOA: 12/28/2022  LOS: 3 days  Hospital Day: 4  Brief narrative: Dawn Bradley is a 82 y.o. 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   Of note, patient had chronic diarrhea for several months which her PCP attributed to metformin and stopped it and switch her to weekly Mounjaro shots.  Her diarrhea has stopped but after 2 shots, patient started having poor appetite, constipation and generalized weakness leading to a fall on 5/15.  She sustained T11 spinal fracture that was treated with pain management and LSO brace and discharged to [facility] on 5/19. Patient and family did not like the facility and left the home after 4 days.  Husband states she continued to be weak at home.  Patient followed-up with PCP.  Greggory Keen was stopped but he continued to have symptoms 6/9, patient was brought to the ED after an episode of fall at home.  In the ED, patient was afebrile, hemodynamically stable Labs showed sodium level significantly low at 110, serum osmolality low at 251, creatinine at 1.36 Started on IV fluid Admitted to [facility] Nephrology consulted.  Subjective: Patient was seen and examined this morning.  Pleasant elderly Caucasian female.  Sitting up in recliner.  Not in physical distress.  Able to have conversation but unable to gather details from the past.  She seems to be confused with the details, husband at bedside correcting and reorienting her. Chart reviewed. Remains hemodynamically stable Last set of labs from this morning with sodium low but improving at 127  Assessment and plan: Acute hypoosmolar hyponatremia Presented with low sodium level of 110 in the setting of poor oral intake possibly related to [facility], was also on ACEI/HCTZ  On  exam, she was dry.  Urine sodium level was low.  Serum osmolality and urine osmolality were both low. History and labs suggestive of hypovolemic hypoosmolar hyponatremia per nephrology Currently on IV hydration and serial monitoring of sodium level.  Trend as below Discussed with nephrologist Dr. Arlean Hopping this morning.  Will obtain CT chest to rule out lung cancer. 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  Dehydrated on presentation but creatinine remains at baseline.  AKI ruled out Sodium bicarb 31 mg twice daily started this morning.  Recent Labs    12/04/22 0348 12/05/22 0341 12/06/22 0329 12/07/22 0307 12/08/22 0329 12/28/22 1845 12/29/22 0355 12/29/22 0636 12/29/22 1625 12/30/22 0615 12/31/22 0620  BUN 36* 24* 18 15 18  25* 23  --  19 15 13   CREATININE 1.48* 1.41* 1.28* 1.15* 1.31* 1.36* 1.29* 1.41* 1.19* 0.93 1.11*  CO2 19* 20* 22 23 24  16* 18*  --  17* 16* 18*   Acute metabolic encephalopathy Reported progressive confusion for few weeks.   Likely due to significantly low sodium level CT head did not show anyn acute intracranial abnormality, remote left cerebellar infarct UA negative for UTI Patient is still remains quite confused despite sodium correction.   Pending MRI brain today    Hypothyroidism TSH  0.3 PTA on Synthroid 100 mcg daily.  Currently reduced to 88 mcg daily   H/o DCIS left breast S/p lumpectomy  Continue utpatient follow-up with [facility]    Essential hypertension PTA on HCTZ/ACEI Per nephrology, to avoid thiazide in the future because of severe hyponatremia.  To also hold ACEI for 1 to 2 months for the same reason  Type 2 diabetes  mellitus A1c 6.2 on 12/29/2022 PTA on glipizide 5 mg twice daily.  I would avoid both metformin and Mounjaro at this time Currently on SSI/Accu-Cheks Recent Labs  Lab 12/30/22 1142 12/30/22 1639 12/30/22 2107 12/31/22 0815 12/31/22 1123  GLUCAP 167* 100* 117* 104* 173*   Hyperlipidemia Continue simvastatin  Morbid Obesity  Body mass index is 34.4 kg/m. Patient has been advised to make an attempt to improve diet and exercise patterns to aid in weight loss.  Generalized weakness Lumbar spinal stenosis Frequent falls, recent T11 fracture (previous admission) Neurosurgery last admission recommended conservative management, follow-up outpatient She was placed on TLSO brace, pain control  Currently on pain management with Tylenol and Vicodin as needed PT eval obtained.  Home with PT recommended  Goals of care   Code Status: Full Code     DVT prophylaxis:  heparin injection 5,000 Units Start: 12/29/22 0600   Antimicrobials: None Fluid: Defer to nephrology for fluid choice Consultants: Nephrology Family Communication: Husband at bedside  Status: Inpatient Level of care:  Stepdown   Patient from: Home Anticipated d/c to: Home health PT  Needs to continue in-hospital care:  Sodium level improving but still low.  Workup in progress   Diet:  Diet Order             Diet regular Room service appropriate? Yes; Fluid consistency: Thin  Diet effective now                   Scheduled Meds:  Chlorhexidine Gluconate Cloth  6 each Topical Daily   feeding supplement  237 mL Oral BID BM   heparin  5,000 Units Subcutaneous Q8H   insulin aspart  0-6 Units Subcutaneous TID WC   levothyroxine  88 mcg Oral Q0600   simvastatin  20 mg Oral QHS   sodium bicarbonate  650 mg Oral BID    PRN meds: acetaminophen, albuterol, hydrALAZINE, HYDROcodone-acetaminophen, ondansetron **OR** ondansetron (ZOFRAN) IV, mouth rinse   Infusions:     Antimicrobials: Anti-infectives (From  admission, onward)    None       Nutritional status:  Body mass index is 34.4 kg/m.          Objective: Vitals:   12/31/22 0800 12/31/22 1125  BP: (!) 159/61   Pulse:    Resp: (!) 23   Temp: 98.9 F (37.2 C) 97.6 F (36.4 C)  SpO2: 100%     Intake/Output Summary (Last 24 hours) at 12/31/2022 1407 Last data filed at 12/31/2022 1106 Gross per 24 hour  Intake 2068.36 ml  Output 200 ml  Net 1868.36 ml   Filed Weights   12/29/22 1200 12/30/22 0500 12/31/22 0500  Weight: 74.9 kg 87.3 kg 90.9 kg   Weight change: 16 kg Body mass index is 34.4 kg/m.   Physical Exam: General exam: Pleasant elderly Caucasian female.  Not in physical distress Skin: No rashes, lesions or ulcers. HEENT: Atraumatic, normocephalic, no obvious bleeding Lungs: Clear to auscultation bilaterally CVS: Regular rate and rhythm, no murmur GI/Abd: soft, nontender, nondistended, bowel sound present CNS: Alert, awake, slow to respond, oriented to place  and person.  Confusion some details of the history Psychiatry: Mood appropriate Extremities: No pedal edema, no calf tenderness  Data Review: I have personally reviewed the laboratory data and studies available.  F/u labs ordered Unresulted Labs (From admission, onward)     Start     Ordered   12/31/22 0841  Osmolality, urine  Once,   R        12/31/22 0840   12/30/22 1732  Sodium  Now then every 6 hours,   R (with TIMED occurrences)     Question:  Specimen collection method  Answer:  Lab=Lab collect   12/30/22 1251   12/30/22 0500  Basic metabolic panel  Daily,   R      12/29/22 0559            Total time spent in review of labs and imaging, patient evaluation, formulation of plan, documentation and communication with family: 55 minutes  Signed, Lorin Glass, MD [facility] 12/31/2022

## 2022-12-31 NOTE — TOC Initial Note (Addendum)
Transition of Care [facility]) - Initial/Assessment Note    Patient Details  Name: Dawn Bradley MRN: [id number] Date of Birth: 1941/04/01  [facility] CM/SW Contact:    Howell Rucks, RN Phone Number: 12/31/2022, 1:19 PM  Clinical Narrative:  PT eval completed with recommendation for [facility] PT, per documentation pt with hx of confusion. NCM called to pt's spouse [name]), no answer, vm left with NCM name and phone number requesting call back. TOC will continue to follow.       -2:31pm Call received from pt's spouseFayrene Fearing), NCM introduced role of TOC/NCM and reviewed for dc needs, PT recommendation for [facility] PT, spouse agreeable, states she thinks pt already on service with [facility]. NCM will confirm. Spouse reports pt has home DME: walker x 2, shower chair, raised potty chair, transport will be provided by spouse at time of dc. Spouse had not further questions/concerns. TOC will continue to follow.     -3:36pm [facility] rep-Cheryl, confirmed pt on service receiving HH PT, will accept for continuation of services.                  Patient Goals and CMS Choice            Expected Discharge Plan and Services                                              Prior Living Arrangements/Services                       Activities of Daily Living Home Assistive Devices/Equipment: Blood pressure cuff, Cane (specify quad or straight), Walker (specify type), Eyeglasses ADL Screening (condition at time of admission) Patient's cognitive ability adequate to safely complete daily activities?: Yes Is the patient deaf or have difficulty hearing?: No Does the patient have difficulty seeing, even when wearing glasses/contacts?: No Does the patient have difficulty concentrating, remembering, or making decisions?: No Patient able to express need for assistance with ADLs?: Yes Does the patient have difficulty dressing or bathing?: No Independently performs ADLs?: Yes  (appropriate for developmental age) Communication: Independent Grooming: Independent Feeding: Independent Bathing: Independent Toileting: Independent In/Out Bed: Independent Walks in Home: Independent Does the patient have difficulty walking or climbing stairs?: No Weakness of Legs: None Weakness of Arms/Hands: None  Permission Sought/Granted                  Emotional Assessment              Admission diagnosis:  Hyponatremia [E87.1] Patient Active Problem List   Diagnosis Date Noted   Generalized weakness 12/29/2022   Acute metabolic encephalopathy 12/29/2022   Falls 12/29/2022   Chronic kidney disease, stage III (moderate) (HCC) 12/29/2022   Essential hypertension 12/29/2022   Hyperlipidemia 12/29/2022   Obesity 12/29/2022   Closed left hip fracture (HCC) 12/04/2022   Thoracic spine fracture (HCC) 12/03/2022   Hyponatremia 12/03/2022   Hyperkalemia 12/03/2022   AKI (acute kidney injury) (HCC) 12/03/2022   Hypothyroidism 12/03/2022   Obesity (BMI 30-39.9) 12/03/2022   History of carcinoma 12/03/2022   Leukocytosis 12/03/2022   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 [place] [address] Phone: [phone] Fax: [phone]     Social Determinants of Health (SDOH) Social History: SDOH Screenings   Food Insecurity: No Food Insecurity (12/29/2022)  Housing: Low Risk  (12/29/2022)  Transportation Needs: No Transportation Needs (12/29/2022)  Utilities: Not At Risk (12/29/2022)  Tobacco Use: Medium Risk (12/29/2022)   SDOH Interventions:     Readmission Risk Interventions    12/31/2022    1:19 PM  Readmission Risk Prevention Plan  Transportation Screening Complete  PCP or  Specialist Appt within 5-7 Days Complete  Home Care Screening Complete  Medication Review (RN CM) Complete

## 2022-12-31 NOTE — Progress Notes (Addendum)
[facility] Progress Note  Subjective: seen in room, remains a bit confused. Na+ stuck in the 125- 127 range it appears. Repeat UNa is up to 77.   Vitals:   12/31/22 0337 12/31/22 0500 12/31/22 0800 12/31/22 1125  BP: (!) 157/43  (!) 159/61   Pulse: 65     Resp: 10  (!) 23   Temp: 97.6 F (36.4 C)  98.9 F (37.2 C) 97.6 F (36.4 C)  TempSrc: Oral  Oral Oral  SpO2: 100%  100%   Weight:  90.9 kg    Height:        Exam: Gen alert, no distress, remains a bit confused No jvd or bruits Chest clear bilat to bases, no rales/ wheezing RRR no MRG Abd soft ntnd no mass or ascites +bs MS no joint effusions or deformity Ext no LE or UE edema, no wounds or ulcers Neuro is alert, Ox 3 , nf      Home meds include - mounjaro, valtrex, glipizide, norco, synthroid, lisnopril-hctz bid, robaxin, simvastatin, femara, zocor, ambien, prns/ vits/ supps       6/09 - UNa 29, UOsm 225     6/12 - UNa 77,  UOsm pend    VS today --> BP 130- 159 / 45- 65 , HR 55- 80, RR 14- 22, afeb        Assessment/ Plan: Hyponatremia - serum Na 110 w/ low UNa 29 and hx of poor po intake for 1-2 weeks related possibly to [facility] therapy. Pt was also taking acei and hctz combination product. No SSRI's. VS were stable, no edema or signs of cirrhosis/ heart failure. 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 On exam pt looked dry. Hypovolemic hyponatremia was suspected due to poor po intake, + additional possible contributions from acei + hctz effects. 0.9% saline was given over the next 48 hrs the Na+ gradually improved up to 125- 127 range today. Appears stuck now - suspect the volume issue has resolved and there is a 2nd underlying problem w/ low solute or SIADH. Repeat UNa is 77 and UOsm is pending. Will plan for tolvaptan 15 mg x 1, f/u labs in am. Have d/w pmd, agree w/ body CT to r/o malignancy. Will follow.  AMS/ falls - remains confused, MRI brain pending.  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 B/l creat 1.2- 1.5. Stable here.  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 Would avoid thiazides in the future in this patient w/ severe hyponatremia.  Would hold acei for 1-2 months then okay to resume.  DM2 - on po medications Recent TM11 fracture     Dawn Moselle MD [facility] 12/31/2022, 1:58 PM  Recent Labs  Lab 12/28/22 1845 12/28/22 2107 12/29/22 0355 12/29/22 0636 12/29/22 1625 12/30/22 0615 12/31/22 0620  HGB  --    < > 11.2* 11.3*  --  9.0*  --   ALBUMIN 4.1  --  3.9  --   --   --   --   CALCIUM 9.1  --  8.8*  --    < > 6.7* 8.8*  CREATININE 1.36*  --  1.29* 1.41*   < > 0.93 1.11*  K 4.8  --  4.2  --    < > 3.2* 4.9   < > = values in this interval not displayed.    No results for input(s): "IRON", "  TIBC", "FERRITIN" in the last 168 hours. Inpatient medications:  Chlorhexidine Gluconate Cloth  6 each Topical Daily   feeding supplement  237 mL Oral BID BM   heparin  5,000 Units Subcutaneous Q8H   insulin aspart  0-6 Units Subcutaneous TID WC   levothyroxine  88 mcg Oral Q0600   simvastatin  20 mg Oral QHS   sodium bicarbonate  650 mg Oral BID     acetaminophen, albuterol, hydrALAZINE, HYDROcodone-acetaminophen, ondansetron **OR** ondansetron (ZOFRAN) IV, mouth rinse

## 2022-12-31 NOTE — Progress Notes (Signed)
Mobility Specialist - Progress Note   12/31/22 0914  Mobility  Activity Ambulated independently in hallway;Ambulated with assistance in hallway  Level of Assistance Independent  Assistive Device None  Distance Ambulated (ft) 350 ft  Activity Response Tolerated well  Mobility Referral Yes  $Mobility charge 1 Mobility  Mobility Specialist Start Time (ACUTE ONLY) [phone]  Mobility Specialist Stop Time (ACUTE ONLY) 0913  Mobility Specialist Time Calculation (min) (ACUTE ONLY) 22 min   Pt received EOB w/ sitter in room and agreeable to mobility. Assisted pt w/ HHA during the first ~125 of ambulation. During session, pt took a seated rest break (~41min). No complaints during session. Pt to EOB after session with all needs met & sitter in room.   [facility]

## 2023-01-01 ENCOUNTER — Inpatient Hospital Stay (HOSPITAL_COMMUNITY): Payer: Medicare Other

## 2023-01-01 ENCOUNTER — Encounter (HOSPITAL_COMMUNITY): Payer: Self-pay | Admitting: Internal Medicine

## 2023-01-01 DIAGNOSIS — E871 Hypo-osmolality and hyponatremia: Secondary | ICD-10-CM | POA: Diagnosis not present

## 2023-01-01 LAB — GLUCOSE, CAPILLARY
Glucose-Capillary: 140 mg/dL — ABNORMAL HIGH (ref 70–99)
Glucose-Capillary: 130 mg/dL — ABNORMAL HIGH (ref 70–99)
Glucose-Capillary: 149 mg/dL — ABNORMAL HIGH (ref 70–99)
Glucose-Capillary: 122 mg/dL — ABNORMAL HIGH (ref 70–99)

## 2023-01-01 LAB — BASIC METABOLIC PANEL
Anion gap: 7 (ref 5–15)
BUN: 9 mg/dL (ref 8–23)
CO2: 19 mmol/L — ABNORMAL LOW (ref 22–32)
Calcium: 8.8 mg/dL — ABNORMAL LOW (ref 8.9–10.3)
Chloride: 104 mmol/L (ref 98–111)
Creatinine, Ser: 1.14 mg/dL — ABNORMAL HIGH (ref 0.44–1.00)
GFR, Estimated: 48 mL/min — ABNORMAL LOW (ref >60)
Glucose, Bld: 110 mg/dL — ABNORMAL HIGH (ref 70–99)
Potassium: 4.2 mmol/L (ref 3.5–5.1)
Sodium: 130 mmol/L — ABNORMAL LOW (ref 135–145)

## 2023-01-01 MED ORDER — SODIUM CHLORIDE 1 G PO TABS: 1.0000 g | ORAL_TABLET | Freq: Three times a day (TID) | ORAL | Status: DC

## 2023-01-01 MED ORDER — BOOST / RESOURCE BREEZE PO LIQD CUSTOM: 1.0000 | Freq: Two times a day (BID) | ORAL | Status: DC

## 2023-01-01 MED ORDER — SODIUM CHLORIDE 1 G PO TABS
1.0000 g | ORAL_TABLET | Freq: Three times a day (TID) | ORAL | Status: DC
  Administered 2023-01-01 – 2023-01-02 (×2): 1 g via ORAL
  Filled 2023-01-01 (×3): qty 1

## 2023-01-01 MED ORDER — ADULT MULTIVITAMIN W/MINERALS CH
1.0000 | ORAL_TABLET | Freq: Every day | ORAL | Status: DC
  Administered 2023-01-02: 1 via ORAL
  Filled 2023-01-01: qty 1

## 2023-01-01 NOTE — Progress Notes (Signed)
Physical Therapy Treatment Patient Details Name: Dawn Bradley MRN: [id number] DOB: 20-Feb-1941 Today's Date: 01/01/2023   History of Present Illness The patient is a 82 y.o. year-old w/ PMH as below who presented to ED after falling at home and hitting her head on the wall. Per husband pt had been having AMS for 1-2 wks and worsening. Lost her appetite as well, probably related to [facility] therapy. Hx of nephrectomy R, DM2, DCIS L breast sp lumpectomy. Recently in hospital after fall w/ T11 spinal fracture.    PT Comments    Pt alert, oriented, cooperative and mobilizing at sup to MOD I level of assist including up to hall to ambulate full lap including stepping fwd and bkwd with no overt LOB.  Pt encouraged by progress and eager for return home with family.   Recommendations for follow up therapy are one component of a multi-disciplinary discharge planning process, led by the attending physician.  Recommendations may be updated based on patient status, additional functional criteria and insurance authorization.  Follow Up Recommendations  Can patient physically be transported by private vehicle: Yes    Assistance Recommended at Discharge Intermittent Supervision/Assistance  Patient can return home with the following A little help with walking and/or transfers;Help with stairs or ramp for entrance;A little help with bathing/dressing/bathroom;Direct supervision/assist for medications management;Assistance with cooking/housework;Assist for transportation   Equipment Recommendations  None recommended by PT    Recommendations for Other Services       Precautions / Restrictions Precautions Precautions: Fall Precaution Comments: T11 fx in May 2024 Restrictions Weight Bearing Restrictions: No     Mobility  Bed Mobility               General bed mobility comments: up in recliner upon arrival and requests back to same    Transfers   Equipment used: None Transfers: Sit  to/from Stand Sit to Stand: Supervision           General transfer comment: STS without AD    Ambulation/Gait Ambulation/Gait assistance: Min guard, Supervision Gait Distance (Feet): 350 Feet Assistive device: None Gait Pattern/deviations: Step-through pattern Gait velocity: decr     General Gait Details: Steps fwd, bkwd and sideways with good stability and good safety awarenessq   Stairs             Wheelchair Mobility    Modified Rankin (Stroke Patients Only)       Balance Overall balance assessment: History of Falls Sitting-balance support: Feet supported, No upper extremity supported Sitting balance-Leahy Scale: Good     Standing balance support: During functional activity Standing balance-Leahy Scale: Good                              Cognition Arousal/Alertness: Awake/alert Behavior During Therapy: WFL for tasks assessed/performed                                   General Comments: A+O x 4        Exercises      General Comments        Pertinent Vitals/Pain Pain Assessment Pain Assessment: No/denies pain    Home Living                          Prior Function  PT Goals (current goals can now be found in the care plan section) Acute Rehab PT Goals Patient Stated Goal: To go home PT Goal Formulation: With patient/family Time For Goal Achievement: 01/13/23 Potential to Achieve Goals: Good Progress towards PT goals: Progressing toward goals    Frequency    Min 1X/week      PT Plan Current plan remains appropriate    Co-evaluation              AM-PAC PT "6 Clicks" Mobility   Outcome Measure  Help needed turning from your back to your side while in a flat bed without using bedrails?: A Little Help needed moving from lying on your back to sitting on the side of a flat bed without using bedrails?: A Little Help needed moving to and from a bed to a chair (including a  wheelchair)?: A Little Help needed standing up from a chair using your arms (e.g., wheelchair or bedside chair)?: None Help needed to walk in hospital room?: A Little Help needed climbing 3-5 steps with a railing? : A Little 6 Click Score: 19    End of Session Equipment Utilized During Treatment: Gait belt Activity Tolerance: Patient tolerated treatment well Patient left: in chair;with call bell/phone within reach;with nursing/sitter in room;with family/visitor present Nurse Communication: Mobility status PT Visit Diagnosis: Difficulty in walking, not elsewhere classified (R26.2) Pain - Right/Left: Right     Time: 1339-1350 PT Time Calculation (min) (ACUTE ONLY): 11 min  Charges:  $Gait Training: 8-22 mins                     Mauro Kaufmann PT Acute Rehabilitation Services Pager [phone] Office [phone]    Laurel Smeltz 01/01/2023, 2:00 PM

## 2023-01-01 NOTE — Progress Notes (Signed)
Initial Nutrition Assessment  DOCUMENTATION CODES:   Not applicable  INTERVENTION:  - Regular diet, fluid restriction per MD.  - Boost Breeze po TID, each supplement provides 250 kcal and 9 grams of protein - Encourage intake at all meals as tolerated. - Multivitamin with minerals daily - Monitor weight trends.   NUTRITION DIAGNOSIS:   Increased nutrient needs related to acute illness as evidenced by estimated needs.  GOAL:   Patient will meet greater than or equal to 90% of their needs  MONITOR:   PO intake, Supplement acceptance, Weight trends, Labs, I & O's  REASON FOR ASSESSMENT:   Malnutrition Screening Tool    ASSESSMENT:   82 y.o. female with PMH significant of right upper urothelial carcinoma, diabetes mellitus type 2, DCIS of the left breast, presented after fall, admitted with hyponatremia.  Of note, patient had chronic diarrhea for several months which was attributed to metformin so she was switched to weekly Mounjaro shots. Diarrhea stopped but after 2 shots, patient started having poor appetite, constipation and generalized weakness leading to a fall on 5/15 with resulting T11 spinal fracture  Patient reports UBW of 180# and feels she has been losing weight since April (after being started on Monjaro shots).  Most recent weight PTA is from 5/15, when patient was weighed at 199#. Weights this admission have varied widely: Date/Time Weight Weight in lbs  01/01/23 0500 88.9 kg 195.99 lbs  12/31/22 0500 90.9 kg 200.4 lbs  12/30/22 0500 87.3 kg 192.46 lbs  12/29/22 1200 74.9 kg 165.12 lbs  12/28/22 1757 78 kg 172 lbs  Current weight status and recent changes difficult to assess at this time.   Patient notes she has been trying to eat 3 meals a day at home but only able to take a few bites at each meal. Decreased appetite and a decreased sense of taste have hindered intake. She has tried nutrition supplements but notes they often cause her to be  constipated.  Current appetite remains about the same. Documented to be consuming 20-100% of meals since admission. Notes she doesn't like the available hospital food and that it all tastes bad. Informed patient she is able to have foods brought in for her. Also discussed strategies for dealing with taste changes.  Thankfully, patient notes she was given a [employer] yesterday and really enjoyed it. Agreeable to continue receiving this admission. Per patient's request, ordered her desired dinner, breakfast and lunch meals for the next day.    Medications reviewed and include: Insulin  Labs reviewed:  Na 130 Creatinine 1.14 HA1C 6.2   NUTRITION - FOCUSED PHYSICAL EXAM:  Flowsheet Row Most Recent Value  Orbital Region No depletion  Upper Arm Region No depletion  Thoracic and Lumbar Region No depletion  Buccal Region No depletion  Temple Region Mild depletion  Clavicle Bone Region No depletion  Clavicle and Acromion Bone Region No depletion  Scapular Bone Region Unable to assess  Dorsal Hand No depletion  Patellar Region No depletion  Anterior Thigh Region No depletion  Posterior Calf Region No depletion  Edema (RD Assessment) None  Hair Reviewed  Eyes Reviewed  Mouth Reviewed  Skin Reviewed  Nails Reviewed       Diet Order:   Diet Order             Diet regular Room service appropriate? Yes; Fluid consistency: Thin; Fluid restriction: 1200 mL Fluid  Diet effective now  EDUCATION NEEDS:  Education needs have been addressed  Skin:  Skin Assessment: Reviewed RN Assessment  Last BM:  6/11  Height:  Ht Readings from Last 1 Encounters:  12/29/22 5\' 4"  (1.626 m)   Weight:  Wt Readings from Last 1 Encounters:  01/01/23 88.9 kg    BMI:  Body mass index is 33.64 kg/m.  Estimated Nutritional Needs:  Kcal:  1600-1850 kcals Protein:  65-80 grams Fluid:  >/= 1.6L    Shelle Iron RD, LDN For contact information, refer to [facility].

## 2023-01-01 NOTE — Progress Notes (Signed)
[facility] Progress Note  Subjective: seen in room, MS is better overall, but not baseline yet I believe. Na+ 130-131 today after tolvaptan yest.   Vitals:   01/01/23 1030 01/01/23 1100 01/01/23 1130 01/01/23 1132  BP:      Pulse: 60 71    Resp: (!) 24 15 19    Temp:    98 F (36.7 C)  TempSrc:    Oral  SpO2: 96% 97%  97%  Weight:      Height:        Exam: Gen alert, no distress No jvd or bruits Chest clear bilat to bases RRR no MRG Abd soft ntnd no mass or ascites +bs MS no joint effusions or deformity Ext no LE edema Neuro is alert, Ox 3 , nf      Home meds include - mounjaro, valtrex, glipizide, norco, synthroid, lisnopril-hctz bid, robaxin, simvastatin, femara, zocor, ambien, prns/ vits/ supps       6/09 - UNa 29, UOsm 225     6/12 - UNa 77,  UOsm pend    VS today --> BP 130- 159 / 45- 65 , HR 55- 80, RR 14- 22, afeb      6/10 - TSH 0.300    Assessment/ Plan: Hyponatremia - serum Na 110 w/ low UNa 29 and hx of poor po intake for 1-2 weeks related possibly to [facility] therapy. Pt was also taking acei and hctz combination product. No SSRI's. VS were stable, no edema or signs of cirrhosis/ heart failure. 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 On exam pt looked dry. Hyponatremia was treated initially as hypovolemia due to poor po intake + acei and hctz effects. 0.9% saline was given over the next 48 hrs and the Na+ gradually improved from 110 up to 125- 127 yesterday but improvement stopped there. Repeat urine lytes were more w/ SIADH picture. We gave tolvaptan 15 mg x 1 and Na+ improved to 130 today. Chest CT didn't  show any mass/ malignancy. Her hyponatremia was cause mostly by volume depletion and also a 2nd component of SIADH picture as above. Vol depletion resolved w/ fluids. SIADH picture could be due to her chronic pain, vs meds (hctz, now dc'd) or other, and improved w/ tolvaptan x 1. At this point would  --> Continue SIADH treatment w/ NaCl tabs 1 gm tid for 10 days  And use fluid restriction at 1200 cc/d Avoid all thiazides Avoid acei for 1-2 mos then may resume Needs close PCP f/u in the outpatient setting. If needs longer term salt tabs to keep Na+ up, PCP can prescribe these in the range of 1 gm bid up to 3 gm tid (2- 9gm total/ day) SIADH picture may be transient so she may not need these treatments long-term No other suggestions. Will sign off.   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 B/l creat 1.2- 1.5. Stable here.  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 Would avoid thiazides in the future in this patient w/ severe hyponatremia.  Would hold acei for 1-2 months then okay to resume.  DM2 - on po medications Recent TM11 fracture     Vinson Moselle MD [facility] 01/01/2023, 1:59 PM  Recent Labs  Lab 12/28/22 1845 12/28/22 2107 12/29/22 0355 12/29/22 0636 12/29/22 1625 12/30/22 0615 12/31/22 0620 01/01/23 0315  HGB  --    < > 11.2* 11.3*  --  9.0*  --   --   ALBUMIN 4.1  --  3.9  --   --   --   --   --   CALCIUM 9.1  --  8.8*  --    < > 6.7* 8.8* 8.8*  CREATININE 1.36*  --  1.29* 1.41*   < > 0.93 1.11* 1.14*  K 4.8  --  4.2  --    < > 3.2* 4.9 4.2   < > = values in this interval not displayed.    No results for input(s): "IRON", "TIBC", "FERRITIN" in the last 168 hours. Inpatient medications:  Chlorhexidine Gluconate Cloth  6 each Topical Daily   feeding supplement  237 mL Oral BID BM   heparin  5,000 Units Subcutaneous Q8H   insulin aspart  0-6 Units Subcutaneous TID WC   levothyroxine  88 mcg Oral Q0600   simvastatin  20 mg Oral QHS   sodium bicarbonate  650 mg Oral BID   sodium chloride  1 g Oral TID WC     acetaminophen, albuterol, hydrALAZINE, HYDROcodone-acetaminophen, ondansetron **OR** ondansetron (ZOFRAN) IV, mouth rinse, zolpidem

## 2023-01-01 NOTE — Progress Notes (Signed)
PROGRESS NOTE  Dawn Bradley  DOB: August 15, 1940  PCP: Geoffry Paradise, MD GGY:[id number]  DOA: 12/28/2022  LOS: 4 days  Hospital Day: 5  Brief narrative: Dawn Bradley is a 82 y.o. 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   Of note, patient had chronic diarrhea for several months which her PCP attributed to metformin and stopped it and switch her to weekly Mounjaro shots.  Her diarrhea has stopped but after 2 shots, patient started having poor appetite, constipation and generalized weakness leading to a fall on 5/15.  She sustained T11 spinal fracture that was treated with pain management and LSO brace and discharged to [facility] on 5/19. Patient and family did not like the facility and left the home after 4 days.  Husband states she continued to be weak at home.  Patient followed-up with PCP.  Greggory Keen was stopped but he continued to have symptoms 6/9, patient was brought to the ED after an episode of fall at home.  In the ED, patient was afebrile, hemodynamically stable Labs showed sodium level significantly low at 110, serum osmolality low at 251, creatinine at 1.36 Started on IV fluid Admitted to [facility] Nephrology consulted.  Subjective: Patient was seen and examined this morning.  Pleasant elderly Caucasian female.  Sitting up in recliner.  Not in distress.  Husband not at bedside today.  Patient is able to answer questions more fluently and currently today  than yesterday.   Sodium level 130 today.  Assessment and plan: Acute hypoosmolar hyponatremia Presented with low sodium level of 110 in the setting of poor oral intake possibly related to [facility], was also on ACEI/HCTZ  On exam, she was dry.  Urine sodium level was low.  Serum osmolality and urine osmolality were both low. History and labs suggestive of hypovolemic  hypoosmolar hyponatremia per nephrology CT chest 6/12 did not show any evidence of lung cancer. Adequate IV hydration given.  Sodium level monitored.  Also started on sodium bicarb tablet. Nephrologist following.  Last sodium level 130 this morning. 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  Dehydrated on presentation but creatinine remains at baseline.  AKI ruled out Sodium bicarb 1300 mg twice daily  Recent Labs    12/05/22 0341 12/06/22 0329 12/07/22 0307 12/08/22 0329 12/28/22 1845 12/29/22 0355 12/29/22 0636 12/29/22 1625 12/30/22 0615 12/31/22 0620 01/01/23 0315  BUN 24* 18 15 18  25* 23  --  19 15 13 9   CREATININE 1.41* 1.28* 1.15* 1.31* 1.36* 1.29* 1.41* 1.19* 0.93 1.11* 1.14*  CO2 20* 22 23 24  16* 18*  --  17* 16* 18* 19*   Acute metabolic encephalopathy Reported progressive confusion for few weeks.   Likely due to significantly low sodium level CT head did not show anyn acute intracranial abnormality, remote left cerebellar infarct UA negative for UTI MRI brain 6/12 did not show any acute intracranial abnormality.  It showed mild chronic microvascular ischemic changes and moderate parenchymal volume loss. Mental status gradually improving.  Liver cirrhosis Noted in CT scan of chest on 6/12. Clinically no evidence  of volume overload. Obtain ammonia level. Obtain right upper quadrant ultrasound.  No results for input(s): "AMMONIA" in the last 168 hours.   Hypothyroidism TSH 0.3 PTA on Synthroid 100 mcg daily.  Currently reduced to 88 mcg daily   H/o DCIS left breast S/p lumpectomy  Continue [facility]patient follow-[facility] with [facility]    Essential  hypertension PTA on HCTZ/ACEI Per nephrology, to avoid thiazide in the future because of severe hyponatremia.  To also hold ACEI for 1 to 2 months for the same reason  Type 2 diabetes mellitus A1c 6.2 on 12/29/2022 PTA on glipizide 5 mg twice daily.  I would avoid both metformin and Mounjaro at this time Currently on SSI/Accu-Cheks Recent Labs  Lab 12/31/22 1123 12/31/22 1603 12/31/22 2100 01/01/23 0747 01/01/23 1128  GLUCAP 173* 112* 103* 140* 130*   Hyperlipidemia Continue simvastatin  Morbid Obesity  Body mass index is 33.64 kg/m. Patient has been advised to make an attempt to improve diet and exercise patterns to aid in weight loss.  Generalized weakness Lumbar spinal stenosis Frequent falls, recent T11 fracture (previous admission) Neurosurgery last admission recommended conservative management, follow-up outpatient She was placed on TLSO brace, pain control  Currently on pain management with Tylenol and Vicodin as needed PT eval obtained.  Home with PT recommended  Goals of care   Code Status: Full Code     DVT prophylaxis:  heparin injection 5,000 Units Start: 12/29/22 0600   Antimicrobials: None Fluid: Not included currently Consultants: Nephrology Family Communication: Husband not at bedside today  Status: Inpatient Level of care:  Telemetry   Patient from: Home Anticipated d/c to: Home health PT  Needs to continue in-hospital care:  Sodium level improving but still low.  Workup in progress   Diet:  Diet Order             Diet regular Room service appropriate? Yes; Fluid consistency: Thin  Diet effective now                   Scheduled Meds:  Chlorhexidine Gluconate Cloth  6 each Topical Daily   feeding supplement  237 mL Oral BID BM   heparin  5,000 Units Subcutaneous Q8H   insulin aspart  0-6 Units Subcutaneous TID WC   levothyroxine  88 mcg Oral Q0600   simvastatin  20 mg Oral QHS   sodium bicarbonate  650 mg Oral BID    PRN  meds: acetaminophen, albuterol, hydrALAZINE, HYDROcodone-acetaminophen, ondansetron **OR** ondansetron (ZOFRAN) IV, mouth rinse, zolpidem   Infusions:     Antimicrobials: Anti-infectives (From admission, onward)    None       Nutritional status:  Body mass index is 33.64 kg/m.          Objective: Vitals:   01/01/23 1130 01/01/23 1132  BP:    Pulse:    Resp: 19   Temp:  98 F (36.7 C)  SpO2:  97%    Intake/Output Summary (Last 24 hours) at 01/01/2023 1155 Last data filed at 01/01/2023 0821 Gross per 24 hour  Intake 594 ml  Output 900 ml  Net -306 ml   Filed Weights   12/30/22 0500 12/31/22 0500 01/01/23 0500  Weight: 87.3 kg 90.9 kg 88.9 kg   Weight change: -2 kg Body mass index is 33.64 kg/m.   Physical Exam: General exam: Pleasant elderly Caucasian female.  Not in physical distress Skin: No rashes, lesions or ulcers. HEENT: Atraumatic, normocephalic, no obvious bleeding Lungs: Clear to auscultation bilaterally CVS: Regular  rate and rhythm, no murmur GI/Abd: soft, nontender, nondistended, bowel sound present CNS: Alert, awake, slow to respond, oriented to place and person.  Mental status gradually improving today.   Psychiatry: Mood appropriate Extremities: No pedal edema, no calf tenderness  Data Review: I have personally reviewed the laboratory data and studies available.  F/u labs ordered Unresulted Labs (From admission, onward)    None       Total time spent in review of labs and imaging, patient evaluation, formulation of plan, documentation and communication with family: 45 minutes  Signed, Lorin Glass, MD [facility] 01/01/2023

## 2023-01-02 DIAGNOSIS — E871 Hypo-osmolality and hyponatremia: Secondary | ICD-10-CM | POA: Diagnosis not present

## 2023-01-02 DIAGNOSIS — E119 Type 2 diabetes mellitus without complications: Secondary | ICD-10-CM

## 2023-01-02 LAB — COMPREHENSIVE METABOLIC PANEL
ALT: 23 U/L (ref 0–44)
AST: 21 U/L (ref 15–41)
Albumin: 3.8 g/dL (ref 3.5–5.0)
Alkaline Phosphatase: 48 U/L (ref 38–126)
Anion gap: 9 (ref 5–15)
BUN: 13 mg/dL (ref 8–23)
CO2: 21 mmol/L — ABNORMAL LOW (ref 22–32)
Calcium: 9.1 mg/dL (ref 8.9–10.3)
Chloride: 103 mmol/L (ref 98–111)
Creatinine, Ser: 1.15 mg/dL — ABNORMAL HIGH (ref 0.44–1.00)
GFR, Estimated: 47 mL/min — ABNORMAL LOW (ref >60)
Glucose, Bld: 137 mg/dL — ABNORMAL HIGH (ref 70–99)
Potassium: 4.1 mmol/L (ref 3.5–5.1)
Sodium: 133 mmol/L — ABNORMAL LOW (ref 135–145)
Total Bilirubin: 0.7 mg/dL (ref 0.3–1.2)
Total Protein: 6 g/dL — ABNORMAL LOW (ref 6.5–8.1)

## 2023-01-02 LAB — CBC WITH DIFFERENTIAL/PLATELET
Abs Immature Granulocytes: 0.02 10*3/uL (ref 0.00–0.07)
Basophils Absolute: 0 10*3/uL (ref 0.0–0.1)
Basophils Relative: 0 %
Eosinophils Absolute: 0.1 10*3/uL (ref 0.0–0.5)
Eosinophils Relative: 2 %
HCT: 28.9 % — ABNORMAL LOW (ref 36.0–46.0)
Hemoglobin: 9.7 g/dL — ABNORMAL LOW (ref 12.0–15.0)
Immature Granulocytes: 0 %
Lymphocytes Relative: 33 %
Lymphs Abs: 2 10*3/uL (ref 0.7–4.0)
MCH: 31.2 pg (ref 26.0–34.0)
MCHC: 33.6 g/dL (ref 30.0–36.0)
MCV: 92.9 fL (ref 80.0–100.0)
Monocytes Absolute: 0.6 10*3/uL (ref 0.1–1.0)
Monocytes Relative: 10 %
Neutro Abs: 3.3 10*3/uL (ref 1.7–7.7)
Neutrophils Relative %: 55 %
Platelets: 207 10*3/uL (ref 150–400)
RBC: 3.11 MIL/uL — ABNORMAL LOW (ref 3.87–5.11)
RDW: 13.4 % (ref 11.5–15.5)
WBC: 6 10*3/uL (ref 4.0–10.5)
nRBC: 0 % (ref 0.0–0.2)

## 2023-01-02 LAB — AMMONIA: Ammonia: 10 umol/L (ref 9–35)

## 2023-01-02 LAB — GLUCOSE, CAPILLARY: Glucose-Capillary: 128 mg/dL — ABNORMAL HIGH (ref 70–99)

## 2023-01-02 MED ORDER — ZOLPIDEM TARTRATE 5 MG PO TABS: 5.0000 mg | ORAL_TABLET | Freq: Every evening | ORAL | 0 refills | Status: AC | PRN

## 2023-01-02 MED ORDER — SODIUM CHLORIDE 1 G PO TABS: 1.0000 g | ORAL_TABLET | Freq: Three times a day (TID) | ORAL | 0 refills | Status: AC

## 2023-01-02 MED ORDER — AMLODIPINE BESYLATE 5 MG PO TABS
2.5000 mg | ORAL_TABLET | Freq: Every day | ORAL | Status: DC
  Administered 2023-01-02: 2.5 mg via ORAL
  Filled 2023-01-02: qty 1

## 2023-01-02 MED ORDER — AMLODIPINE BESYLATE 2.5 MG PO TABS: 2.5000 mg | ORAL_TABLET | Freq: Every day | ORAL | 0 refills | Status: AC

## 2023-01-02 NOTE — Discharge Summary (Signed)
Physician Discharge Summary  Dawn Bradley ZOX:[id number] DOB: 11/09/1940 DOA: 12/28/2022  PCP: Geoffry Paradise, MD  Admit date: 12/28/2022 Discharge date: 01/02/2023  Admitted From: Home Discharge disposition: Home with home health PT  Recommendations at discharge:  Salt tablet for 10 days.   Fluid restriction to 1200 mL a day.   Stop lisinopril/HCTZ.  You been started on amlodipine for blood pressure control Blood work in next 1 to 2 weeks with PCP. Continue glipizide.  Stop metformin and Mounjaro   Brief narrative: Dawn Bradley is a 82 y.o. 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   Of note, patient had chronic diarrhea for several months which her PCP attributed to metformin and stopped it and switch her to weekly Mounjaro shots.  Her diarrhea has stopped but after 2 shots, patient started having poor appetite, constipation and generalized weakness leading to a fall on 5/15.  She sustained T11 spinal fracture that was treated with pain management and LSO brace and discharged to [facility] on 5/19. Patient and family did not like the facility and left the home after 4 days.  Husband states she continued to be weak at home.  Patient followed-up with PCP.  Greggory Keen was stopped but he continued to have symptoms 6/9, patient was brought to the ED after an episode of fall at home.  In the ED, patient was afebrile, hemodynamically stable Labs showed sodium level significantly low at 110, serum osmolality low at 251, creatinine at 1.36 Started on IV fluid Admitted to [facility] Nephrology consulted.  Subjective: Patient was seen and examined this morning.  Sitting up in recliner.  Not in distress.  Husband at bedside.  Feels much better.  Mental status gradually improving.  Sodium level improved.  Assessment and plan: Acute  hypoosmolar hyponatremia Presented with low sodium level of 110 in the setting of poor oral intake possibly related to [facility] was also on ACEI/HCTZ  On exam, she was dry.  Urine sodium level was low.  Serum osmolality and urine osmolality were both low. History and labs were initially suggestive of hypovolemic hypoosmolar hyponatremia per nephrology Nephrology consultation was obtained.  Patient was given IV hydration with normal saline after which sodium level improved and plateaued at 127. Urine lites were repeated and were suggestive of SIADH.  Patient probably had a mixed picture of hypovolemic hyponatremia as well as SIADH. CT chest 6/12 did not show any evidence of lung cancer.  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  1 dose of tolvaptan was given with remarkable improvement in sodium level. Patient has been started on sodium chloride 1 g 3 times daily.  Discharged on 10-day course of that.  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  Dehydrated on presentation but creatinine remains at baseline.  AKI ruled out.  Recent Labs    12/06/22 0329 12/07/22 8119 12/08/22 0329 12/28/22 1845 12/29/22 0355 12/29/22 0636 12/29/22 1625 12/30/22 0615 12/31/22 0620 01/01/23 0315 01/02/23 0520  BUN 18 15 18  25* 23  --  19 15 13 9 13   CREATININE 1.28* 1.15* 1.31* 1.36* 1.29* 1.41* 1.19* 0.93 1.11* 1.14* 1.15*  CO2 22 23 24  16* 18*  --  17* 16* 18* 19* 21*   Essential hypertension PTA on HCTZ/ACEI Per nephrology, to avoid thiazide in the future because of severe hyponatremia.  To also hold ACEI for 1 to 2 months  for the same reason Blood pressure gradually rising up.  150s this morning.  Started on amlodipine 2.5 mg daily  Acute metabolic encephalopathy Reported progressive confusion for few weeks.   Likely due to significantly low sodium level CT head did not show anyn acute intracranial abnormality, remote left cerebellar infarct UA negative for UTI MRI brain 6/12 did not show any acute intracranial abnormality.  It showed mild chronic microvascular ischemic changes and moderate parenchymal volume loss. Mental status gradually improving.  Fatty liver/liver cirrhosis 6/12, CT scan stated liver cirrhosis.  Ultrasound abdomen 6/13 said fatty liver.  In any case, patient has no clinical evidence of volume overload.  Ammonia level is normal. H.  Follow-up with ultrasound as an outpatient Recent Labs  Lab 01/02/23 0520  AMMONIA 10     Hypothyroidism TSH 0.3 PTA on Synthroid 100 mcg daily.  Currently reduced to 88 mcg daily   H/o DCIS left breast S/p lumpectomy  Continue utpatient follow-up with [facility]    Type 2 diabetes mellitus A1c 6.2 on 12/29/2022 PTA on glipizide 5 mg twice daily.  Okay to resume the same.  I would avoid both metformin and Mounjaro at this time Recent Labs  Lab 01/01/23 0747 01/01/23 1128 01/01/23 1703 01/01/23 2036 01/02/23 0710  GLUCAP 140* 130* 149* 122* 128*   Hyperlipidemia Continue simvastatin  Morbid Obesity  Body mass index is 33.76 kg/m. Patient has been advised to make an attempt to improve diet and exercise patterns to aid in weight loss.  Generalized weakness Lumbar spinal stenosis Frequent falls, recent T11 fracture (previous admission) Neurosurgery last admission recommended conservative management, follow-up outpatient She was placed on TLSO brace, pain control  Currently on pain management with Tylenol and Vicodin as needed PT eval obtained.  Home with PT recommended  Goals of care   Code Status: Full Code   Wounds:  -    Discharge  Exam:   Vitals:   01/01/23 1619 01/01/23 1947 01/02/23 0456 01/02/23 0500  BP: (!) 141/71 (!) 141/67 (!) 150/82   Pulse: 70 69 89   Resp: 18 18 18    Temp: 98.1 F (36.7 C) 97.7 F (36.5 C) 98.3 F (36.8 C)   TempSrc: Oral Oral Oral   SpO2: 99% 100% 99%   Weight:    89.2 kg  Height:        Body mass index is 33.76 kg/m.   General exam: Pleasant elderly Caucasian female.  Not in physical distress Skin: No rashes, lesions or ulcers. HEENT: Atraumatic, normocephalic, no obvious bleeding Lungs: Clear to auscultation bilaterally CVS: Regular rate and rhythm, no murmur GI/Abd: soft, nontender, nondistended, bowel sound present CNS: Alert, awake, slow to respond, oriented to place and person.  Mental status gradually improving .   Psychiatry: Mood appropriate Extremities: No pedal edema, no calf tenderness  Follow ups:    Follow-up Information     [facility] Follow up.   Why: [facility] Contact information: [address] [place] [address] [phone]         Geoffry Paradise, MD  Follow up.   Specialty: Internal Medicine Contact information: [address] [place] [address] [phone]         Geoffry Paradise, MD Follow up.   Specialty: Internal Medicine Contact information: [address] [place] [address] [phone]                 Discharge Instructions:   Discharge Instructions     Call MD for:  difficulty breathing, headache or visual disturbances   Complete by: As directed    Call MD for:  extreme fatigue   Complete by: As directed    Call MD for:  hives   Complete by: As directed    Call MD for:  persistant dizziness or light-headedness   Complete by: As directed    Call MD for:  persistant nausea and vomiting   Complete by: As directed    Call MD for:  severe uncontrolled pain   Complete by: As directed    Call MD for:  temperature >100.4   Complete by: As directed    Diet  Carb Modified   Complete by: As directed    Discharge instructions   Complete by: As directed    Recommendations at discharge:   Salt tablet for 10 days.    Fluid restriction to 1200 mL a day.    Stop lisinopril/HCTZ.  You been started on amlodipine for blood pressure control  Blood work in next 1 to 2 weeks with PCP.  Continue glipizide.  Stop metformin and Mounjaro   Discharge instructions for diabetes mellitus: Check blood sugar 3 times a day and bedtime at home. If blood sugar running above 200 or less than 70 please call your MD to adjust insulin. If you notice signs and symptoms of hypoglycemia (low blood sugar) like jitteriness, confusion, thirst, tremor and sweating, please check blood sugar, drink sugary drink/biscuits/sweets to increase sugar level and call MD or return to ER.    General discharge instructions: Follow with Primary MD Geoffry Paradise, MD in 7 days  Please request your PCP  to go over your hospital tests, procedures, radiology results at the follow up. Please get your medicines reviewed and adjusted.  Your PCP may decide to repeat certain labs or tests as needed. Do not drive, operate heavy machinery, perform activities at heights, swimming or participation in water activities or provide baby sitting services if your were admitted for syncope or siezures until you have seen by Primary MD or a Neurologist and advised to do so again. [place] Controlled Substance Reporting System database was reviewed. Do not drive, operate heavy machinery, perform activities at heights, swim, participate in water activities or provide baby-sitting services while on medications for pain, sleep and mood until your outpatient physician has reevaluated you and advised to do so again.  You are strongly recommended to comply with the dose, frequency and duration of prescribed medications. Activity: As tolerated with Full fall precautions use walker/cane & assistance as  needed Avoid using any recreational substances like cigarette, tobacco, alcohol, or non-prescribed drug. If you experience worsening of your admission symptoms, develop shortness of breath, life threatening emergency, suicidal or homicidal thoughts you must seek medical attention immediately by calling 911 or calling your MD immediately  if symptoms less severe. You must read complete instructions/literature along with all the possible adverse reactions/side effects for all the medicines you take and that have been prescribed to you. Take any new medicine only after you have completely understood and accepted all the  possible adverse reactions/side effects.  Wear Seat belts while driving. You were cared for by a hospitalist during your hospital stay. If you have any questions about your discharge medications or the care you received while you were in the hospital after you are discharged, you can call the unit and ask to speak with the hospitalist or the covering physician. Once you are discharged, your primary care physician will handle any further medical issues. Please note that NO REFILLS for any discharge medications will be authorized once you are discharged, as it is imperative that you return to your primary care physician (or establish a relationship with a primary care physician if you do not have one).   Increase activity slowly   Complete by: As directed        Discharge Medications:   Allergies as of 01/02/2023       Reactions   Morphine Nausea And Vomiting, Other (See Comments), Shortness Of Breath   Reaction: Heart races irregularly.  Patient states she is not allergic to Morphine 03-06-16.   Pholcodine    Other Reaction(s): respiratory distress   Shellfish Allergy Hives, Swelling   Shellfish-derived Products Hives, Other (See Comments), Swelling   Reaction: Red face   Thiazide-type Diuretics Other (See Comments)   Severe hyponatremia Na+ 110 in June 2024   Codeine Other (See  Comments)   REACTION: heart races REACTION: heart races  REACTION: Heart races irregularly, vomiting and shortness of breath   Latex Itching   Other Other (See Comments)   Faux gold causes itching and skin to turn green   Tramadol Nausea And Vomiting        Medication List     STOP taking these medications    acetaminophen 325 MG tablet Commonly known as: TYLENOL   lisinopril-hydrochlorothiazide 20-12.5 MG tablet Commonly known as: ZESTORETIC   Mounjaro 5 MG/0.5ML Pen Generic drug: tirzepatide       TAKE these medications    amLODipine 2.5 MG tablet Commonly known as: NORVASC Take 1 tablet (2.5 mg total) by mouth daily. Start taking on: January 03, 2023   amoxicillin 500 MG capsule Commonly known as: AMOXIL TAKE 4 CAPSULES 1 HOUR PRIOR TO DENTAL PROCEDURE   glipiZIDE 5 MG tablet Commonly known as: GLUCOTROL Take 5 mg by mouth 2 (two) times daily.   HYDROcodone-acetaminophen 5-325 MG tablet Commonly known as: NORCO/VICODIN Take 1 tablet by mouth every 4 (four) hours as needed for moderate pain or severe pain.   letrozole 2.5 MG tablet Commonly known as: FEMARA Take 1 tablet (2.5 mg total) by mouth daily.   levothyroxine 100 MCG tablet Commonly known as: SYNTHROID Take 100 mcg by mouth daily before breakfast.   melatonin 3 MG Tabs tablet Take 1 tablet (3 mg total) by mouth at bedtime.   methocarbamol 500 MG tablet Commonly known as: ROBAXIN Take 500 mg by mouth every 8 (eight) hours as needed for muscle spasms.   polyethylene glycol 17 g packet Commonly known as: MIRALAX / GLYCOLAX Take 17 g by mouth daily.   simvastatin 20 MG tablet Commonly known as: ZOCOR Take 20 mg by mouth at bedtime.   sodium chloride 1 g tablet Take 1 tablet (1 g total) by mouth 3 (three) times daily with meals for 10 days.   valACYclovir 1000 MG tablet Commonly known as: VALTREX Take 1,000 mg by mouth 2 (two) times daily as needed (fever blisters).   Vitamin D  (Ergocalciferol) 1.25 MG (50000 UNIT) Caps capsule Commonly known as:  DRISDOL Take 50,000 Units by mouth every 7 (seven) days. Pt takes on Saturdays.   zolpidem 5 MG tablet Commonly known as: AMBIEN Take 1 tablet (5 mg total) by mouth at bedtime as needed for sleep. What changed:  medication strength how much to take         The results of significant diagnostics from this hospitalization (including imaging, microbiology, ancillary and laboratory) are listed below for reference.    Procedures and Diagnostic Studies:   CT Head Wo Contrast  Result Date: 12/28/2022 CLINICAL DATA:  Head trauma, minor (Age >= 65y), altered mental status EXAM: CT HEAD WITHOUT CONTRAST TECHNIQUE: Contiguous axial images were obtained from the base of the skull through the vertex without intravenous contrast. RADIATION DOSE REDUCTION: This exam was performed according to the departmental dose-optimization program which includes automated exposure control, adjustment of the mA and/or kV according to patient size and/or use of iterative reconstruction technique. COMPARISON:  12/03/2022 FINDINGS: Brain: Normal anatomic configuration. Parenchymal volume loss is commensurate with the patient's age. Mild periventricular white matter changes are present likely reflecting the sequela of small vessel ischemia. Remote left cerebellar infarct again noted. No abnormal intra or extra-axial mass lesion or fluid collection. No abnormal mass effect or midline shift. No evidence of acute intracranial hemorrhage or infarct. Ventricular size is normal. Cerebellum unremarkable. Vascular: No asymmetric hyperdense vasculature at the skull base. Skull: Intact Sinuses/Orbits: Paranasal sinuses are clear. Orbits are unremarkable. Other: Mastoid air cells and middle ear cavities are clear. IMPRESSION: 1. No acute intracranial abnormality. No calvarial fracture. 2. Stable senescent changes. 3. Remote left cerebellar infarct. Electronically Signed    By: Helyn Numbers M.D.   On: 12/28/2022 21:09   CT Thoracic Spine Wo Contrast  Result Date: 12/28/2022 CLINICAL DATA:  Spine fracture, thoracic, traumatic EXAM: CT THORACIC SPINE WITHOUT CONTRAST TECHNIQUE: Multidetector CT images of the thoracic were obtained using the standard protocol without intravenous contrast. RADIATION DOSE REDUCTION: This exam was performed according to the departmental dose-optimization program which includes automated exposure control, adjustment of the mA and/or kV according to patient size and/or use of iterative reconstruction technique. COMPARISON:  Thoracic spine CT 12/03/2022 FINDINGS: Alignment: Normal. Vertebrae: Diffuse flowing anterior osteophytes. Stable lucency through anterior osteophytes at T11 level from prior exam, series 14, image 31 and series 16, image 44. Fracture extends through the superior aspect of the T11 vertebral body, series 16, image 54. This is also unchanged. No acute fracture. Vertebral body heights are normal. Intact posterior elements. Paraspinal and other soft tissues: No paraspinal soft tissue thickening. No acute intrathoracic findings. Aortic atherosclerosis Disc levels: Diffuse anterior spurring and osteophytes. No spinal canal stenosis. IMPRESSION: 1. Unchanged fracture through anterior osteophytes at T11 and superior aspect of T11 vertebral body. No change in alignment from last month. 2. No acute fracture of the thoracic spine. Aortic Atherosclerosis (ICD10-I70.0). Electronically Signed   By: Narda Rutherford M.D.   On: 12/28/2022 20:45   CT Cervical Spine Wo Contrast  Result Date: 12/28/2022 CLINICAL DATA:  Fall in the bathroom, neck trauma. EXAM: CT CERVICAL SPINE WITHOUT CONTRAST TECHNIQUE: Multidetector CT imaging of the cervical spine was performed without intravenous contrast. Multiplanar CT image reconstructions were also generated. RADIATION DOSE REDUCTION: This exam was performed according to the departmental dose-optimization  program which includes automated exposure control, adjustment of the mA and/or kV according to patient size and/or use of iterative reconstruction technique. COMPARISON:  CT cervical spine 12/03/2022 FINDINGS: Alignment: No traumatic malalignment. Chronic grade 1 anterolisthesis  of C4 on C5. Skull base and vertebrae: No acute fracture. Vertebral body heights are maintained. The dens and skull base are intact. Soft tissues and spinal canal: No prevertebral fluid or swelling. No visible canal hematoma. Disc levels: Stable degree of multilevel degenerative disc disease and facet hypertrophy. Upper chest: No acute or unexpected findings. Other: None. IMPRESSION: 1. No acute fracture or traumatic malalignment of the cervical spine. 2. Stable degree of multilevel degenerative disc disease and facet hypertrophy. Electronically Signed   By: Narda Rutherford M.D.   On: 12/28/2022 20:40     Labs:   Basic Metabolic Panel: Recent Labs  Lab 12/28/22 1845 12/29/22 0355 12/29/22 1625 12/29/22 1923 12/30/22 0615 12/30/22 1032 12/31/22 0620 12/31/22 1142 12/31/22 2143 01/01/23 0315 01/02/23 0520  NA 110*   < > 116*   < > 127*   < > 127* 127* 131* 130* 133*  K 4.8   < > 4.1  --  3.2*  --  4.9  --   --  4.2 4.1  CL 80*   < > 89*  --  106  --  101  --   --  104 103  CO2 16*   < > 17*  --  16*  --  18*  --   --  19* 21*  GLUCOSE 110*   < > 131*  --  98  --  110*  --   --  110* 137*  BUN 25*   < > 19  --  15  --  13  --   --  9 13  CREATININE 1.36*   < > 1.19*  --  0.93  --  1.11*  --   --  1.14* 1.15*  CALCIUM 9.1   < > 8.4*  --  6.7*  --  8.8*  --   --  8.8* 9.1  MG 1.8  --   --   --   --   --   --   --   --   --   --    < > = values in this interval not displayed.   GFR Estimated Creatinine Clearance: 40.1 mL/min (A) (by C-G formula based on SCr of 1.15 mg/dL (H)). Liver Function Tests: Recent Labs  Lab 12/28/22 1845 12/29/22 0355 01/02/23 0520  AST 23 20 21   ALT 20 19 23   ALKPHOS 59 55 48   BILITOT 1.2 0.9 0.7  PROT 6.7 6.5 6.0*  ALBUMIN 4.1 3.9 3.8   No results for input(s): "LIPASE", "AMYLASE" in the last 168 hours. Recent Labs  Lab 01/02/23 0520  AMMONIA 10   Coagulation profile No results for input(s): "INR", "PROTIME" in the last 168 hours.  CBC: Recent Labs  Lab 12/28/22 2107 12/29/22 0355 12/29/22 0636 12/30/22 0615 01/02/23 0520  WBC 8.5 8.5 8.1 6.5 6.0  NEUTROABS 6.4  --   --   --  3.3  HGB 11.7* 11.2* 11.3* 9.0* 9.7*  HCT 32.3* 30.6* 31.5* 26.0* 28.9*  MCV 85.4 85.2 86.1 89.7 92.9  PLT 278 256 257 208 207   Cardiac Enzymes: No results for input(s): "CKTOTAL", "CKMB", "CKMBINDEX", "TROPONINI" in the last 168 hours. BNP: Invalid input(s): "POCBNP" CBG: Recent Labs  Lab 01/01/23 0747 01/01/23 1128 01/01/23 1703 01/01/23 2036 01/02/23 0710  GLUCAP 140* 130* 149* 122* 128*   D-Dimer No results for input(s): "DDIMER" in the last 72 hours. Hgb A1c No results for input(s): "HGBA1C" in the last 72 hours. Lipid Profile No  results for input(s): "CHOL", "HDL", "LDLCALC", "TRIG", "CHOLHDL", "LDLDIRECT" in the last 72 hours. Thyroid function studies No results for input(s): "TSH", "T4TOTAL", "T3FREE", "THYROIDAB" in the last 72 hours.  Invalid input(s): "FREET3" Anemia work up No results for input(s): "VITAMINB12", "FOLATE", "FERRITIN", "TIBC", "IRON", "RETICCTPCT" in the last 72 hours. Microbiology Recent Results (from the past 240 hour(s))  MRSA Next Gen by PCR, Nasal     Status: None   Collection Time: 12/29/22 12:20 PM   Specimen: Nasal Mucosa; Nasal Swab  Result Value Ref Range Status   MRSA by PCR Next Gen NOT DETECTED NOT DETECTED Final    Comment: (NOTE) The GeneXpert MRSA Assay (FDA approved for NASAL specimens only), is one component of a comprehensive MRSA colonization surveillance program. It is not intended to diagnose MRSA infection nor to guide or monitor treatment for MRSA infections. Test performance is not FDA approved in  patients less than 54 years old. Performed at [facility], [address], [place], [place] [address]     Time coordinating discharge: 45 minutes  Signed: Melina Schools Ayomikun Starling  [facility] 01/02/2023, 11:42 AM

## 2023-01-02 NOTE — Care Management Important Message (Signed)
Important Message  Patient Details IM Letter given. Name: Dawn Bradley MRN: [id number] Date of Birth: Nov 13, 1940   Medicare Important Message Given:  Yes     Caren Macadam 01/02/2023, 10:34 AM

## 2023-01-02 NOTE — Plan of Care (Signed)

## 2023-01-06 DIAGNOSIS — S72002D Fracture of unspecified part of neck of left femur, subsequent encounter for closed fracture with routine healing: Secondary | ICD-10-CM | POA: Diagnosis not present

## 2023-01-06 DIAGNOSIS — E1159 Type 2 diabetes mellitus with other circulatory complications: Secondary | ICD-10-CM | POA: Diagnosis not present

## 2023-01-06 DIAGNOSIS — I152 Hypertension secondary to endocrine disorders: Secondary | ICD-10-CM | POA: Diagnosis not present

## 2023-01-06 DIAGNOSIS — S22089D Unspecified fracture of T11-T12 vertebra, subsequent encounter for fracture with routine healing: Secondary | ICD-10-CM | POA: Diagnosis not present

## 2023-01-06 DIAGNOSIS — M48061 Spinal stenosis, lumbar region without neurogenic claudication: Secondary | ICD-10-CM | POA: Diagnosis not present

## 2023-01-06 DIAGNOSIS — I700 Atherosclerosis of aorta: Secondary | ICD-10-CM | POA: Diagnosis not present

## 2023-01-07 DIAGNOSIS — H53001 Unspecified amblyopia, right eye: Secondary | ICD-10-CM | POA: Diagnosis not present

## 2023-01-07 DIAGNOSIS — H524 Presbyopia: Secondary | ICD-10-CM | POA: Diagnosis not present

## 2023-01-07 DIAGNOSIS — E119 Type 2 diabetes mellitus without complications: Secondary | ICD-10-CM | POA: Diagnosis not present

## 2023-01-07 DIAGNOSIS — H5203 Hypermetropia, bilateral: Secondary | ICD-10-CM | POA: Diagnosis not present

## 2023-01-07 DIAGNOSIS — H2513 Age-related nuclear cataract, bilateral: Secondary | ICD-10-CM | POA: Diagnosis not present

## 2023-01-09 DIAGNOSIS — S22089A Unspecified fracture of T11-T12 vertebra, initial encounter for closed fracture: Secondary | ICD-10-CM | POA: Diagnosis not present

## 2023-01-09 DIAGNOSIS — K760 Fatty (change of) liver, not elsewhere classified: Secondary | ICD-10-CM | POA: Diagnosis not present

## 2023-01-09 DIAGNOSIS — I129 Hypertensive chronic kidney disease with stage 1 through stage 4 chronic kidney disease, or unspecified chronic kidney disease: Secondary | ICD-10-CM | POA: Diagnosis not present

## 2023-01-09 DIAGNOSIS — N1832 Chronic kidney disease, stage 3b: Secondary | ICD-10-CM | POA: Diagnosis not present

## 2023-01-09 DIAGNOSIS — G9341 Metabolic encephalopathy: Secondary | ICD-10-CM | POA: Diagnosis not present

## 2023-01-09 DIAGNOSIS — E1169 Type 2 diabetes mellitus with other specified complication: Secondary | ICD-10-CM | POA: Diagnosis not present

## 2023-01-09 DIAGNOSIS — E785 Hyperlipidemia, unspecified: Secondary | ICD-10-CM | POA: Diagnosis not present

## 2023-01-09 DIAGNOSIS — C649 Malignant neoplasm of unspecified kidney, except renal pelvis: Secondary | ICD-10-CM | POA: Diagnosis not present

## 2023-01-09 DIAGNOSIS — E039 Hypothyroidism, unspecified: Secondary | ICD-10-CM | POA: Diagnosis not present

## 2023-01-09 DIAGNOSIS — E871 Hypo-osmolality and hyponatremia: Secondary | ICD-10-CM | POA: Diagnosis not present

## 2023-01-09 DIAGNOSIS — I10 Essential (primary) hypertension: Secondary | ICD-10-CM | POA: Diagnosis not present

## 2023-01-11 DIAGNOSIS — E871 Hypo-osmolality and hyponatremia: Secondary | ICD-10-CM | POA: Diagnosis not present

## 2023-01-11 DIAGNOSIS — S72002D Fracture of unspecified part of neck of left femur, subsequent encounter for closed fracture with routine healing: Secondary | ICD-10-CM | POA: Diagnosis not present

## 2023-01-11 DIAGNOSIS — K5901 Slow transit constipation: Secondary | ICD-10-CM | POA: Diagnosis not present

## 2023-01-11 DIAGNOSIS — M1990 Unspecified osteoarthritis, unspecified site: Secondary | ICD-10-CM | POA: Diagnosis not present

## 2023-01-11 DIAGNOSIS — E559 Vitamin D deficiency, unspecified: Secondary | ICD-10-CM | POA: Diagnosis not present

## 2023-01-11 DIAGNOSIS — Z7984 Long term (current) use of oral hypoglycemic drugs: Secondary | ICD-10-CM | POA: Diagnosis not present

## 2023-01-11 DIAGNOSIS — E875 Hyperkalemia: Secondary | ICD-10-CM | POA: Diagnosis not present

## 2023-01-11 DIAGNOSIS — Z7985 Long-term (current) use of injectable non-insulin antidiabetic drugs: Secondary | ICD-10-CM | POA: Diagnosis not present

## 2023-01-11 DIAGNOSIS — I700 Atherosclerosis of aorta: Secondary | ICD-10-CM | POA: Diagnosis not present

## 2023-01-11 DIAGNOSIS — K7460 Unspecified cirrhosis of liver: Secondary | ICD-10-CM | POA: Diagnosis not present

## 2023-01-11 DIAGNOSIS — E1122 Type 2 diabetes mellitus with diabetic chronic kidney disease: Secondary | ICD-10-CM | POA: Diagnosis not present

## 2023-01-11 DIAGNOSIS — K760 Fatty (change of) liver, not elsewhere classified: Secondary | ICD-10-CM | POA: Diagnosis not present

## 2023-01-11 DIAGNOSIS — N1832 Chronic kidney disease, stage 3b: Secondary | ICD-10-CM | POA: Diagnosis not present

## 2023-01-11 DIAGNOSIS — E039 Hypothyroidism, unspecified: Secondary | ICD-10-CM | POA: Diagnosis not present

## 2023-01-11 DIAGNOSIS — Z6835 Body mass index (BMI) 35.0-35.9, adult: Secondary | ICD-10-CM | POA: Diagnosis not present

## 2023-01-11 DIAGNOSIS — K219 Gastro-esophageal reflux disease without esophagitis: Secondary | ICD-10-CM | POA: Diagnosis not present

## 2023-01-11 DIAGNOSIS — I152 Hypertension secondary to endocrine disorders: Secondary | ICD-10-CM | POA: Diagnosis not present

## 2023-01-11 DIAGNOSIS — S22089D Unspecified fracture of T11-T12 vertebra, subsequent encounter for fracture with routine healing: Secondary | ICD-10-CM | POA: Diagnosis not present

## 2023-01-11 DIAGNOSIS — G4700 Insomnia, unspecified: Secondary | ICD-10-CM | POA: Diagnosis not present

## 2023-01-11 DIAGNOSIS — G9341 Metabolic encephalopathy: Secondary | ICD-10-CM | POA: Diagnosis not present

## 2023-01-11 DIAGNOSIS — M48061 Spinal stenosis, lumbar region without neurogenic claudication: Secondary | ICD-10-CM | POA: Diagnosis not present

## 2023-01-11 DIAGNOSIS — Z8559 Personal history of malignant neoplasm of other urinary tract organ: Secondary | ICD-10-CM | POA: Diagnosis not present

## 2023-01-11 DIAGNOSIS — E785 Hyperlipidemia, unspecified: Secondary | ICD-10-CM | POA: Diagnosis not present

## 2023-01-11 DIAGNOSIS — E1159 Type 2 diabetes mellitus with other circulatory complications: Secondary | ICD-10-CM | POA: Diagnosis not present

## 2023-01-12 ENCOUNTER — Other Ambulatory Visit: Payer: Self-pay | Admitting: Internal Medicine

## 2023-01-12 DIAGNOSIS — Z1231 Encounter for screening mammogram for malignant neoplasm of breast: Secondary | ICD-10-CM

## 2023-01-13 DIAGNOSIS — S72002D Fracture of unspecified part of neck of left femur, subsequent encounter for closed fracture with routine healing: Secondary | ICD-10-CM | POA: Diagnosis not present

## 2023-01-13 DIAGNOSIS — E1159 Type 2 diabetes mellitus with other circulatory complications: Secondary | ICD-10-CM | POA: Diagnosis not present

## 2023-01-13 DIAGNOSIS — I152 Hypertension secondary to endocrine disorders: Secondary | ICD-10-CM | POA: Diagnosis not present

## 2023-01-13 DIAGNOSIS — I700 Atherosclerosis of aorta: Secondary | ICD-10-CM | POA: Diagnosis not present

## 2023-01-13 DIAGNOSIS — S22089D Unspecified fracture of T11-T12 vertebra, subsequent encounter for fracture with routine healing: Secondary | ICD-10-CM | POA: Diagnosis not present

## 2023-01-13 DIAGNOSIS — E871 Hypo-osmolality and hyponatremia: Secondary | ICD-10-CM | POA: Diagnosis not present

## 2023-01-21 DIAGNOSIS — I152 Hypertension secondary to endocrine disorders: Secondary | ICD-10-CM | POA: Diagnosis not present

## 2023-01-21 DIAGNOSIS — I700 Atherosclerosis of aorta: Secondary | ICD-10-CM | POA: Diagnosis not present

## 2023-01-21 DIAGNOSIS — S72002D Fracture of unspecified part of neck of left femur, subsequent encounter for closed fracture with routine healing: Secondary | ICD-10-CM | POA: Diagnosis not present

## 2023-01-21 DIAGNOSIS — E1159 Type 2 diabetes mellitus with other circulatory complications: Secondary | ICD-10-CM | POA: Diagnosis not present

## 2023-01-21 DIAGNOSIS — E871 Hypo-osmolality and hyponatremia: Secondary | ICD-10-CM | POA: Diagnosis not present

## 2023-01-21 DIAGNOSIS — S22089D Unspecified fracture of T11-T12 vertebra, subsequent encounter for fracture with routine healing: Secondary | ICD-10-CM | POA: Diagnosis not present

## 2023-02-02 DIAGNOSIS — M48061 Spinal stenosis, lumbar region without neurogenic claudication: Secondary | ICD-10-CM | POA: Diagnosis not present

## 2023-02-04 DIAGNOSIS — M48061 Spinal stenosis, lumbar region without neurogenic claudication: Secondary | ICD-10-CM | POA: Diagnosis not present

## 2023-02-04 DIAGNOSIS — R600 Localized edema: Secondary | ICD-10-CM | POA: Diagnosis not present

## 2023-02-04 DIAGNOSIS — E039 Hypothyroidism, unspecified: Secondary | ICD-10-CM | POA: Diagnosis not present

## 2023-02-04 DIAGNOSIS — T148XXA Other injury of unspecified body region, initial encounter: Secondary | ICD-10-CM | POA: Diagnosis not present

## 2023-02-04 DIAGNOSIS — I129 Hypertensive chronic kidney disease with stage 1 through stage 4 chronic kidney disease, or unspecified chronic kidney disease: Secondary | ICD-10-CM | POA: Diagnosis not present

## 2023-02-04 DIAGNOSIS — N1832 Chronic kidney disease, stage 3b: Secondary | ICD-10-CM | POA: Diagnosis not present

## 2023-02-04 DIAGNOSIS — I10 Essential (primary) hypertension: Secondary | ICD-10-CM | POA: Diagnosis not present

## 2023-02-04 DIAGNOSIS — E785 Hyperlipidemia, unspecified: Secondary | ICD-10-CM | POA: Diagnosis not present

## 2023-02-04 DIAGNOSIS — C649 Malignant neoplasm of unspecified kidney, except renal pelvis: Secondary | ICD-10-CM | POA: Diagnosis not present

## 2023-02-04 DIAGNOSIS — E1169 Type 2 diabetes mellitus with other specified complication: Secondary | ICD-10-CM | POA: Diagnosis not present

## 2023-02-05 ENCOUNTER — Ambulatory Visit (HOSPITAL_COMMUNITY)
Admission: RE | Admit: 2023-02-05 | Discharge: 2023-02-05 | Disposition: A | Discharge status: Home / Self Care | Payer: Medicare Other | Source: Ambulatory Visit | Attending: Vascular Surgery | Admitting: Vascular Surgery

## 2023-02-05 ENCOUNTER — Other Ambulatory Visit (HOSPITAL_COMMUNITY): Payer: Self-pay | Admitting: Internal Medicine

## 2023-02-05 DIAGNOSIS — M5136 Other intervertebral disc degeneration, lumbar region: Secondary | ICD-10-CM | POA: Diagnosis not present

## 2023-02-05 DIAGNOSIS — R609 Edema, unspecified: Secondary | ICD-10-CM

## 2023-02-05 DIAGNOSIS — R937 Abnormal findings on diagnostic imaging of other parts of musculoskeletal system: Secondary | ICD-10-CM | POA: Diagnosis not present

## 2023-02-05 DIAGNOSIS — M48061 Spinal stenosis, lumbar region without neurogenic claudication: Secondary | ICD-10-CM | POA: Diagnosis not present

## 2023-02-19 DIAGNOSIS — S22000A Wedge compression fracture of unspecified thoracic vertebra, initial encounter for closed fracture: Secondary | ICD-10-CM | POA: Diagnosis not present

## 2023-02-19 DIAGNOSIS — M48062 Spinal stenosis, lumbar region with neurogenic claudication: Secondary | ICD-10-CM | POA: Diagnosis not present

## 2023-02-27 DIAGNOSIS — M810 Age-related osteoporosis without current pathological fracture: Secondary | ICD-10-CM | POA: Diagnosis not present

## 2023-02-27 DIAGNOSIS — Z780 Asymptomatic menopausal state: Secondary | ICD-10-CM | POA: Diagnosis not present

## 2023-03-02 DIAGNOSIS — M4310 Spondylolisthesis, site unspecified: Secondary | ICD-10-CM | POA: Diagnosis not present

## 2023-03-02 DIAGNOSIS — M48061 Spinal stenosis, lumbar region without neurogenic claudication: Secondary | ICD-10-CM | POA: Diagnosis not present

## 2023-03-02 DIAGNOSIS — M5416 Radiculopathy, lumbar region: Secondary | ICD-10-CM | POA: Diagnosis not present

## 2023-03-02 DIAGNOSIS — M545 Low back pain, unspecified: Secondary | ICD-10-CM | POA: Diagnosis not present

## 2023-03-16 DIAGNOSIS — I10 Essential (primary) hypertension: Secondary | ICD-10-CM | POA: Diagnosis not present

## 2023-03-16 DIAGNOSIS — M48061 Spinal stenosis, lumbar region without neurogenic claudication: Secondary | ICD-10-CM | POA: Diagnosis not present

## 2023-03-16 DIAGNOSIS — E669 Obesity, unspecified: Secondary | ICD-10-CM | POA: Diagnosis not present

## 2023-03-16 DIAGNOSIS — E785 Hyperlipidemia, unspecified: Secondary | ICD-10-CM | POA: Diagnosis not present

## 2023-03-16 DIAGNOSIS — E1169 Type 2 diabetes mellitus with other specified complication: Secondary | ICD-10-CM | POA: Diagnosis not present

## 2023-03-16 DIAGNOSIS — I700 Atherosclerosis of aorta: Secondary | ICD-10-CM | POA: Diagnosis not present

## 2023-03-26 DIAGNOSIS — E039 Hypothyroidism, unspecified: Secondary | ICD-10-CM | POA: Diagnosis not present

## 2023-03-26 DIAGNOSIS — I10 Essential (primary) hypertension: Secondary | ICD-10-CM | POA: Diagnosis not present

## 2023-03-26 DIAGNOSIS — Z01818 Encounter for other preprocedural examination: Secondary | ICD-10-CM | POA: Diagnosis not present

## 2023-03-26 DIAGNOSIS — Z01810 Encounter for preprocedural cardiovascular examination: Secondary | ICD-10-CM | POA: Diagnosis not present

## 2023-03-26 DIAGNOSIS — D649 Anemia, unspecified: Secondary | ICD-10-CM | POA: Diagnosis not present

## 2023-04-02 ENCOUNTER — Ambulatory Visit
Admission: RE | Admit: 2023-04-02 | Discharge: 2023-04-02 | Disposition: A | Discharge status: Home / Self Care | Payer: Medicare Other | Source: Ambulatory Visit | Attending: Internal Medicine | Admitting: Internal Medicine

## 2023-04-02 DIAGNOSIS — Z1231 Encounter for screening mammogram for malignant neoplasm of breast: Secondary | ICD-10-CM

## 2023-04-07 DIAGNOSIS — D225 Melanocytic nevi of trunk: Secondary | ICD-10-CM | POA: Diagnosis not present

## 2023-04-07 DIAGNOSIS — Z981 Arthrodesis status: Secondary | ICD-10-CM | POA: Diagnosis not present

## 2023-04-07 DIAGNOSIS — Z7189 Other specified counseling: Secondary | ICD-10-CM | POA: Diagnosis not present

## 2023-04-07 DIAGNOSIS — Z8554 Personal history of malignant neoplasm of ureter: Secondary | ICD-10-CM | POA: Diagnosis not present

## 2023-04-07 DIAGNOSIS — M48062 Spinal stenosis, lumbar region with neurogenic claudication: Secondary | ICD-10-CM | POA: Diagnosis present

## 2023-04-07 DIAGNOSIS — I8312 Varicose veins of left lower extremity with inflammation: Secondary | ICD-10-CM | POA: Diagnosis not present

## 2023-04-07 DIAGNOSIS — Z853 Personal history of malignant neoplasm of breast: Secondary | ICD-10-CM | POA: Diagnosis not present

## 2023-04-07 DIAGNOSIS — M8589 Other specified disorders of bone density and structure, multiple sites: Secondary | ICD-10-CM | POA: Diagnosis not present

## 2023-04-07 DIAGNOSIS — L821 Other seborrheic keratosis: Secondary | ICD-10-CM | POA: Diagnosis not present

## 2023-04-07 DIAGNOSIS — E7800 Pure hypercholesterolemia, unspecified: Secondary | ICD-10-CM | POA: Diagnosis present

## 2023-04-07 DIAGNOSIS — M48061 Spinal stenosis, lumbar region without neurogenic claudication: Secondary | ICD-10-CM | POA: Diagnosis not present

## 2023-04-07 DIAGNOSIS — M8580 Other specified disorders of bone density and structure, unspecified site: Secondary | ICD-10-CM | POA: Diagnosis not present

## 2023-04-07 DIAGNOSIS — I872 Venous insufficiency (chronic) (peripheral): Secondary | ICD-10-CM | POA: Diagnosis not present

## 2023-04-07 DIAGNOSIS — M5136 Other intervertebral disc degeneration, lumbar region: Secondary | ICD-10-CM | POA: Diagnosis not present

## 2023-04-07 DIAGNOSIS — Z87891 Personal history of nicotine dependence: Secondary | ICD-10-CM | POA: Diagnosis not present

## 2023-04-07 DIAGNOSIS — Z5189 Encounter for other specified aftercare: Secondary | ICD-10-CM | POA: Diagnosis not present

## 2023-04-07 DIAGNOSIS — Z6833 Body mass index (BMI) 33.0-33.9, adult: Secondary | ICD-10-CM | POA: Diagnosis not present

## 2023-04-07 DIAGNOSIS — I129 Hypertensive chronic kidney disease with stage 1 through stage 4 chronic kidney disease, or unspecified chronic kidney disease: Secondary | ICD-10-CM | POA: Diagnosis present

## 2023-04-07 DIAGNOSIS — Z789 Other specified health status: Secondary | ICD-10-CM | POA: Diagnosis not present

## 2023-04-07 DIAGNOSIS — E039 Hypothyroidism, unspecified: Secondary | ICD-10-CM | POA: Diagnosis present

## 2023-04-07 DIAGNOSIS — E1122 Type 2 diabetes mellitus with diabetic chronic kidney disease: Secondary | ICD-10-CM | POA: Diagnosis present

## 2023-04-07 DIAGNOSIS — D2272 Melanocytic nevi of left lower limb, including hip: Secondary | ICD-10-CM | POA: Diagnosis not present

## 2023-04-07 DIAGNOSIS — I8311 Varicose veins of right lower extremity with inflammation: Secondary | ICD-10-CM | POA: Diagnosis not present

## 2023-04-07 DIAGNOSIS — E669 Obesity, unspecified: Secondary | ICD-10-CM | POA: Diagnosis present

## 2023-04-07 DIAGNOSIS — K760 Fatty (change of) liver, not elsewhere classified: Secondary | ICD-10-CM | POA: Diagnosis present

## 2023-04-07 DIAGNOSIS — M4316 Spondylolisthesis, lumbar region: Secondary | ICD-10-CM | POA: Diagnosis present

## 2023-04-07 DIAGNOSIS — D2262 Melanocytic nevi of left upper limb, including shoulder: Secondary | ICD-10-CM | POA: Diagnosis not present

## 2023-04-07 DIAGNOSIS — M5116 Intervertebral disc disorders with radiculopathy, lumbar region: Secondary | ICD-10-CM | POA: Diagnosis present

## 2023-04-07 DIAGNOSIS — N183 Chronic kidney disease, stage 3 unspecified: Secondary | ICD-10-CM | POA: Diagnosis present

## 2023-04-07 DIAGNOSIS — Z7409 Other reduced mobility: Secondary | ICD-10-CM | POA: Diagnosis not present

## 2023-04-07 DIAGNOSIS — G8929 Other chronic pain: Secondary | ICD-10-CM | POA: Diagnosis present

## 2023-04-07 DIAGNOSIS — M4310 Spondylolisthesis, site unspecified: Secondary | ICD-10-CM | POA: Diagnosis not present

## 2023-04-08 DIAGNOSIS — D2272 Melanocytic nevi of left lower limb, including hip: Secondary | ICD-10-CM | POA: Diagnosis not present

## 2023-04-08 DIAGNOSIS — I8311 Varicose veins of right lower extremity with inflammation: Secondary | ICD-10-CM | POA: Diagnosis not present

## 2023-04-08 DIAGNOSIS — I8312 Varicose veins of left lower extremity with inflammation: Secondary | ICD-10-CM | POA: Diagnosis not present

## 2023-04-08 DIAGNOSIS — D2262 Melanocytic nevi of left upper limb, including shoulder: Secondary | ICD-10-CM | POA: Diagnosis not present

## 2023-04-08 DIAGNOSIS — I872 Venous insufficiency (chronic) (peripheral): Secondary | ICD-10-CM | POA: Diagnosis not present

## 2023-04-08 DIAGNOSIS — L821 Other seborrheic keratosis: Secondary | ICD-10-CM | POA: Diagnosis not present

## 2023-04-08 DIAGNOSIS — D225 Melanocytic nevi of trunk: Secondary | ICD-10-CM | POA: Diagnosis not present

## 2023-04-10 DIAGNOSIS — M5116 Intervertebral disc disorders with radiculopathy, lumbar region: Secondary | ICD-10-CM | POA: Diagnosis present

## 2023-04-10 DIAGNOSIS — K760 Fatty (change of) liver, not elsewhere classified: Secondary | ICD-10-CM | POA: Diagnosis present

## 2023-04-10 DIAGNOSIS — Z87891 Personal history of nicotine dependence: Secondary | ICD-10-CM | POA: Diagnosis not present

## 2023-04-10 DIAGNOSIS — G8929 Other chronic pain: Secondary | ICD-10-CM | POA: Diagnosis present

## 2023-04-10 DIAGNOSIS — M4310 Spondylolisthesis, site unspecified: Secondary | ICD-10-CM | POA: Diagnosis not present

## 2023-04-10 DIAGNOSIS — Z8554 Personal history of malignant neoplasm of ureter: Secondary | ICD-10-CM | POA: Diagnosis not present

## 2023-04-10 DIAGNOSIS — Z6833 Body mass index (BMI) 33.0-33.9, adult: Secondary | ICD-10-CM | POA: Diagnosis not present

## 2023-04-10 DIAGNOSIS — E1122 Type 2 diabetes mellitus with diabetic chronic kidney disease: Secondary | ICD-10-CM | POA: Diagnosis present

## 2023-04-10 DIAGNOSIS — E7800 Pure hypercholesterolemia, unspecified: Secondary | ICD-10-CM | POA: Diagnosis present

## 2023-04-10 DIAGNOSIS — Z5189 Encounter for other specified aftercare: Secondary | ICD-10-CM | POA: Diagnosis not present

## 2023-04-10 DIAGNOSIS — Z981 Arthrodesis status: Secondary | ICD-10-CM | POA: Diagnosis not present

## 2023-04-10 DIAGNOSIS — N183 Chronic kidney disease, stage 3 unspecified: Secondary | ICD-10-CM | POA: Diagnosis present

## 2023-04-10 DIAGNOSIS — E039 Hypothyroidism, unspecified: Secondary | ICD-10-CM | POA: Diagnosis present

## 2023-04-10 DIAGNOSIS — Z789 Other specified health status: Secondary | ICD-10-CM | POA: Diagnosis not present

## 2023-04-10 DIAGNOSIS — I129 Hypertensive chronic kidney disease with stage 1 through stage 4 chronic kidney disease, or unspecified chronic kidney disease: Secondary | ICD-10-CM | POA: Diagnosis present

## 2023-04-10 DIAGNOSIS — M48062 Spinal stenosis, lumbar region with neurogenic claudication: Secondary | ICD-10-CM | POA: Diagnosis present

## 2023-04-10 DIAGNOSIS — Z853 Personal history of malignant neoplasm of breast: Secondary | ICD-10-CM | POA: Diagnosis not present

## 2023-04-10 DIAGNOSIS — E669 Obesity, unspecified: Secondary | ICD-10-CM | POA: Diagnosis present

## 2023-04-10 DIAGNOSIS — M4316 Spondylolisthesis, lumbar region: Secondary | ICD-10-CM | POA: Diagnosis present

## 2023-04-10 DIAGNOSIS — Z7409 Other reduced mobility: Secondary | ICD-10-CM | POA: Diagnosis not present

## 2023-04-10 DIAGNOSIS — M48061 Spinal stenosis, lumbar region without neurogenic claudication: Secondary | ICD-10-CM | POA: Diagnosis not present

## 2023-04-10 DIAGNOSIS — M5136 Other intervertebral disc degeneration, lumbar region: Secondary | ICD-10-CM | POA: Diagnosis not present

## 2023-04-14 DIAGNOSIS — I10 Essential (primary) hypertension: Secondary | ICD-10-CM | POA: Diagnosis not present

## 2023-04-14 DIAGNOSIS — M79661 Pain in right lower leg: Secondary | ICD-10-CM | POA: Diagnosis not present

## 2023-04-14 DIAGNOSIS — Z96653 Presence of artificial knee joint, bilateral: Secondary | ICD-10-CM | POA: Diagnosis not present

## 2023-04-14 DIAGNOSIS — Z981 Arthrodesis status: Secondary | ICD-10-CM | POA: Diagnosis not present

## 2023-04-14 DIAGNOSIS — E875 Hyperkalemia: Secondary | ICD-10-CM | POA: Diagnosis not present

## 2023-04-14 DIAGNOSIS — Z4789 Encounter for other orthopedic aftercare: Secondary | ICD-10-CM | POA: Diagnosis not present

## 2023-04-14 DIAGNOSIS — R4701 Aphasia: Secondary | ICD-10-CM | POA: Diagnosis not present

## 2023-04-14 DIAGNOSIS — G4700 Insomnia, unspecified: Secondary | ICD-10-CM | POA: Diagnosis not present

## 2023-04-14 DIAGNOSIS — I129 Hypertensive chronic kidney disease with stage 1 through stage 4 chronic kidney disease, or unspecified chronic kidney disease: Secondary | ICD-10-CM | POA: Diagnosis not present

## 2023-04-14 DIAGNOSIS — Z23 Encounter for immunization: Secondary | ICD-10-CM | POA: Diagnosis not present

## 2023-04-14 DIAGNOSIS — E039 Hypothyroidism, unspecified: Secondary | ICD-10-CM | POA: Diagnosis not present

## 2023-04-14 DIAGNOSIS — M5416 Radiculopathy, lumbar region: Secondary | ICD-10-CM | POA: Diagnosis not present

## 2023-04-14 DIAGNOSIS — E119 Type 2 diabetes mellitus without complications: Secondary | ICD-10-CM | POA: Diagnosis not present

## 2023-04-14 DIAGNOSIS — Z5189 Encounter for other specified aftercare: Secondary | ICD-10-CM | POA: Diagnosis not present

## 2023-04-14 DIAGNOSIS — E871 Hypo-osmolality and hyponatremia: Secondary | ICD-10-CM | POA: Diagnosis not present

## 2023-04-14 DIAGNOSIS — B9620 Unspecified Escherichia coli [E. coli] as the cause of diseases classified elsewhere: Secondary | ICD-10-CM | POA: Diagnosis not present

## 2023-04-14 DIAGNOSIS — R2689 Other abnormalities of gait and mobility: Secondary | ICD-10-CM | POA: Diagnosis not present

## 2023-04-14 DIAGNOSIS — N390 Urinary tract infection, site not specified: Secondary | ICD-10-CM | POA: Diagnosis not present

## 2023-04-14 DIAGNOSIS — Z7409 Other reduced mobility: Secondary | ICD-10-CM | POA: Diagnosis not present

## 2023-04-14 DIAGNOSIS — M48062 Spinal stenosis, lumbar region with neurogenic claudication: Secondary | ICD-10-CM | POA: Diagnosis not present

## 2023-04-14 DIAGNOSIS — I951 Orthostatic hypotension: Secondary | ICD-10-CM | POA: Diagnosis not present

## 2023-04-14 DIAGNOSIS — M8580 Other specified disorders of bone density and structure, unspecified site: Secondary | ICD-10-CM | POA: Diagnosis not present

## 2023-04-14 DIAGNOSIS — E1122 Type 2 diabetes mellitus with diabetic chronic kidney disease: Secondary | ICD-10-CM | POA: Diagnosis not present

## 2023-04-14 DIAGNOSIS — R140 Abdominal distension (gaseous): Secondary | ICD-10-CM | POA: Diagnosis not present

## 2023-04-14 DIAGNOSIS — L03116 Cellulitis of left lower limb: Secondary | ICD-10-CM | POA: Diagnosis not present

## 2023-04-14 DIAGNOSIS — E0590 Thyrotoxicosis, unspecified without thyrotoxic crisis or storm: Secondary | ICD-10-CM | POA: Diagnosis not present

## 2023-04-14 DIAGNOSIS — Z789 Other specified health status: Secondary | ICD-10-CM | POA: Diagnosis not present

## 2023-04-14 DIAGNOSIS — M4316 Spondylolisthesis, lumbar region: Secondary | ICD-10-CM | POA: Diagnosis not present

## 2023-04-14 DIAGNOSIS — Z9049 Acquired absence of other specified parts of digestive tract: Secondary | ICD-10-CM | POA: Diagnosis not present

## 2023-04-14 DIAGNOSIS — N183 Chronic kidney disease, stage 3 unspecified: Secondary | ICD-10-CM | POA: Diagnosis not present

## 2023-04-15 DIAGNOSIS — M79661 Pain in right lower leg: Secondary | ICD-10-CM | POA: Diagnosis not present

## 2023-04-15 DIAGNOSIS — Z789 Other specified health status: Secondary | ICD-10-CM | POA: Diagnosis not present

## 2023-04-15 DIAGNOSIS — Z7409 Other reduced mobility: Secondary | ICD-10-CM | POA: Diagnosis not present

## 2023-04-16 DIAGNOSIS — Z789 Other specified health status: Secondary | ICD-10-CM | POA: Diagnosis not present

## 2023-04-16 DIAGNOSIS — Z7409 Other reduced mobility: Secondary | ICD-10-CM | POA: Diagnosis not present

## 2023-04-17 DIAGNOSIS — Z789 Other specified health status: Secondary | ICD-10-CM | POA: Diagnosis not present

## 2023-04-17 DIAGNOSIS — Z7409 Other reduced mobility: Secondary | ICD-10-CM | POA: Diagnosis not present

## 2023-04-19 DIAGNOSIS — R140 Abdominal distension (gaseous): Secondary | ICD-10-CM | POA: Diagnosis not present

## 2023-04-20 DIAGNOSIS — Z7409 Other reduced mobility: Secondary | ICD-10-CM | POA: Diagnosis not present

## 2023-04-20 DIAGNOSIS — Z789 Other specified health status: Secondary | ICD-10-CM | POA: Diagnosis not present

## 2023-04-21 DIAGNOSIS — Z7409 Other reduced mobility: Secondary | ICD-10-CM | POA: Diagnosis not present

## 2023-04-21 DIAGNOSIS — Z789 Other specified health status: Secondary | ICD-10-CM | POA: Diagnosis not present

## 2023-04-22 DIAGNOSIS — Z789 Other specified health status: Secondary | ICD-10-CM | POA: Diagnosis not present

## 2023-04-22 DIAGNOSIS — Z7409 Other reduced mobility: Secondary | ICD-10-CM | POA: Diagnosis not present

## 2023-04-23 DIAGNOSIS — Z789 Other specified health status: Secondary | ICD-10-CM | POA: Diagnosis not present

## 2023-04-23 DIAGNOSIS — Z7409 Other reduced mobility: Secondary | ICD-10-CM | POA: Diagnosis not present

## 2023-04-24 DIAGNOSIS — Z7409 Other reduced mobility: Secondary | ICD-10-CM | POA: Diagnosis not present

## 2023-04-24 DIAGNOSIS — Z789 Other specified health status: Secondary | ICD-10-CM | POA: Diagnosis not present

## 2023-04-27 DIAGNOSIS — Z789 Other specified health status: Secondary | ICD-10-CM | POA: Diagnosis not present

## 2023-04-27 DIAGNOSIS — Z7409 Other reduced mobility: Secondary | ICD-10-CM | POA: Diagnosis not present

## 2023-04-28 DIAGNOSIS — Z789 Other specified health status: Secondary | ICD-10-CM | POA: Diagnosis not present

## 2023-04-28 DIAGNOSIS — Z7409 Other reduced mobility: Secondary | ICD-10-CM | POA: Diagnosis not present

## 2023-04-29 DIAGNOSIS — Z789 Other specified health status: Secondary | ICD-10-CM | POA: Diagnosis not present

## 2023-04-29 DIAGNOSIS — Z7409 Other reduced mobility: Secondary | ICD-10-CM | POA: Diagnosis not present

## 2023-04-30 DIAGNOSIS — Z789 Other specified health status: Secondary | ICD-10-CM | POA: Diagnosis not present

## 2023-04-30 DIAGNOSIS — E875 Hyperkalemia: Secondary | ICD-10-CM | POA: Diagnosis not present

## 2023-04-30 DIAGNOSIS — Z7409 Other reduced mobility: Secondary | ICD-10-CM | POA: Diagnosis not present

## 2023-05-01 DIAGNOSIS — Z789 Other specified health status: Secondary | ICD-10-CM | POA: Diagnosis not present

## 2023-05-01 DIAGNOSIS — Z7409 Other reduced mobility: Secondary | ICD-10-CM | POA: Diagnosis not present

## 2023-05-02 DIAGNOSIS — Z7409 Other reduced mobility: Secondary | ICD-10-CM | POA: Diagnosis not present

## 2023-05-02 DIAGNOSIS — Z789 Other specified health status: Secondary | ICD-10-CM | POA: Diagnosis not present

## 2023-05-04 DIAGNOSIS — Z7409 Other reduced mobility: Secondary | ICD-10-CM | POA: Diagnosis not present

## 2023-05-04 DIAGNOSIS — Z789 Other specified health status: Secondary | ICD-10-CM | POA: Diagnosis not present

## 2023-05-05 DIAGNOSIS — Z7409 Other reduced mobility: Secondary | ICD-10-CM | POA: Diagnosis not present

## 2023-05-05 DIAGNOSIS — Z789 Other specified health status: Secondary | ICD-10-CM | POA: Diagnosis not present

## 2023-05-11 DIAGNOSIS — M8000XA Age-related osteoporosis with current pathological fracture, unspecified site, initial encounter for fracture: Secondary | ICD-10-CM | POA: Diagnosis not present

## 2023-05-11 DIAGNOSIS — M4310 Spondylolisthesis, site unspecified: Secondary | ICD-10-CM | POA: Diagnosis not present

## 2023-05-11 DIAGNOSIS — T84498A Other mechanical complication of other internal orthopedic devices, implants and grafts, initial encounter: Secondary | ICD-10-CM | POA: Diagnosis not present

## 2023-05-11 DIAGNOSIS — M5416 Radiculopathy, lumbar region: Secondary | ICD-10-CM | POA: Diagnosis not present

## 2023-05-11 DIAGNOSIS — Z981 Arthrodesis status: Secondary | ICD-10-CM | POA: Diagnosis not present

## 2023-05-12 DIAGNOSIS — E1169 Type 2 diabetes mellitus with other specified complication: Secondary | ICD-10-CM | POA: Diagnosis not present

## 2023-05-12 DIAGNOSIS — Z981 Arthrodesis status: Secondary | ICD-10-CM | POA: Diagnosis not present

## 2023-05-12 DIAGNOSIS — E785 Hyperlipidemia, unspecified: Secondary | ICD-10-CM | POA: Diagnosis not present

## 2023-05-12 DIAGNOSIS — E039 Hypothyroidism, unspecified: Secondary | ICD-10-CM | POA: Diagnosis not present

## 2023-05-12 DIAGNOSIS — E871 Hypo-osmolality and hyponatremia: Secondary | ICD-10-CM | POA: Diagnosis not present

## 2023-05-12 DIAGNOSIS — R110 Nausea: Secondary | ICD-10-CM | POA: Diagnosis not present

## 2023-05-12 DIAGNOSIS — Z79899 Other long term (current) drug therapy: Secondary | ICD-10-CM | POA: Diagnosis not present

## 2023-05-12 DIAGNOSIS — Z7409 Other reduced mobility: Secondary | ICD-10-CM | POA: Diagnosis not present

## 2023-05-12 DIAGNOSIS — M5416 Radiculopathy, lumbar region: Secondary | ICD-10-CM | POA: Diagnosis not present

## 2023-05-12 DIAGNOSIS — M4856XA Collapsed vertebra, not elsewhere classified, lumbar region, initial encounter for fracture: Secondary | ICD-10-CM | POA: Diagnosis not present

## 2023-05-12 DIAGNOSIS — M48061 Spinal stenosis, lumbar region without neurogenic claudication: Secondary | ICD-10-CM | POA: Diagnosis not present

## 2023-05-12 DIAGNOSIS — I10 Essential (primary) hypertension: Secondary | ICD-10-CM | POA: Diagnosis not present

## 2023-05-15 DIAGNOSIS — Z981 Arthrodesis status: Secondary | ICD-10-CM | POA: Diagnosis not present

## 2023-05-15 DIAGNOSIS — Z7409 Other reduced mobility: Secondary | ICD-10-CM | POA: Diagnosis not present

## 2023-05-15 DIAGNOSIS — K5903 Drug induced constipation: Secondary | ICD-10-CM | POA: Diagnosis not present

## 2023-05-15 DIAGNOSIS — I10 Essential (primary) hypertension: Secondary | ICD-10-CM | POA: Diagnosis not present

## 2023-05-15 DIAGNOSIS — Z79899 Other long term (current) drug therapy: Secondary | ICD-10-CM | POA: Diagnosis not present

## 2023-05-15 DIAGNOSIS — M48061 Spinal stenosis, lumbar region without neurogenic claudication: Secondary | ICD-10-CM | POA: Diagnosis not present

## 2023-05-15 DIAGNOSIS — E875 Hyperkalemia: Secondary | ICD-10-CM | POA: Diagnosis not present

## 2023-05-15 DIAGNOSIS — M5416 Radiculopathy, lumbar region: Secondary | ICD-10-CM | POA: Diagnosis not present

## 2023-05-15 DIAGNOSIS — K625 Hemorrhage of anus and rectum: Secondary | ICD-10-CM | POA: Diagnosis not present

## 2023-05-18 DIAGNOSIS — I10 Essential (primary) hypertension: Secondary | ICD-10-CM | POA: Diagnosis not present

## 2023-05-18 DIAGNOSIS — Z981 Arthrodesis status: Secondary | ICD-10-CM | POA: Diagnosis not present

## 2023-05-18 DIAGNOSIS — M8580 Other specified disorders of bone density and structure, unspecified site: Secondary | ICD-10-CM | POA: Diagnosis not present

## 2023-05-18 DIAGNOSIS — Z7189 Other specified counseling: Secondary | ICD-10-CM | POA: Diagnosis not present

## 2023-07-07 DIAGNOSIS — T84498A Other mechanical complication of other internal orthopedic devices, implants and grafts, initial encounter: Secondary | ICD-10-CM | POA: Diagnosis not present

## 2023-07-07 DIAGNOSIS — Z981 Arthrodesis status: Secondary | ICD-10-CM | POA: Diagnosis not present

## 2023-07-07 DIAGNOSIS — M5416 Radiculopathy, lumbar region: Secondary | ICD-10-CM | POA: Diagnosis not present

## 2023-08-04 DIAGNOSIS — N281 Cyst of kidney, acquired: Secondary | ICD-10-CM | POA: Diagnosis not present

## 2023-08-04 DIAGNOSIS — C689 Malignant neoplasm of urinary organ, unspecified: Secondary | ICD-10-CM | POA: Diagnosis not present

## 2023-08-04 DIAGNOSIS — Z905 Acquired absence of kidney: Secondary | ICD-10-CM | POA: Diagnosis not present

## 2023-08-14 DIAGNOSIS — Z981 Arthrodesis status: Secondary | ICD-10-CM | POA: Diagnosis not present

## 2023-08-14 DIAGNOSIS — M4316 Spondylolisthesis, lumbar region: Secondary | ICD-10-CM | POA: Diagnosis not present

## 2023-08-17 DIAGNOSIS — M4310 Spondylolisthesis, site unspecified: Secondary | ICD-10-CM | POA: Diagnosis not present

## 2023-08-17 DIAGNOSIS — T84498A Other mechanical complication of other internal orthopedic devices, implants and grafts, initial encounter: Secondary | ICD-10-CM | POA: Diagnosis not present

## 2023-08-17 DIAGNOSIS — R200 Anesthesia of skin: Secondary | ICD-10-CM | POA: Diagnosis not present

## 2023-08-17 DIAGNOSIS — R202 Paresthesia of skin: Secondary | ICD-10-CM | POA: Diagnosis not present

## 2023-08-17 DIAGNOSIS — Z981 Arthrodesis status: Secondary | ICD-10-CM | POA: Diagnosis not present

## 2023-08-17 DIAGNOSIS — M8000XG Age-related osteoporosis with current pathological fracture, unspecified site, subsequent encounter for fracture with delayed healing: Secondary | ICD-10-CM | POA: Diagnosis not present

## 2023-08-26 DIAGNOSIS — E1169 Type 2 diabetes mellitus with other specified complication: Secondary | ICD-10-CM | POA: Diagnosis not present

## 2023-08-26 DIAGNOSIS — Z853 Personal history of malignant neoplasm of breast: Secondary | ICD-10-CM | POA: Diagnosis not present

## 2023-08-26 DIAGNOSIS — N1832 Chronic kidney disease, stage 3b: Secondary | ICD-10-CM | POA: Diagnosis not present

## 2023-08-26 DIAGNOSIS — I700 Atherosclerosis of aorta: Secondary | ICD-10-CM | POA: Diagnosis not present

## 2023-08-26 DIAGNOSIS — Z0000 Encounter for general adult medical examination without abnormal findings: Secondary | ICD-10-CM | POA: Diagnosis not present

## 2023-08-26 DIAGNOSIS — Z1331 Encounter for screening for depression: Secondary | ICD-10-CM | POA: Diagnosis not present

## 2023-08-26 DIAGNOSIS — Z1339 Encounter for screening examination for other mental health and behavioral disorders: Secondary | ICD-10-CM | POA: Diagnosis not present

## 2023-08-26 DIAGNOSIS — G4700 Insomnia, unspecified: Secondary | ICD-10-CM | POA: Diagnosis not present

## 2023-08-26 DIAGNOSIS — M48061 Spinal stenosis, lumbar region without neurogenic claudication: Secondary | ICD-10-CM | POA: Diagnosis not present

## 2023-08-26 DIAGNOSIS — E785 Hyperlipidemia, unspecified: Secondary | ICD-10-CM | POA: Diagnosis not present

## 2023-08-26 DIAGNOSIS — E559 Vitamin D deficiency, unspecified: Secondary | ICD-10-CM | POA: Diagnosis not present

## 2023-08-26 DIAGNOSIS — E039 Hypothyroidism, unspecified: Secondary | ICD-10-CM | POA: Diagnosis not present

## 2023-08-26 DIAGNOSIS — G629 Polyneuropathy, unspecified: Secondary | ICD-10-CM | POA: Diagnosis not present

## 2023-08-26 DIAGNOSIS — R82998 Other abnormal findings in urine: Secondary | ICD-10-CM | POA: Diagnosis not present

## 2023-08-26 DIAGNOSIS — I129 Hypertensive chronic kidney disease with stage 1 through stage 4 chronic kidney disease, or unspecified chronic kidney disease: Secondary | ICD-10-CM | POA: Diagnosis not present

## 2023-08-26 DIAGNOSIS — K760 Fatty (change of) liver, not elsewhere classified: Secondary | ICD-10-CM | POA: Diagnosis not present

## 2023-08-26 DIAGNOSIS — I10 Essential (primary) hypertension: Secondary | ICD-10-CM | POA: Diagnosis not present

## 2023-08-31 DIAGNOSIS — D2271 Melanocytic nevi of right lower limb, including hip: Secondary | ICD-10-CM | POA: Diagnosis not present

## 2023-08-31 DIAGNOSIS — L820 Inflamed seborrheic keratosis: Secondary | ICD-10-CM | POA: Diagnosis not present

## 2023-12-02 DIAGNOSIS — E875 Hyperkalemia: Secondary | ICD-10-CM | POA: Diagnosis not present

## 2023-12-02 DIAGNOSIS — G629 Polyneuropathy, unspecified: Secondary | ICD-10-CM | POA: Diagnosis not present

## 2023-12-02 DIAGNOSIS — N1832 Chronic kidney disease, stage 3b: Secondary | ICD-10-CM | POA: Diagnosis not present

## 2023-12-02 DIAGNOSIS — I129 Hypertensive chronic kidney disease with stage 1 through stage 4 chronic kidney disease, or unspecified chronic kidney disease: Secondary | ICD-10-CM | POA: Diagnosis not present

## 2023-12-02 DIAGNOSIS — I700 Atherosclerosis of aorta: Secondary | ICD-10-CM | POA: Diagnosis not present

## 2023-12-02 DIAGNOSIS — E785 Hyperlipidemia, unspecified: Secondary | ICD-10-CM | POA: Diagnosis not present

## 2023-12-02 DIAGNOSIS — E1169 Type 2 diabetes mellitus with other specified complication: Secondary | ICD-10-CM | POA: Diagnosis not present

## 2024-01-05 DIAGNOSIS — L820 Inflamed seborrheic keratosis: Secondary | ICD-10-CM | POA: Diagnosis not present

## 2024-01-05 DIAGNOSIS — H2513 Age-related nuclear cataract, bilateral: Secondary | ICD-10-CM | POA: Diagnosis not present

## 2024-01-05 DIAGNOSIS — Z419 Encounter for procedure for purposes other than remedying health state, unspecified: Secondary | ICD-10-CM | POA: Diagnosis not present

## 2024-01-05 DIAGNOSIS — E109 Type 1 diabetes mellitus without complications: Secondary | ICD-10-CM | POA: Diagnosis not present

## 2024-01-05 DIAGNOSIS — H53001 Unspecified amblyopia, right eye: Secondary | ICD-10-CM | POA: Diagnosis not present

## 2024-01-05 DIAGNOSIS — H5203 Hypermetropia, bilateral: Secondary | ICD-10-CM | POA: Diagnosis not present

## 2024-03-01 ENCOUNTER — Other Ambulatory Visit: Payer: Self-pay | Admitting: Internal Medicine

## 2024-03-01 DIAGNOSIS — Z1231 Encounter for screening mammogram for malignant neoplasm of breast: Secondary | ICD-10-CM

## 2024-03-02 DIAGNOSIS — C50919 Malignant neoplasm of unspecified site of unspecified female breast: Secondary | ICD-10-CM | POA: Diagnosis not present

## 2024-03-02 DIAGNOSIS — E785 Hyperlipidemia, unspecified: Secondary | ICD-10-CM | POA: Diagnosis not present

## 2024-03-02 DIAGNOSIS — E1169 Type 2 diabetes mellitus with other specified complication: Secondary | ICD-10-CM | POA: Diagnosis not present

## 2024-03-02 DIAGNOSIS — I10 Essential (primary) hypertension: Secondary | ICD-10-CM | POA: Diagnosis not present

## 2024-03-02 DIAGNOSIS — C649 Malignant neoplasm of unspecified kidney, except renal pelvis: Secondary | ICD-10-CM | POA: Diagnosis not present

## 2024-03-02 DIAGNOSIS — M48061 Spinal stenosis, lumbar region without neurogenic claudication: Secondary | ICD-10-CM | POA: Diagnosis not present

## 2024-04-04 DIAGNOSIS — Z23 Encounter for immunization: Secondary | ICD-10-CM | POA: Diagnosis not present

## 2024-04-06 ENCOUNTER — Ambulatory Visit
Admission: RE | Admit: 2024-04-06 | Discharge: 2024-04-06 | Disposition: A | Discharge status: Home / Self Care | Source: Ambulatory Visit | Attending: Internal Medicine | Admitting: Internal Medicine

## 2024-04-06 DIAGNOSIS — Z1231 Encounter for screening mammogram for malignant neoplasm of breast: Secondary | ICD-10-CM

## 2024-04-12 DIAGNOSIS — Z23 Encounter for immunization: Secondary | ICD-10-CM | POA: Diagnosis not present

## 2024-05-09 DIAGNOSIS — L820 Inflamed seborrheic keratosis: Secondary | ICD-10-CM | POA: Diagnosis not present

## 2024-05-09 DIAGNOSIS — Z419 Encounter for procedure for purposes other than remedying health state, unspecified: Secondary | ICD-10-CM | POA: Diagnosis not present

## 2024-06-06 DIAGNOSIS — R82998 Other abnormal findings in urine: Secondary | ICD-10-CM | POA: Diagnosis not present
